# Patient Record
Sex: Female | Born: 1950 | Race: White | Hispanic: No | Marital: Married | Smoking: Former smoker
Health system: Southern US, Community
[De-identification: ages and names within clinical notes are randomized; demographics above are authoritative.]

## PROBLEM LIST (undated history)

## (undated) DIAGNOSIS — K219 Gastro-esophageal reflux disease without esophagitis: Secondary | ICD-10-CM

## (undated) DIAGNOSIS — E785 Hyperlipidemia, unspecified: Secondary | ICD-10-CM

## (undated) DIAGNOSIS — M810 Age-related osteoporosis without current pathological fracture: Secondary | ICD-10-CM

## (undated) DIAGNOSIS — E079 Disorder of thyroid, unspecified: Secondary | ICD-10-CM

## (undated) HISTORY — DX: Gastro-esophageal reflux disease without esophagitis: K21.9

## (undated) HISTORY — DX: Age-related osteoporosis without current pathological fracture: M81.0

## (undated) HISTORY — DX: Disorder of thyroid, unspecified: E07.9

## (undated) HISTORY — PX: HAND SURGERY: SHX662

## (undated) HISTORY — DX: Hyperlipidemia, unspecified: E78.5

---

## 2004-05-20 LAB — HM COLONOSCOPY

## 2007-09-09 ENCOUNTER — Ambulatory Visit: Payer: Self-pay | Admitting: Gastroenterology

## 2012-05-20 LAB — HM PAP SMEAR: HM Pap smear: NORMAL

## 2012-11-24 LAB — HM DEXA SCAN

## 2013-02-10 DIAGNOSIS — G5600 Carpal tunnel syndrome, unspecified upper limb: Secondary | ICD-10-CM | POA: Insufficient documentation

## 2013-02-10 DIAGNOSIS — R7989 Other specified abnormal findings of blood chemistry: Secondary | ICD-10-CM | POA: Insufficient documentation

## 2013-05-20 ENCOUNTER — Ambulatory Visit (INDEPENDENT_AMBULATORY_CARE_PROVIDER_SITE_OTHER): Payer: BC Managed Care – PPO | Admitting: Internal Medicine

## 2013-05-20 ENCOUNTER — Encounter: Payer: Self-pay | Admitting: Internal Medicine

## 2013-05-20 VITALS — BP 110/70 | HR 68 | Temp 98.1°F | Ht 66.0 in | Wt 143.0 lb

## 2013-05-20 DIAGNOSIS — E039 Hypothyroidism, unspecified: Secondary | ICD-10-CM

## 2013-05-20 DIAGNOSIS — E041 Nontoxic single thyroid nodule: Secondary | ICD-10-CM

## 2013-05-20 DIAGNOSIS — M810 Age-related osteoporosis without current pathological fracture: Secondary | ICD-10-CM | POA: Insufficient documentation

## 2013-05-20 DIAGNOSIS — Z23 Encounter for immunization: Secondary | ICD-10-CM

## 2013-05-20 DIAGNOSIS — E785 Hyperlipidemia, unspecified: Secondary | ICD-10-CM | POA: Insufficient documentation

## 2013-05-20 LAB — COMPREHENSIVE METABOLIC PANEL
ALT: 26 U/L (ref 0–35)
Alkaline Phosphatase: 37 U/L — ABNORMAL LOW (ref 39–117)
CO2: 26 mEq/L (ref 19–32)
Calcium: 9.8 mg/dL (ref 8.4–10.5)
Creatinine, Ser: 0.8 mg/dL (ref 0.4–1.2)
GFR: 81.89 mL/min (ref >60.00)
Glucose, Bld: 84 mg/dL (ref 70–99)
Sodium: 135 mEq/L (ref 135–145)
Total Bilirubin: 1.1 mg/dL (ref 0.3–1.2)
Total Protein: 7.4 g/dL (ref 6.0–8.3)

## 2013-05-20 LAB — T4, FREE: Free T4: 0.65 ng/dL (ref 0.60–1.60)

## 2013-05-20 LAB — LIPID PANEL
Cholesterol: 162 mg/dL (ref 0–200)
Total CHOL/HDL Ratio: 2
VLDL: 14.8 mg/dL (ref 0.0–40.0)

## 2013-05-20 LAB — TSH: TSH: 7.73 u[IU]/mL — ABNORMAL HIGH (ref 0.35–5.50)

## 2013-05-20 MED ORDER — ALENDRONATE SODIUM 70 MG PO TABS: 70.0000 mg | ORAL_TABLET | ORAL | Status: DC

## 2013-05-20 NOTE — Assessment & Plan Note (Signed)
Reviewed previous lipids from [facility] July 2014. Discussed current cholesterol guidelines including Mediterranean style diet and regular aerobic exercise 40 minutes 3 times per week. Given that she has been stable and doing well on simvastatin 40 mg daily, encouraged her to continue on this medication. Will recheck LFTs and lipids with labs today.

## 2013-05-20 NOTE — Assessment & Plan Note (Signed)
Reviewed previous TSH values from [facility] showing TSH between 6 and 10. Patient currently asymptomatic. Will recheck TSH with labs today.

## 2013-05-20 NOTE — Assessment & Plan Note (Signed)
Palpable thyroid nodule noted on exam today. Will get ultrasound for further evaluation.

## 2013-05-20 NOTE — Progress Notes (Signed)
  Subjective:    Patient ID: Shannon [name], female    DOB: October 02, 1950, 62 y.o.   MRN: [id number]  HPI 62 year old female with history of hyperlipidemia and osteoporosis presents to establish care. She reports that she is generally feeling well. She tries to follow a healthy diet and has been exercising by walking. She questions whether she needs to stay on simvastatin. Review of recent labs performed in July 2014 through [facility] showed cholesterol well controlled with total cholesterol less than 200. She denies any side effects on simvastatin.  She also notes a history of osteoporosis based on bone density testing in 2013. She was told to start on Boniva but has not yet filled this medication. She is taking a calcium and vitamin D supplement. She has never had fracture.  No outpatient prescriptions prior to visit.   No facility-administered medications prior to visit.     BP 110/70  Pulse 68  Temp(Src) 98.1 F (36.7 C) (Oral)  Ht 5\' 6"  (1.676 m)  Wt 143 lb (64.864 kg)  BMI 23.09 kg/m2  SpO2 97%  Review of Systems  Constitutional: Negative for fever, chills, appetite change, fatigue and unexpected weight change.  HENT: Negative for congestion, ear pain, sinus pressure, sore throat, trouble swallowing and voice change.   Eyes: Negative for visual disturbance.  Respiratory: Negative for cough, shortness of breath, wheezing and stridor.   Cardiovascular: Negative for chest pain, palpitations and leg swelling.  Gastrointestinal: Negative for nausea, vomiting, abdominal pain, diarrhea, constipation, blood in stool, abdominal distention and anal bleeding.  Genitourinary: Negative for dysuria and flank pain.  Musculoskeletal: Negative for arthralgias, gait problem, myalgias and neck pain.  Skin: Negative for color change and rash.  Neurological: Negative for dizziness and headaches.  Hematological: Negative for adenopathy. Does not bruise/bleed easily.  Psychiatric/Behavioral:  Negative for suicidal ideas, sleep disturbance and dysphoric mood. The patient is not nervous/anxious.        Objective:   Physical Exam  Constitutional: She is oriented to person, place, and time. She appears well-developed and well-nourished. No distress.  HENT:  Head: Normocephalic and atraumatic.  Right Ear: External ear normal.  Left Ear: External ear normal.  Nose: Nose normal.  Mouth/Throat: Oropharynx is clear and moist. No oropharyngeal exudate.  Eyes: Conjunctivae are normal. Pupils are equal, round, and reactive to light. Right eye exhibits no discharge. Left eye exhibits no discharge. No scleral icterus.  Neck: Normal range of motion. Neck supple. No tracheal deviation present. Mass present. No thyromegaly present.    Cardiovascular: Normal rate, regular rhythm, normal heart sounds and intact distal pulses.  Exam reveals no gallop and no friction rub.   No murmur heard. Pulmonary/Chest: Effort normal and breath sounds normal. No accessory muscle usage. Not tachypneic. No respiratory distress. She has no decreased breath sounds. She has no wheezes. She has no rhonchi. She has no rales. She exhibits no tenderness.  Musculoskeletal: Normal range of motion. She exhibits no edema and no tenderness.  Lymphadenopathy:    She has no cervical adenopathy.  Neurological: She is alert and oriented to person, place, and time. No cranial nerve deficit. She exhibits normal muscle tone. Coordination normal.  Skin: Skin is warm and dry. No rash noted. She is not diaphoretic. No erythema. No pallor.  Psychiatric: She has a normal mood and affect. Her behavior is normal. Judgment and thought content normal.          Assessment & Plan:

## 2013-05-20 NOTE — Assessment & Plan Note (Addendum)
Reviewed bone density testing from 2013 from [facility]. T score -2.5. Reviewed current recommendations regarding treatment of osteoporosis. Discussed getting adequate dietary calcium. Will check vitamin D level with labs today. Encouraged weight-bearing physical activity such as walking. Recommended starting alendronate 70 mg weekly. Discussed potential risk and benefits of this medication. Plan to repeat bone density testing in 2016.

## 2013-05-21 LAB — VITAMIN D 25 HYDROXY (VIT D DEFICIENCY, FRACTURES): Vit D, 25-Hydroxy: 51 ng/mL (ref 30–89)

## 2013-05-25 ENCOUNTER — Encounter: Payer: Self-pay | Admitting: Internal Medicine

## 2013-05-25 ENCOUNTER — Telehealth: Payer: Self-pay | Admitting: Internal Medicine

## 2013-05-25 DIAGNOSIS — E042 Nontoxic multinodular goiter: Secondary | ICD-10-CM

## 2013-05-25 NOTE — Telephone Encounter (Signed)
Thyroid US showed multinodular goiter with largest nodule left 2.5x1.4x2.4cm.

## 2013-05-27 ENCOUNTER — Ambulatory Visit (INDEPENDENT_AMBULATORY_CARE_PROVIDER_SITE_OTHER): Payer: BC Managed Care – PPO | Admitting: Internal Medicine

## 2013-05-27 ENCOUNTER — Encounter: Payer: Self-pay | Admitting: Internal Medicine

## 2013-05-27 VITALS — BP 108/64 | HR 69 | Temp 98.5°F | Resp 12 | Ht 66.0 in | Wt 143.0 lb

## 2013-05-27 DIAGNOSIS — E041 Nontoxic single thyroid nodule: Secondary | ICD-10-CM

## 2013-05-27 NOTE — Progress Notes (Addendum)
Patient ID: Shannon [name], female   DOB: 1950/09/21, 62 y.o.   MRN: [id number]  HPI  Shannon [name] is a 62 y.o.-year-old female, referred by her PCP, Dr.Walker, for evaluation for MNG.  Pt's PCP felt a right sided nodule at last visit >> Thyroid U/S (05/22/2013): heterogeneous gland, multinodular goiter with largest nodule in left lobe; 2.5 x 1.4 x 2.4 cm, solid, slightly echogenic, moderate internal vascularity, no calcifications. Largest nodule on the right: 1.4 x 0.9 x 1.0 cm, predominantly  isoechoic, with mild internal vascularity. No calcifications.   She also has a h/o hypothyroidism for the last at least 11 years ago >> started on levoxyl then >> but stopped subsequently as she did not feel different. Recent TSH levels from [facility] b/w 6-10 (per PCP note) and recent TSH at 7.7, with normal fT4.  I reviewed pt's most recent  thyroid tests: Lab Results  Component Value Date   TSH 7.73* 05/20/2013   FREET4 0.65 05/20/2013    Pt denies feeling nodules in neck, hoarseness, dysphagia/odynophagia, SOB with lying down.  Pt c/o: - no heat intolerance/+ cold intolerance - no tremors - no palpitations - no hyperdefecation/+ constipation - weight loss - weight gain - + very dry skin - + hair falling - no problems with concentration - no fatigue - no anxiety/no depression  No FH of thyroid ds. No FH of thyroid cancer. No h/o radiation tx to head or neck.  No seaweed or kelp, no recent contrast studies. No steroid use. No herbal supplements.   I reviewed her chart and she also has a history of osteoporosis - on Alendronate, HL - on Zocor.  ROS: Constitutional: no weight gain/loss, no fatigue, no subjective hyperthermia/hypothermia Eyes: no blurry vision, no xerophthalmia ENT: no sore throat, no nodules palpated in throat, no dysphagia/odynophagia, no hoarseness Cardiovascular: no CP/SOB/palpitations/leg swelling Respiratory: no cough/SOB Gastrointestinal: no N/V/D/C Musculoskeletal:  no muscle/joint aches Skin: no rashes Neurological: no tremors/numbness/tingling/dizziness Psychiatric: no depression/anxiety  Past Medical History  Diagnosis Date  . GERD (gastroesophageal reflux disease)   . Hyperlipidemia   . Thyroid disease    History reviewed. No pertinent past surgical history. History   Social History  . Marital Status: Married    Spouse Name: N/A    Number of Children: 1   Occupational History  . retail.   Social History Main Topics  . Smoking status: Former Smoker    Types: Cigarettes    Quit date: 01/18/1989  . Smokeless tobacco: Never Used  . Alcohol Use: 4.2 oz/week    7 Glasses of wine per week  . Drug Use: No  . Sexual Activity: Yes    Partners: Male   Social History Narrative   Lives in [place] with husband. 23YO son, lives in [place], Consulting civil engineer. Dog in home. Born in [place], raised in [place]. College [place]. Previously lived in [place].      Diet - regular diet      Exercise - walking   Caffeine: coffee in the morning; cup of hot tea      Hobbies - yardwork      Work - [employer] in [place]   Current Outpatient Prescriptions on File Prior to Visit  Medication Sig Dispense Refill  . alendronate (FOSAMAX) 70 MG tablet Take 1 tablet (70 mg total) by mouth every 7 (seven) days. Take with a full glass of water on an empty stomach.  4 tablet  11  . simvastatin (ZOCOR) 40 MG tablet Take 40 mg by  mouth every evening.       No current facility-administered medications on file prior to visit.   No Known Allergies Family History  Problem Relation Age of Onset  . Cancer Mother 1    Breast cancer  . Heart disease Father   . Cancer Father     Prostate  . Diabetes Father     diagnosed later in life  . Asthma Brother    PE: BP 108/64  Pulse 69  Temp(Src) 98.5 F (36.9 C) (Oral)  Resp 12  Ht 5\' 6"  (1.676 m)  Wt 143 lb (64.864 kg)  BMI 23.09 kg/m2  SpO2 97% Wt Readings from Last 3 Encounters:  05/27/13 143 lb (64.864 kg)  05/20/13 143  lb (64.864 kg)   Constitutional: overweight, in NAD Eyes: PERRLA, EOMI, no exophthalmos ENT: moist mucous membranes, tonsillomegaly, no thyromegaly, palpable R thyroid nodule on R no cervical lymphadenopathy Cardiovascular: RRR, No MRG Respiratory: CTA B Gastrointestinal: abdomen soft, NT, ND, BS+ Musculoskeletal: no deformities, strength intact in all 4;  Skin: moist, warm, no rashes Neurological: no tremor with outstretched hands, DTR normal in all 4  ASSESSMENT: 1. MNG - Thyroid U/S (05/22/2013): heterogeneous gland, multinodular goiter with largest nodule in left lobe; 2.5 x 1.4 x 2.4 cm, solid, slightly echogenic, moderate internal vascularity, no calcifications. Largest nodule on the right: 1.4 x 0.9 x 1.0 cm, predominantly  isoechoic, with mild internal vascularity. No calcifications.   1. MNG  - I reviewed the report of her thyroid ultrasound along with the patient. I pointed out that the dominant nodules are large, this being a risk factor for cancer. The TSh being higher than normal for years is also a risk for cancer. Otherwise, the nodules are iso- or hyperechoic, without calcifications, with mild-moderate internal blood flow, more wide than tall. Pt does not have a thyroid cancer family history or a personal history of RxTx to head/neck. All these would favor benignity. I believe her risk of cancer is less than 10-15%.  - the only way that we can tell exactly if it is cancer or not is by doing a thyroid biopsy (FNA). I explained what the test entails. - We discussed about other options, to wait for another year and see if the nodule grows, and only intervene at that time.  - I explained that this is not cancer, we can continue to follow her on a yearly basis, and check another ultrasound in another year or 2. - patient would like to think about the plan and let me know her decision after d/w husband - we discussed about the fact that a high TSH would stimulate nodule growth and we  decided to wait and recheck TSH at 6 months from the last check and start Levothyroxine if TSH higher. This can be done I would recommend a TSH in the normal range. - I'll see her back in a year, assuming her FNA is normal. If FNA abnormal, we will meet sooner.   06/12/2013 Adequacy Reason Satisfactory For Evaluation. Diagnosis THYROID, FINE NEEDLE ASPIRATION LEFT, (SPECIMEN 1 OF 2, COLLECTED ON 06/11/2013). 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 Jimmy Picket MD Pathologist, Electronic Signature (Case signed 06/12/2013) Specimen Clinical Information Nontoxic uninodular goiter, nodule, 2.5 x 1.4 x 2.4cm dominant left nodule   Adequacy Reason Satisfactory For Evaluation. Diagnosis THYROID, FINE NEEDLE ASPIRATION RIGHT, (SPECIMEN 2 OF 2, COLLECTED ON 11/20 2014). BENIGN. FINDINGS CONSISTENT WITH GOITER. FINDINGS CONSISTENT WITH THE CONTENTS OF A CYST. 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 This is not an entity described in the Bethesda criteria. I am not sure how to interpret this. The treatment plan for a follicular lesion would be to repeat FNA, while for follicular neoplasm would be surgical lobectomy.   I would discuss with the pathologist and then with the patient.

## 2013-05-27 NOTE — Patient Instructions (Signed)
Please return in 1 year. Please let me know about your decision for or against the FNA (fine needle aspiration) of your thyroid nodules.

## 2013-06-08 ENCOUNTER — Encounter: Payer: Self-pay | Admitting: Internal Medicine

## 2013-06-09 ENCOUNTER — Other Ambulatory Visit: Payer: Self-pay | Admitting: Internal Medicine

## 2013-06-09 ENCOUNTER — Encounter: Payer: Self-pay | Admitting: Internal Medicine

## 2013-06-09 DIAGNOSIS — E041 Nontoxic single thyroid nodule: Secondary | ICD-10-CM

## 2013-06-11 ENCOUNTER — Ambulatory Visit
Admission: RE | Admit: 2013-06-11 | Discharge: 2013-06-11 | Disposition: A | Discharge status: Home / Self Care | Payer: BC Managed Care – PPO | Source: Ambulatory Visit | Attending: Internal Medicine | Admitting: Internal Medicine

## 2013-06-11 ENCOUNTER — Other Ambulatory Visit (HOSPITAL_COMMUNITY)
Admission: RE | Admit: 2013-06-11 | Discharge: 2013-06-11 | Disposition: A | Discharge status: Home / Self Care | Payer: BC Managed Care – PPO | Source: Ambulatory Visit | Attending: Interventional Radiology | Admitting: Interventional Radiology

## 2013-06-11 ENCOUNTER — Encounter: Payer: Self-pay | Admitting: Internal Medicine

## 2013-06-11 DIAGNOSIS — M810 Age-related osteoporosis without current pathological fracture: Secondary | ICD-10-CM

## 2013-06-11 DIAGNOSIS — E041 Nontoxic single thyroid nodule: Secondary | ICD-10-CM | POA: Insufficient documentation

## 2013-06-11 NOTE — Progress Notes (Signed)
Pt had some discomfort/pain post thyroid FNA.  Went ahead and gave pt 500mg  tab po Tylenol at 950am.    Tammy, EMT 9:56am

## 2013-06-12 MED ORDER — SIMVASTATIN 40 MG PO TABS: 40.0000 mg | ORAL_TABLET | Freq: Every evening | ORAL | Status: DC

## 2013-06-12 MED ORDER — ALENDRONATE SODIUM 70 MG PO TABS: 70.0000 mg | ORAL_TABLET | ORAL | Status: DC

## 2013-06-15 ENCOUNTER — Other Ambulatory Visit: Payer: Self-pay | Admitting: Internal Medicine

## 2013-06-15 ENCOUNTER — Telehealth: Payer: Self-pay | Admitting: *Deleted

## 2013-06-15 ENCOUNTER — Encounter: Payer: Self-pay | Admitting: Internal Medicine

## 2013-06-15 DIAGNOSIS — E039 Hypothyroidism, unspecified: Secondary | ICD-10-CM

## 2013-06-15 NOTE — Telephone Encounter (Signed)
Yes, I called her earlier to discuss results of FNA.  I called her back now, explained that one of the thyroid nodules Bx's were indeterminate, but per pathologist (Dr Jimmy Picket) opinion, this was lower risk. I recommended that she comes for another appt in 09-05/2014 and we will need a new thyroid U/S and FNA then. She will also return in 11/2012 for TFTs and if TSH still high >> will start Levothyroxine then.

## 2013-06-15 NOTE — Telephone Encounter (Signed)
Pt called and lvm stating she was returning a call from our number. Pt asked for a return call, [phone]. Thank you.

## 2013-07-06 ENCOUNTER — Encounter: Payer: Self-pay | Admitting: Internal Medicine

## 2013-07-06 MED ORDER — POLYMYXIN B-TRIMETHOPRIM 10000-0.1 UNIT/ML-% OP SOLN: 1.0000 [drp] | Freq: Four times a day (QID) | OPHTHALMIC | Status: DC

## 2013-07-18 ENCOUNTER — Encounter: Payer: Self-pay | Admitting: Internal Medicine

## 2013-09-07 ENCOUNTER — Encounter: Payer: Self-pay | Admitting: Internal Medicine

## 2013-09-07 MED ORDER — CIPROFLOXACIN HCL 500 MG PO TABS: 500.0000 mg | ORAL_TABLET | Freq: Two times a day (BID) | ORAL | Status: DC

## 2013-09-08 NOTE — Telephone Encounter (Signed)
Per Phineas Real - sent to me by mistake.  Dr Gilford Rile has already adressed.

## 2013-11-09 ENCOUNTER — Ambulatory Visit: Payer: BC Managed Care – PPO | Admitting: Internal Medicine

## 2013-11-09 ENCOUNTER — Ambulatory Visit (INDEPENDENT_AMBULATORY_CARE_PROVIDER_SITE_OTHER): Payer: BC Managed Care – PPO | Admitting: Internal Medicine

## 2013-11-09 ENCOUNTER — Encounter: Payer: Self-pay | Admitting: Internal Medicine

## 2013-11-09 VITALS — BP 108/78 | HR 71 | Temp 98.4°F | Wt 139.0 lb

## 2013-11-09 DIAGNOSIS — R3915 Urgency of urination: Secondary | ICD-10-CM

## 2013-11-09 DIAGNOSIS — N390 Urinary tract infection, site not specified: Secondary | ICD-10-CM

## 2013-11-09 LAB — POCT URINALYSIS DIPSTICK
Bilirubin, UA: NEGATIVE
GLUCOSE UA: NEGATIVE
KETONES UA: NEGATIVE
Nitrite, UA: NEGATIVE
PH UA: 7
Spec Grav, UA: 1.02
Urobilinogen, UA: 0.2

## 2013-11-09 MED ORDER — CIPROFLOXACIN HCL 500 MG PO TABS: 500.0000 mg | ORAL_TABLET | Freq: Two times a day (BID) | ORAL | Status: DC

## 2013-11-09 NOTE — Patient Instructions (Signed)
Urinary Tract Infection  Urinary tract infections (UTIs) can develop anywhere along your urinary tract. Your urinary tract is your body's drainage system for removing wastes and extra water. Your urinary tract includes two kidneys, two ureters, a bladder, and a urethra. Your kidneys are a pair of bean-shaped organs. Each kidney is about the size of your fist. They are located below your ribs, one on each side of your spine.  CAUSES  Infections are caused by microbes, which are microscopic organisms, including fungi, viruses, and bacteria. These organisms are so small that they can only be seen through a microscope. Bacteria are the microbes that most commonly cause UTIs.  SYMPTOMS   Symptoms of UTIs may vary by age and gender of the patient and by the location of the infection. Symptoms in young women typically include a frequent and intense urge to urinate and a painful, burning feeling in the bladder or urethra during urination. Older women and men are more likely to be tired, shaky, and weak and have muscle aches and abdominal pain. A fever may mean the infection is in your kidneys. Other symptoms of a kidney infection include pain in your back or sides below the ribs, nausea, and vomiting.  DIAGNOSIS  To diagnose a UTI, your caregiver will ask you about your symptoms. Your caregiver also will ask to provide a urine sample. The urine sample will be tested for bacteria and white blood cells. White blood cells are made by your body to help fight infection.  TREATMENT   Typically, UTIs can be treated with medication. Because most UTIs are caused by a bacterial infection, they usually can be treated with the use of antibiotics. The choice of antibiotic and length of treatment depend on your symptoms and the type of bacteria causing your infection.  HOME CARE INSTRUCTIONS   If you were prescribed antibiotics, take them exactly as your caregiver instructs you. Finish the medication even if you feel better after you  have only taken some of the medication.   Drink enough water and fluids to keep your urine clear or pale yellow.   Avoid caffeine, tea, and carbonated beverages. They tend to irritate your bladder.   Empty your bladder often. Avoid holding urine for long periods of time.   Empty your bladder before and after sexual intercourse.   After a bowel movement, women should cleanse from front to back. Use each tissue only once.  SEEK MEDICAL CARE IF:    You have back pain.   You develop a fever.   Your symptoms do not begin to resolve within 3 days.  SEEK IMMEDIATE MEDICAL CARE IF:    You have severe back pain or lower abdominal pain.   You develop chills.   You have nausea or vomiting.   You have continued burning or discomfort with urination.  MAKE SURE YOU:    Understand these instructions.   Will watch your condition.   Will get help right away if you are not doing well or get worse.  Document Released: 04/18/2005 Document Revised: 01/08/2012 Document Reviewed: 08/17/2011  Exit[employer] Patient Information 2014 [employer].

## 2013-11-09 NOTE — Progress Notes (Signed)
   Subjective:    Patient ID: Shannon [name], female    DOB: March 13, 1951, 63 y.o.   MRN: [id number]  HPI 63YO female presents for acute visit. Started having urinary urgency and dysuria this morning. Had intercourse this weekend. No fever, chills, gross hematuria.  Review of Systems  Constitutional: Negative for fever, chills and fatigue.  Gastrointestinal: Negative for nausea, vomiting, abdominal pain, diarrhea, constipation and rectal pain.  Genitourinary: Positive for dysuria, urgency and frequency. Negative for hematuria, flank pain, decreased urine volume, vaginal bleeding, vaginal discharge, difficulty urinating, vaginal pain and pelvic pain.       Objective:    BP 108/78  Pulse 71  Temp(Src) 98.4 F (36.9 C) (Oral)  Wt 139 lb (63.05 kg)  SpO2 97% Physical Exam  Constitutional: She is oriented to person, place, and time. She appears well-developed and well-nourished. No distress.  HENT:  Head: Normocephalic and atraumatic.  Right Ear: External ear normal.  Left Ear: External ear normal.  Nose: Nose normal.  Mouth/Throat: Oropharynx is clear and moist. No oropharyngeal exudate.  Eyes: Conjunctivae are normal. Pupils are equal, round, and reactive to light. Right eye exhibits no discharge. Left eye exhibits no discharge. No scleral icterus.  Neck: Normal range of motion. Neck supple. No tracheal deviation present. No thyromegaly present.  Cardiovascular: Normal rate, regular rhythm, normal heart sounds and intact distal pulses.  Exam reveals no gallop and no friction rub.   No murmur heard. Pulmonary/Chest: Effort normal and breath sounds normal. No accessory muscle usage. Not tachypneic. No respiratory distress. She has no decreased breath sounds. She has no wheezes. She has no rhonchi. She has no rales. She exhibits no tenderness.  Abdominal: There is no tenderness (no CVA tenderness).  Musculoskeletal: Normal range of motion. She exhibits no edema and no tenderness.    Lymphadenopathy:    She has no cervical adenopathy.  Neurological: She is alert and oriented to person, place, and time. No cranial nerve deficit. She exhibits normal muscle tone. Coordination normal.  Skin: Skin is warm and dry. No rash noted. She is not diaphoretic. No erythema. No pallor.  Psychiatric: She has a normal mood and affect. Her behavior is normal. Judgment and thought content normal.          Assessment & Plan:   Problem List Items Addressed This Visit   UTI (urinary tract infection) - Primary     Symptoms and exam consistent with UTI. Will start Cipro. Will send urine for culture. Azo prn pain. Follow up if symptoms are not improving over next 48hr.    Relevant Medications      ciprofloxacin (CIPRO) tablet    Other Visit Diagnoses   Urinary urgency        Relevant Orders       POCT Urinalysis Dipstick (Completed)       Urine culture        Return if symptoms worsen or fail to improve.

## 2013-11-09 NOTE — Assessment & Plan Note (Signed)
Symptoms and exam consistent with UTI. Will start Cipro. Will send urine for culture. Azo prn pain. Follow up if symptoms are not improving over next 48hr.

## 2013-11-09 NOTE — Progress Notes (Signed)
Pre visit review using our clinic review tool, if applicable. No additional management support is needed unless otherwise documented below in the visit note. 

## 2013-11-10 LAB — URINE CULTURE
COLONY COUNT: NO GROWTH
ORGANISM ID, BACTERIA: NO GROWTH

## 2013-11-24 ENCOUNTER — Encounter: Payer: Self-pay | Admitting: Internal Medicine

## 2013-11-24 ENCOUNTER — Ambulatory Visit (INDEPENDENT_AMBULATORY_CARE_PROVIDER_SITE_OTHER): Payer: BC Managed Care – PPO | Admitting: Internal Medicine

## 2013-11-24 VITALS — BP 102/62 | HR 67 | Temp 97.8°F | Resp 14 | Ht 66.0 in | Wt 139.5 lb

## 2013-11-24 DIAGNOSIS — E041 Nontoxic single thyroid nodule: Secondary | ICD-10-CM

## 2013-11-24 DIAGNOSIS — Z0001 Encounter for general adult medical examination with abnormal findings: Secondary | ICD-10-CM | POA: Insufficient documentation

## 2013-11-24 DIAGNOSIS — Z1239 Encounter for other screening for malignant neoplasm of breast: Secondary | ICD-10-CM

## 2013-11-24 DIAGNOSIS — Z0000 Encounter for general adult medical examination without abnormal findings: Secondary | ICD-10-CM | POA: Insufficient documentation

## 2013-11-24 LAB — CBC WITH DIFFERENTIAL/PLATELET
BASOS ABS: 0 10*3/uL (ref 0.0–0.1)
Basophils Relative: 0.5 % (ref 0.0–3.0)
Eosinophils Absolute: 0.2 10*3/uL (ref 0.0–0.7)
Eosinophils Relative: 3.3 % (ref 0.0–5.0)
HCT: 36.6 % (ref 36.0–46.0)
Hemoglobin: 12.6 g/dL (ref 12.0–15.0)
Lymphocytes Relative: 20.3 % (ref 12.0–46.0)
Lymphs Abs: 1.1 10*3/uL (ref 0.7–4.0)
MCHC: 34.3 g/dL (ref 30.0–36.0)
MCV: 98.2 fl (ref 78.0–100.0)
MONOS PCT: 8.6 % (ref 3.0–12.0)
Monocytes Absolute: 0.5 10*3/uL (ref 0.1–1.0)
NEUTROS PCT: 67.3 % (ref 43.0–77.0)
Neutro Abs: 3.6 10*3/uL (ref 1.4–7.7)
PLATELETS: 268 10*3/uL (ref 150.0–400.0)
RBC: 3.73 Mil/uL — ABNORMAL LOW (ref 3.87–5.11)
RDW: 14 % (ref 11.5–15.5)
WBC: 5.3 10*3/uL (ref 4.0–10.5)

## 2013-11-24 LAB — COMPREHENSIVE METABOLIC PANEL
ALBUMIN: 4.3 g/dL (ref 3.5–5.2)
ALK PHOS: 33 U/L — AB (ref 39–117)
ALT: 28 U/L (ref 0–35)
AST: 37 U/L (ref 0–37)
BILIRUBIN TOTAL: 0.9 mg/dL (ref 0.2–1.2)
BUN: 20 mg/dL (ref 6–23)
CO2: 26 mEq/L (ref 19–32)
Calcium: 9.4 mg/dL (ref 8.4–10.5)
Chloride: 103 mEq/L (ref 96–112)
Creatinine, Ser: 0.8 mg/dL (ref 0.4–1.2)
GFR: 83.01 mL/min (ref >60.00)
GLUCOSE: 78 mg/dL (ref 70–99)
POTASSIUM: 4.7 meq/L (ref 3.5–5.1)
SODIUM: 137 meq/L (ref 135–145)
TOTAL PROTEIN: 6.9 g/dL (ref 6.0–8.3)

## 2013-11-24 LAB — T4, FREE: FREE T4: 0.61 ng/dL (ref 0.60–1.60)

## 2013-11-24 LAB — LIPID PANEL
CHOLESTEROL: 155 mg/dL (ref 0–200)
HDL: 68.5 mg/dL (ref >39.00)
LDL Cholesterol: 72 mg/dL (ref 0–99)
Total CHOL/HDL Ratio: 2
Triglycerides: 73 mg/dL (ref 0.0–149.0)
VLDL: 14.6 mg/dL (ref 0.0–40.0)

## 2013-11-24 LAB — T3, FREE: T3, Free: 2.9 pg/mL (ref 2.3–4.2)

## 2013-11-24 LAB — TSH: TSH: 8.35 u[IU]/mL — ABNORMAL HIGH (ref 0.35–4.50)

## 2013-11-24 LAB — MICROALBUMIN / CREATININE URINE RATIO
CREATININE, U: 96.4 mg/dL
MICROALB UR: 2.1 mg/dL — AB (ref 0.0–1.9)
MICROALB/CREAT RATIO: 2.2 mg/g (ref 0.0–30.0)

## 2013-11-24 MED ORDER — SIMVASTATIN 40 MG PO TABS: 40.0000 mg | ORAL_TABLET | Freq: Every evening | ORAL | Status: DC

## 2013-11-24 NOTE — Progress Notes (Signed)
Subjective:    Patient ID: Shannon [name], female    DOB: 1950/08/05, 63 y.o.   MRN: [id number]  HPI 63YO female presents for annual exam. Doing well. No concerns today. Trying to follow a healthy, Mediterranean style diet. Stays physically active. Due for mammogram.   Review of Systems  Constitutional: Negative for fever, chills, appetite change, fatigue and unexpected weight change.  HENT: Negative for congestion, ear pain, sinus pressure, sore throat, trouble swallowing and voice change.   Eyes: Negative for visual disturbance.  Respiratory: Negative for cough, shortness of breath, wheezing and stridor.   Cardiovascular: Negative for chest pain, palpitations and leg swelling.  Gastrointestinal: Negative for nausea, vomiting, abdominal pain, diarrhea, constipation, blood in stool, abdominal distention and anal bleeding.  Genitourinary: Negative for dysuria and flank pain.  Musculoskeletal: Negative for arthralgias, gait problem, myalgias and neck pain.  Skin: Negative for color change and rash.  Neurological: Negative for dizziness and headaches.  Hematological: Negative for adenopathy. Does not bruise/bleed easily.  Psychiatric/Behavioral: Negative for suicidal ideas, sleep disturbance and dysphoric mood. The patient is not nervous/anxious.        Objective:    BP 102/62  Pulse 67  Temp(Src) 97.8 F (36.6 C) (Oral)  Resp 14  Ht 5\' 6"  (1.676 m)  Wt 139 lb 8 oz (63.277 kg)  BMI 22.53 kg/m2  SpO2 97% Physical Exam  Constitutional: She is oriented to person, place, and time. She appears well-developed and well-nourished. No distress.  HENT:  Head: Normocephalic and atraumatic.  Right Ear: External ear normal.  Left Ear: External ear normal.  Nose: Nose normal.  Mouth/Throat: Oropharynx is clear and moist. No oropharyngeal exudate.  Eyes: Conjunctivae are normal. Pupils are equal, round, and reactive to light. Right eye exhibits no discharge. Left eye exhibits no discharge.  No scleral icterus.  Neck: Normal range of motion. Neck supple. No tracheal deviation present. No thyromegaly present.  Cardiovascular: Normal rate, regular rhythm, normal heart sounds and intact distal pulses.  Exam reveals no gallop and no friction rub.   No murmur heard. Pulmonary/Chest: Effort normal and breath sounds normal. No accessory muscle usage. Not tachypneic. No respiratory distress. She has no decreased breath sounds. She has no wheezes. She has no rales. She exhibits no tenderness. Right breast exhibits no inverted nipple, no mass, no nipple discharge, no skin change and no tenderness. Left breast exhibits no inverted nipple, no mass, no nipple discharge, no skin change and no tenderness. Breasts are symmetrical.  Abdominal: Soft. Bowel sounds are normal. She exhibits no distension and no mass. There is no tenderness. There is no rebound and no guarding.  Musculoskeletal: Normal range of motion. She exhibits no edema and no tenderness.  Lymphadenopathy:    She has no cervical adenopathy.  Neurological: She is alert and oriented to person, place, and time. No cranial nerve deficit. She exhibits normal muscle tone. Coordination normal.  Skin: Skin is warm and dry. No rash noted. She is not diaphoretic. No erythema. No pallor.  Psychiatric: She has a normal mood and affect. Her behavior is normal. Judgment and thought content normal.          Assessment & Plan:   Problem List Items Addressed This Visit   Routine general medical examination at a health care facility - Primary     General medical exam normal today including breast exam. PAP and pelvic deferred as PAP normal 2013. Will plan repeat PAP in 2016. Mammogram ordered. Colonoscopy UTD. Immunizations UTD. Labs  today including CBC, CMP, lipids, TSH.    Relevant Orders      CBC with Differential      Comprehensive metabolic panel      Lipid panel      Microalbumin / creatinine urine ratio      Vit D  25 hydroxy (rtn  osteoporosis monitoring)   Screening for breast cancer   Relevant Orders      MM Digital Screening   Thyroid nodule   Relevant Orders      TSH      T4, free      T3, free       Return in about 6 months (around 05/27/2014).

## 2013-11-24 NOTE — Progress Notes (Signed)
Pre visit review using our clinic review tool, if applicable. No additional management support is needed unless otherwise documented below in the visit note. 

## 2013-11-24 NOTE — Assessment & Plan Note (Signed)
General medical exam normal today including breast exam. PAP and pelvic deferred as PAP normal 2013. Will plan repeat PAP in 2016. Mammogram ordered. Colonoscopy UTD. Immunizations UTD. Labs today including CBC, CMP, lipids, TSH.

## 2013-11-25 ENCOUNTER — Other Ambulatory Visit: Payer: Self-pay | Admitting: Internal Medicine

## 2013-11-25 DIAGNOSIS — E039 Hypothyroidism, unspecified: Secondary | ICD-10-CM

## 2013-11-25 LAB — VITAMIN D 25 HYDROXY (VIT D DEFICIENCY, FRACTURES): Vit D, 25-Hydroxy: 39 ng/mL (ref 30–89)

## 2013-11-25 MED ORDER — LEVOTHYROXINE SODIUM 25 MCG PO TABS: 25.0000 ug | ORAL_TABLET | Freq: Every day | ORAL | Status: DC

## 2013-11-27 ENCOUNTER — Encounter: Payer: Self-pay | Admitting: *Deleted

## 2013-12-30 ENCOUNTER — Ambulatory Visit: Payer: Self-pay | Admitting: Internal Medicine

## 2013-12-30 LAB — HM MAMMOGRAPHY: HM Mammogram: NEGATIVE

## 2014-01-07 ENCOUNTER — Encounter: Payer: Self-pay | Admitting: *Deleted

## 2014-02-23 ENCOUNTER — Encounter: Payer: Self-pay | Admitting: Internal Medicine

## 2014-02-24 ENCOUNTER — Other Ambulatory Visit (INDEPENDENT_AMBULATORY_CARE_PROVIDER_SITE_OTHER): Payer: BC Managed Care – PPO

## 2014-02-24 DIAGNOSIS — E039 Hypothyroidism, unspecified: Secondary | ICD-10-CM

## 2014-02-24 LAB — T4, FREE: FREE T4: 1.18 ng/dL (ref 0.60–1.60)

## 2014-02-24 LAB — TSH: TSH: 3.3 u[IU]/mL (ref 0.35–4.50)

## 2014-02-25 ENCOUNTER — Encounter: Payer: Self-pay | Admitting: Internal Medicine

## 2014-06-02 ENCOUNTER — Ambulatory Visit (INDEPENDENT_AMBULATORY_CARE_PROVIDER_SITE_OTHER): Payer: BC Managed Care – PPO | Admitting: *Deleted

## 2014-06-02 ENCOUNTER — Encounter: Payer: Self-pay | Admitting: Internal Medicine

## 2014-06-02 ENCOUNTER — Ambulatory Visit (INDEPENDENT_AMBULATORY_CARE_PROVIDER_SITE_OTHER): Payer: BC Managed Care – PPO | Admitting: Internal Medicine

## 2014-06-02 VITALS — BP 102/62 | HR 69 | Temp 98.0°F | Ht 66.0 in | Wt 140.8 lb

## 2014-06-02 DIAGNOSIS — Z23 Encounter for immunization: Secondary | ICD-10-CM

## 2014-06-02 DIAGNOSIS — E041 Nontoxic single thyroid nodule: Secondary | ICD-10-CM

## 2014-06-02 DIAGNOSIS — E039 Hypothyroidism, unspecified: Secondary | ICD-10-CM

## 2014-06-02 DIAGNOSIS — L659 Nonscarring hair loss, unspecified: Secondary | ICD-10-CM | POA: Insufficient documentation

## 2014-06-02 DIAGNOSIS — E785 Hyperlipidemia, unspecified: Secondary | ICD-10-CM

## 2014-06-02 DIAGNOSIS — Z1211 Encounter for screening for malignant neoplasm of colon: Secondary | ICD-10-CM

## 2014-06-02 LAB — CBC WITH DIFFERENTIAL/PLATELET
Basophils Absolute: 0 10*3/uL (ref 0.0–0.1)
Basophils Relative: 0.5 % (ref 0.0–3.0)
EOS PCT: 3.2 % (ref 0.0–5.0)
Eosinophils Absolute: 0.1 10*3/uL (ref 0.0–0.7)
HCT: 38 % (ref 36.0–46.0)
Hemoglobin: 13 g/dL (ref 12.0–15.0)
Lymphocytes Relative: 26 % (ref 12.0–46.0)
Lymphs Abs: 1.2 10*3/uL (ref 0.7–4.0)
MCHC: 34.2 g/dL (ref 30.0–36.0)
MCV: 96.9 fl (ref 78.0–100.0)
MONOS PCT: 8.3 % (ref 3.0–12.0)
Monocytes Absolute: 0.4 10*3/uL (ref 0.1–1.0)
NEUTROS PCT: 62 % (ref 43.0–77.0)
Neutro Abs: 2.8 10*3/uL (ref 1.4–7.7)
Platelets: 275 10*3/uL (ref 150.0–400.0)
RBC: 3.92 Mil/uL (ref 3.87–5.11)
RDW: 13.3 % (ref 11.5–15.5)
WBC: 4.5 10*3/uL (ref 4.0–10.5)

## 2014-06-02 LAB — LIPID PANEL
CHOLESTEROL: 178 mg/dL (ref 0–200)
HDL: 66.8 mg/dL (ref >39.00)
LDL CALC: 82 mg/dL (ref 0–99)
NonHDL: 111.2
Total CHOL/HDL Ratio: 3
Triglycerides: 144 mg/dL (ref 0.0–149.0)
VLDL: 28.8 mg/dL (ref 0.0–40.0)

## 2014-06-02 LAB — COMPREHENSIVE METABOLIC PANEL
ALBUMIN: 3.9 g/dL (ref 3.5–5.2)
ALK PHOS: 38 U/L — AB (ref 39–117)
ALT: 40 U/L — ABNORMAL HIGH (ref 0–35)
AST: 38 U/L — ABNORMAL HIGH (ref 0–37)
BUN: 20 mg/dL (ref 6–23)
CALCIUM: 9.6 mg/dL (ref 8.4–10.5)
CHLORIDE: 103 meq/L (ref 96–112)
CO2: 20 meq/L (ref 19–32)
Creatinine, Ser: 0.8 mg/dL (ref 0.4–1.2)
GFR: 73.72 mL/min (ref >60.00)
Glucose, Bld: 93 mg/dL (ref 70–99)
POTASSIUM: 4.5 meq/L (ref 3.5–5.1)
Sodium: 135 mEq/L (ref 135–145)
TOTAL PROTEIN: 7.5 g/dL (ref 6.0–8.3)
Total Bilirubin: 1.2 mg/dL (ref 0.2–1.2)

## 2014-06-02 LAB — T4, FREE: Free T4: 0.78 ng/dL (ref 0.60–1.60)

## 2014-06-02 LAB — TSH: TSH: 7.34 u[IU]/mL — AB (ref 0.35–4.50)

## 2014-06-02 LAB — FERRITIN: FERRITIN: 219.4 ng/mL (ref 10.0–291.0)

## 2014-06-02 NOTE — Assessment & Plan Note (Signed)
TSH and T4 with labs today. Continue Levothyroxine.

## 2014-06-02 NOTE — Progress Notes (Signed)
Subjective:    Patient ID: Shannon [name], female    DOB: 01/07/1951, 63 y.o.   MRN: [id number]  HPI 63YO female presents for follow up.  Thyroid nodule - has not yet had follow up for left thyroid nodule which had possible finding of follicular neoplasm on FNA in 05/2013. No pain or change in site noted by pt.  Generally feeling well. No concerns today except for longstanding thinning of hair. No recent change in symptoms.   Review of Systems  Constitutional: Negative for fever, chills, appetite change, fatigue and unexpected weight change.  Eyes: Negative for visual disturbance.  Respiratory: Negative for shortness of breath.   Cardiovascular: Negative for chest pain and leg swelling.  Gastrointestinal: Negative for nausea, vomiting, abdominal pain, diarrhea and constipation.  Endocrine: Negative for heat intolerance.  Musculoskeletal: Negative for neck pain.  Skin: Negative for color change and rash.  Hematological: Negative for adenopathy. Does not bruise/bleed easily.  Psychiatric/Behavioral: Negative for dysphoric mood. The patient is not nervous/anxious.        Objective:    BP 102/62 mmHg  Pulse 69  Temp(Src) 98 F (36.7 C) (Oral)  Ht 5\' 6"  (1.676 m)  Wt 140 lb 12 oz (63.844 kg)  BMI 22.73 kg/m2  SpO2 98% Physical Exam  Constitutional: She is oriented to person, place, and time. She appears well-developed and well-nourished. No distress.  HENT:  Head: Normocephalic and atraumatic.  Right Ear: External ear normal.  Left Ear: External ear normal.  Nose: Nose normal.  Mouth/Throat: Oropharynx is clear and moist. No oropharyngeal exudate.  Eyes: Conjunctivae are normal. Pupils are equal, round, and reactive to light. Right eye exhibits no discharge. Left eye exhibits no discharge. No scleral icterus.  Neck: Normal range of motion. Neck supple. No tracheal deviation present. No thyromegaly present.  Cardiovascular: Normal rate, regular rhythm, normal heart sounds  and intact distal pulses.  Exam reveals no gallop and no friction rub.   No murmur heard. Pulmonary/Chest: Effort normal and breath sounds normal. No accessory muscle usage. No tachypnea. No respiratory distress. She has no decreased breath sounds. She has no wheezes. She has no rhonchi. She has no rales. She exhibits no tenderness.  Musculoskeletal: Normal range of motion. She exhibits no edema or tenderness.  Lymphadenopathy:    She has no cervical adenopathy.  Neurological: She is alert and oriented to person, place, and time. No cranial nerve deficit. She exhibits normal muscle tone. Coordination normal.  Skin: Skin is warm and dry. No rash noted. She is not diaphoretic. No erythema. No pallor.  Psychiatric: She has a normal mood and affect. Her behavior is normal. Judgment and thought content normal.          Assessment & Plan:   Problem List Items Addressed This Visit      Unprioritized   Hair loss    Discussed potential causes of hair loss. Will check CBC, CMP, TSH with labs today.    Relevant Orders      CBC with Differential      Ferritin   Hyperlipidemia - Primary    Will check lipids and LFTs with labs today. Continue Simvastatin.    Relevant Orders      Comprehensive metabolic panel      Lipid panel   Hypothyroidism    TSH and T4 with labs today. Continue Levothyroxine.    Relevant Orders      T4, free      TSH   Thyroid nodule  Previous FNA left thyroid nodule questioned follicular neoplasm. Will set up follow up with Dr. Cruzita Lederer for possible repeat FNA.     Other Visit Diagnoses    Special screening for malignant neoplasms, colon        Relevant Orders       Ambulatory referral to Gastroenterology        Return in about 6 months (around 12/01/2014) for Physical.

## 2014-06-02 NOTE — Progress Notes (Signed)
Pre visit review using our clinic review tool, if applicable. No additional management support is needed unless otherwise documented below in the visit note. 

## 2014-06-02 NOTE — Assessment & Plan Note (Signed)
Discussed potential causes of hair loss. Will check CBC, CMP, TSH with labs today.

## 2014-06-02 NOTE — Assessment & Plan Note (Signed)
Previous FNA left thyroid nodule questioned follicular neoplasm. Will set up follow up with Dr. Cruzita Lederer for possible repeat FNA.

## 2014-06-02 NOTE — Assessment & Plan Note (Signed)
Will check lipids and LFTs  with labs today. Continue Simvastatin. 

## 2014-06-02 NOTE — Patient Instructions (Signed)
Labs today.  Follow up in 6 months. 

## 2014-06-18 ENCOUNTER — Other Ambulatory Visit: Payer: Self-pay | Admitting: Internal Medicine

## 2014-07-23 HISTORY — PX: COLOSTOMY: SHX63

## 2014-08-27 ENCOUNTER — Other Ambulatory Visit: Payer: Self-pay | Admitting: Internal Medicine

## 2014-08-27 NOTE — Telephone Encounter (Signed)
Needs labs before further refills

## 2014-10-01 ENCOUNTER — Ambulatory Visit: Payer: Self-pay | Admitting: Unknown Physician Specialty

## 2014-10-01 LAB — HM COLONOSCOPY

## 2014-11-04 ENCOUNTER — Other Ambulatory Visit: Payer: Self-pay | Admitting: Internal Medicine

## 2014-11-04 DIAGNOSIS — E039 Hypothyroidism, unspecified: Secondary | ICD-10-CM

## 2014-11-04 MED ORDER — LEVOTHYROXINE SODIUM 25 MCG PO TABS: ORAL_TABLET | ORAL | Status: DC

## 2014-11-08 ENCOUNTER — Encounter: Payer: Self-pay | Admitting: Internal Medicine

## 2014-11-15 LAB — SURGICAL PATHOLOGY

## 2014-12-16 ENCOUNTER — Telehealth: Payer: Self-pay | Admitting: Internal Medicine

## 2014-12-16 MED ORDER — LEVOTHYROXINE SODIUM 25 MCG PO TABS: ORAL_TABLET | ORAL | Status: DC

## 2014-12-16 NOTE — Telephone Encounter (Signed)
Patient called and would like a refill on her medication   Rx: Levothyroxine  [facility]   Appointment is: 7.28.16   Thank you

## 2014-12-16 NOTE — Telephone Encounter (Signed)
Done

## 2014-12-21 ENCOUNTER — Ambulatory Visit (INDEPENDENT_AMBULATORY_CARE_PROVIDER_SITE_OTHER): Payer: BLUE CROSS/BLUE SHIELD | Admitting: Podiatry

## 2014-12-21 ENCOUNTER — Ambulatory Visit (INDEPENDENT_AMBULATORY_CARE_PROVIDER_SITE_OTHER): Payer: BLUE CROSS/BLUE SHIELD

## 2014-12-21 ENCOUNTER — Encounter: Payer: Self-pay | Admitting: Podiatry

## 2014-12-21 VITALS — Ht 66.0 in | Wt 138.0 lb

## 2014-12-21 DIAGNOSIS — M21612 Bunion of left foot: Secondary | ICD-10-CM

## 2014-12-21 DIAGNOSIS — M2012 Hallux valgus (acquired), left foot: Secondary | ICD-10-CM | POA: Diagnosis not present

## 2014-12-21 DIAGNOSIS — M205X1 Other deformities of toe(s) (acquired), right foot: Secondary | ICD-10-CM | POA: Diagnosis not present

## 2014-12-21 NOTE — Progress Notes (Signed)
   Subjective:    Patient ID: Shannon [name], female    DOB: June 06, 1951, 64 y.o.   MRN: [id number]  HPI 64 year old female presents the office today complains of bilateral bunions of the right side worse than left. She states that she has some intermittent discomfort of the right side protected with certain shoes or after she's been on her feet for a period time. She denies any history of injury or trauma. She denies any swelling or redness overlying the bunion sites. She does state that they do cause pressure in shoes causing irritation. She does that she has pain with range of motion of the first big toe joint on the right side. She doesn't have much discomfort to the left side. No other complaints at this time.   Review of Systems  All other systems reviewed and are negative.      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 There is no pain with range of motion of the left first MTPJ although there is a small amount of crepitation present. On the right side there is a decreased range of motion of the first MTPJ with crepitation. There issignificant pain with first MTPJ range of motion have there is tenderness overlying the prominent dorsal medial exostosis off the first metatarsal head. There is no edema, erythema, increase in warmth. No hypermobility bilaterally. No other areas of tenderness to bilateral lower extremities. MMT 5/5, ROM WNL.  No open lesions or pre-ulcerative lesions.  No overlying edema, erythema, increase in warmth to bilateral lower extremities.  No pain with calf compression, swelling, warmth, erythema bilaterally.      Assessment & Plan:   64 year old female with  symptomatic right hallux limitus  -X-rays were obtained and reviewed with the patient.  -Treatment options discussed including all alternatives, risks, and complications -Discussed conservative treatment the patient including orthotics with a Morton's extension however she states it is more a Band-Aid fixing does not want to proceed with custom orthotics. Also discussed injections however she wishes to hold off. -I discussed surgical intervention to include an arthroplasty of the right first MTPJ with implant. She states that she'll likely pursue the surgery in the future however she would like to talk to her husband about scheduling later in the summer.  -Follow-up as needed. In the meantime call the office with any questions, concerns, change in symptoms.

## 2015-01-28 ENCOUNTER — Other Ambulatory Visit: Payer: Self-pay | Admitting: Internal Medicine

## 2015-02-02 ENCOUNTER — Encounter: Payer: Self-pay | Admitting: Internal Medicine

## 2015-02-03 ENCOUNTER — Other Ambulatory Visit: Payer: Self-pay | Admitting: *Deleted

## 2015-02-03 DIAGNOSIS — Z0000 Encounter for general adult medical examination without abnormal findings: Secondary | ICD-10-CM

## 2015-02-08 ENCOUNTER — Ambulatory Visit: Payer: Self-pay | Admitting: Internal Medicine

## 2015-02-10 ENCOUNTER — Encounter: Payer: Self-pay | Admitting: Internal Medicine

## 2015-02-10 ENCOUNTER — Other Ambulatory Visit: Payer: Self-pay | Admitting: *Deleted

## 2015-02-10 MED ORDER — LEVOTHYROXINE SODIUM 25 MCG PO TABS: ORAL_TABLET | ORAL | Status: DC

## 2015-02-17 ENCOUNTER — Encounter: Payer: Self-pay | Admitting: Internal Medicine

## 2015-02-17 ENCOUNTER — Ambulatory Visit (INDEPENDENT_AMBULATORY_CARE_PROVIDER_SITE_OTHER): Payer: BLUE CROSS/BLUE SHIELD | Admitting: Internal Medicine

## 2015-02-17 VITALS — BP 118/78 | HR 71 | Temp 97.9°F | Ht 66.0 in | Wt 142.0 lb

## 2015-02-17 DIAGNOSIS — E041 Nontoxic single thyroid nodule: Secondary | ICD-10-CM | POA: Diagnosis not present

## 2015-02-17 NOTE — Patient Instructions (Signed)
Please schedule the new thyroid U/S with Annasha.  Please return in 1 year.

## 2015-02-17 NOTE — Progress Notes (Addendum)
Patient ID: Shannon [name], female   DOB: May 06, 1951, 64 y.o.   MRN: [id number]  HPI f/u for  Shannon [name] is a 64 y.o.-year-old female, returning for f/u for MNG and hypothyroidism.  Reviewed hx: Pt's PCP felt a right sided nodule at [facility] in 2014 >> Thyroid U/S (05/22/2013): heterogeneous gland, multinodular goiter with largest nodule in left lobe; 2.5 x 1.4 x 2.4 cm, solid, slightly echogenic, moderate internal vascularity, no calcifications. Largest nodule on the right: 1.4 x 0.9 x 1.0 cm, predominantly  isoechoic, with mild internal vascularity. No calcifications.   We biopsied both nodules after last visit (05/2013): The 2.5 cm nodule returned as FLUS, while the 1.4 cm nodule returned benign.  At that time, I discussed with the patient about what the result of FLUS means and suggested to have another biopsy in 6-12 months. Patient did not return for follow-up until now.   Pt denies feeling nodules in neck, hoarseness, dysphagia/odynophagia, SOB with lying down.  She also has a h/o mild hypothyroidism dx ~2003 >> started on levoxyl then >> but stopped subsequently as she did not feel different. She restarted LT4 since last visit, now on 25 mcg daily. This is managed by her PCP. She will have an appointment with Dr. Gilford Rile in several days.  I reviewed pt's most recent  thyroid tests: Lab Results  Component Value Date   TSH 7.34* 06/02/2014   TSH 3.30 02/24/2014   TSH 8.35* 11/24/2013   TSH 7.73* 05/20/2013   FREET4 0.78 06/02/2014   FREET4 1.18 02/24/2014   FREET4 0.61 11/24/2013   FREET4 0.65 05/20/2013    Pt c/o: - + hair loss - no heat intolerance/cold intolerance - no tremors - no palpitations - no hyperdefecation/constipation - no weight loss/gain - no problems with concentration - no fatigue - no anxiety/no depression  I reviewed her chart and she also has a history of osteoporosis - on Alendronate, HL - on Zocor.  ROS: Constitutional: no weight gain/loss, no  fatigue, no subjective hyperthermia/hypothermia Eyes: no blurry vision, no xerophthalmia ENT: no sore throat, no nodules palpated in throat, no dysphagia/odynophagia, no hoarseness Cardiovascular: no CP/SOB/palpitations/leg swelling Respiratory: no cough/SOB Gastrointestinal: no N/V/D/C Musculoskeletal: no muscle/joint aches Skin: no rashes, + hair loss Neurological: no tremors/numbness/tingling/dizziness  I reviewed pt's medications, allergies, PMH, social hx, family hx, and changes were documented in the history of present illness. Otherwise, unchanged from my initial visit note:  Past Medical History  Diagnosis Date  . GERD (gastroesophageal reflux disease)   . Hyperlipidemia   . Thyroid disease    No past surgical history on file. History   Social History  . Marital Status: Married    Spouse Name: N/A    Number of Children: 1   Occupational History  . retail.   Social History Main Topics  . Smoking status: Former Smoker    Types: Cigarettes    Quit date: 01/18/1989  . Smokeless tobacco: Never Used  . Alcohol Use: 4.2 oz/week    7 Glasses of wine per week  . Drug Use: No  . Sexual Activity: Yes    Partners: Male   Social History Narrative   Lives in [place] with husband. 102YO son, lives in [place], [place]. Dog in home. Born in [place], raised in [place]. [place]. Previously lived in [place].      Diet - regular diet      Exercise - walking   Caffeine: coffee in the morning; cup of hot tea  Hobbies - yardwork      Work - [employer] in [place]   Current Outpatient Prescriptions on File Prior to Visit  Medication Sig Dispense Refill  . alendronate (FOSAMAX) 70 MG tablet TAKE 1 TABLET BY MOUTH EVERY 7 DAYS. TAKE WITH A FULL GLASS OF WATER ON AN EMPTY STOMACH. 4 tablet 10  . b complex vitamins capsule Take by mouth.    Marland Kitchen BIOTIN PO Take by mouth.    . levothyroxine (SYNTHROID, LEVOTHROID) 25 MCG tablet TAKE 1 TABLET (25 MCG TOTAL) BY MOUTH DAILY BEFORE  BREAKFAST. 30 tablet 0  . simvastatin (ZOCOR) 40 MG tablet TAKE 1 TABLET BY MOUTH EVERY EVENING. 90 tablet 2   No current facility-administered medications on file prior to visit.   No Known Allergies Family History  Problem Relation Age of Onset  . Cancer Mother 30    Breast cancer  . Heart disease Father   . Cancer Father     Prostate  . Diabetes Father     diagnosed later in life  . Asthma Brother    PE: BP 118/78 mmHg  Pulse 71  Temp(Src) 97.9 F (36.6 C) (Oral)  Ht 5\' 6"  (1.676 m)  Wt 142 lb (64.411 kg)  BMI 22.93 kg/m2  SpO2 98% Wt Readings from Last 3 Encounters:  02/17/15 142 lb (64.411 kg)  12/21/14 138 lb (62.596 kg)  06/02/14 140 lb 12 oz (63.844 kg)   Constitutional: overweight, in NAD Eyes: PERRLA, EOMI, no exophthalmos ENT: moist mucous membranes, tonsillomegaly, no thyromegaly, palpable R thyroid nodule on R no cervical lymphadenopathy Cardiovascular: RRR, No MRG Respiratory: CTA B Gastrointestinal: abdomen soft, NT, ND, BS+ Musculoskeletal: no deformities, strength intact in all 4;  Skin: moist, warm, no rashes Neurological: no tremor with outstretched hands, DTR normal in all 4  ASSESSMENT: 1. MNG - Thyroid U/S (05/22/2013): heterogeneous gland, multinodular goiter - largest nodule in left lobe: 2.5 x 1.4 x 2.4 cm, solid, slightly echogenic, moderate internal vascularity, no calcifications.  - largest nodule on the right: 1.4 x 0.9 x 1.0 cm, predominantly  isoechoic, with mild internal vascularity, no calcifications.   - FNA both nodules (06/12/2013): Adequacy Reason Satisfactory For Evaluation. Diagnosis THYROID, FINE NEEDLE ASPIRATION LEFT, (SPECIMEN 1 OF 2, COLLECTED ON 06/11/2013). 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 Claudette Laws MD Pathologist, Electronic Signature (Case signed 06/12/2013) Specimen Clinical Information Nontoxic uninodular goiter, nodule, 2.5 x 1.4 x 2.4cm dominant left nodule   Adequacy  Reason Satisfactory For Evaluation. Diagnosis THYROID, FINE NEEDLE ASPIRATION RIGHT, (SPECIMEN 2 OF 2, COLLECTED ON 11/20 2014). BENIGN. FINDINGS CONSISTENT WITH GOITER. FINDINGS CONSISTENT WITH THE CONTENTS OF A CYST. Claudette Laws MD Pathologist, Electronic Signature (Case signed 06/12/2013) Specimen Clinical Information Nontoxic uninodular goiter, Nodule, 1.4 x 0.9 x 1.0cm domnant right lobe thyroid nodule  The first nodule was characterized as consistent with a follicular neoplasm and/or lesion. This is not an entity described in the Bethesda criteria. I was not sure how to interpret this. The treatment plan for a follicular lesion would be to repeat FNA, while for follicular neoplasm would be surgical lobectomy.  I discussed with the pathologist >> he suggested that we interpret this as FLUS.  2. Hypothyroidism - mild  - On LT4 25 g daily, taken correctly  - Managed by PCP    PLAN: 1. MNG and FLUS - I reviewed the report of her thyroid ultrasound along with the patient. IThe dominant nodules are large, this being a risk factor  for cancer.The 1.4 cm nodule is a cyst, so the risk of cancer is almost 0 considering also the results of the FNA obtained in 05/2013. The 2.5 cm nodule is solid, without calcifications, with mild-moderate internal blood flow, more wide than tall. This nodule was biopsied and this returned as intermediate probability for cancer (follicular lesion of undetermined significance, FLUS). The guidelines recommend to repeat the biopsy in 6 months to a year from previous, however, patient did not return in more than a year and a half. I discussed with her now and she agrees to have another thyroid ultrasound and then most likely another biopsy of the 2.5 cm nodule. Due to the previous indeterminate results, we may need to do either a core biopsy or an FNA with Afirma molecular testing. - if this is not cancer, we can continue to follow her on a yearly basis, and check  another ultrasound in another year or 2. - I'll see her back in a year, assuming her FNA is normal. If FNA abnormal, we will meet sooner.   Orders Placed This Encounter  Procedures  . US Soft Tissue Head/Neck   2. Hypothyroidism - per PCP  Thyroid U/S (02/21/2015): CLINICAL DATA: 64 year old female with a history of thyroid nodules.  Patient has prior right and left lower thyroid nodules biopsy 06/11/2013.  EXAM: THYROID ULTRASOUND  TECHNIQUE: Ultrasound examination of the thyroid gland and adjacent soft tissues was performed.  COMPARISON: None.  FINDINGS: Right thyroid lobe  Measurements: 5.0 cm x 1.5 cm x 1.8 cm. Heterogeneous thyroid with relatively increased flow. Superior nodule measures 7 mm x 6 mm x 8 mm.  Previously biopsied lower right thyroid nodule measures 1.2 cm x 1.0 cm x 1.3 cm. Nodule is solid with internal reflectors.  Left thyroid lobe  Measurements: 6.3 cm x 2.0 cm x 1.9 cm.   Heterogeneous left thyroid with relatively increased flow. Nodule at the inferior aspect has been previously biopsied and measures 2.9 cm x 1.8 cm x 2.8 cm.  Isthmus  Thickness: 3 mm. No nodules visualized.  Lymphadenopathy  None visualized.  IMPRESSION: Multinodular thyroid. Dominant nodules on the right and left have each been previously biopsied 06/11/2013. Recommend correlation with prior biopsy result.  Follow-up by clinical exam is recommended. If patient has known risk factors for thyroid carcinoma, consider follow-up ultrasound in 12 months. If patient is clinically hyperthyroid, consider nuclear medicine thyroid uptake and scan.Reference: Management of Thyroid Nodules Detected at [place]: Society of Radiologists in [place]. Radiology 2005; [id number].  Signed,  Dulcy Fanny. Earleen Newport, DO  [facility]  [facility]   Electronically Signed By: Corrie Mckusick D.O.  Left  thyroid nodule has increased in size. Due to previous history of follicular lesion of undetermined significance, I will suggest repeat biopsy with [facility] molecular testing.     03/03/2015: FNA: Adequacy Reason Satisfactory For Evaluation. 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 SEE COMMENT. COMMENT: THE SPECIMEN IS HYPERCELLULAR AND CONSISTS OF SMALL TO LARGE GROUPS OF FOLLICULAR EPITHELIAL CELLS WITH FOCAL HURTHLE CELL CHANGES. THERE IS MILD CYTOLOGIC ATYPIA, INCLUDING INTRANUCLEAR GROOVES. BASED ON THESE FEATURES, A FOLLICULAR LESION/NEOPLASM CAN NOT BE ENTIRELY RULED OUT. Enid Cutter MD Pathologist, Electronic Signature (Case signed 03/03/2015) Specimen Clinical Information Nodule at the inferior aspect has been previously biopsied and measures 2.9 cm x 1.8 cm x 2.8 cm Source  Thyroid, Fine Needle Aspiration, Left Lobe, (Specimen 1 of 1, collected on 03/02/15 ) Gross Specimen: Received is/are 30cc's of red cytolyt solution and 6 slides in 95% ethyl alcohol. (PH:ph) Prepared: # Smears: 6 # Concentration Technique Slides (i.e. ThinPrep): 1 # Cell Block: 1 Conventional Additional Studies: Holding for Afirma.  03/15/2015 Afirma test results are benign. In this case, I would suggest referral to surgery for hemithyroidectomy, rather than total thyroidectomy, and to completion thyroidectomy if needed.  D/w pt >> she will let me know when she is ready for referral to Sx.

## 2015-02-21 ENCOUNTER — Ambulatory Visit
Admission: RE | Admit: 2015-02-21 | Discharge: 2015-02-21 | Disposition: A | Discharge status: Home / Self Care | Payer: BLUE CROSS/BLUE SHIELD | Source: Ambulatory Visit | Attending: Internal Medicine | Admitting: Internal Medicine

## 2015-02-21 DIAGNOSIS — E041 Nontoxic single thyroid nodule: Secondary | ICD-10-CM

## 2015-02-21 NOTE — Addendum Note (Signed)
Addended by: Philemon Kingdom on: 02/21/2015 12:32 PM   Modules accepted: Orders, Level of Service

## 2015-02-22 ENCOUNTER — Ambulatory Visit (INDEPENDENT_AMBULATORY_CARE_PROVIDER_SITE_OTHER): Payer: BLUE CROSS/BLUE SHIELD | Admitting: Internal Medicine

## 2015-02-22 ENCOUNTER — Encounter: Payer: Self-pay | Admitting: Internal Medicine

## 2015-02-22 VITALS — BP 101/67 | HR 75 | Temp 98.2°F | Ht 65.5 in | Wt 141.5 lb

## 2015-02-22 DIAGNOSIS — Z0000 Encounter for general adult medical examination without abnormal findings: Secondary | ICD-10-CM | POA: Diagnosis not present

## 2015-02-22 LAB — LIPID PANEL
CHOL/HDL RATIO: 2
Cholesterol: 167 mg/dL (ref 0–200)
HDL: 71.6 mg/dL (ref >39.00)
LDL Cholesterol: 71 mg/dL (ref 0–99)
NonHDL: 95.04
Triglycerides: 118 mg/dL (ref 0.0–149.0)
VLDL: 23.6 mg/dL (ref 0.0–40.0)

## 2015-02-22 LAB — CBC WITH DIFFERENTIAL/PLATELET
BASOS ABS: 0 10*3/uL (ref 0.0–0.1)
BASOS PCT: 0.5 % (ref 0.0–3.0)
EOS ABS: 0.2 10*3/uL (ref 0.0–0.7)
Eosinophils Relative: 4.6 % (ref 0.0–5.0)
HCT: 38.8 % (ref 36.0–46.0)
Hemoglobin: 13.2 g/dL (ref 12.0–15.0)
Lymphocytes Relative: 26.9 % (ref 12.0–46.0)
Lymphs Abs: 1.2 10*3/uL (ref 0.7–4.0)
MCHC: 34 g/dL (ref 30.0–36.0)
MCV: 98.1 fl (ref 78.0–100.0)
MONO ABS: 0.4 10*3/uL (ref 0.1–1.0)
MONOS PCT: 8 % (ref 3.0–12.0)
NEUTROS PCT: 60 % (ref 43.0–77.0)
Neutro Abs: 2.7 10*3/uL (ref 1.4–7.7)
Platelets: 254 10*3/uL (ref 150.0–400.0)
RBC: 3.96 Mil/uL (ref 3.87–5.11)
RDW: 13.4 % (ref 11.5–15.5)
WBC: 4.4 10*3/uL (ref 4.0–10.5)

## 2015-02-22 LAB — TSH: TSH: 5.45 u[IU]/mL — ABNORMAL HIGH (ref 0.35–4.50)

## 2015-02-22 NOTE — Patient Instructions (Signed)

## 2015-02-22 NOTE — Assessment & Plan Note (Signed)
General medical exam including breast exam normal today. PAP and pelvic deferred per her preference, plan repeat in 2017. Mammogram ordered. Colonoscopy UTD. Immunizations UTD. Labs as ordered. Encouraged healthy diet and exercise.

## 2015-02-22 NOTE — Progress Notes (Signed)
Pre visit review using our clinic review tool, if applicable. No additional management support is needed unless otherwise documented below in the visit note. 

## 2015-02-22 NOTE — Progress Notes (Signed)
   Subjective:    Patient ID: Shannon [name], female    DOB: 1950-07-26, 64 y.o.   MRN: [id number]  HPI  64YO female presents for annual exam.  Scheduled for thyroid biopsy 8/23 at [facility]. Feeling well. No concerns today.  Past medical, surgical, family and social history per today's encounter.  Review of Systems  Constitutional: Negative for fever, chills, appetite change, fatigue and unexpected weight change.  Eyes: Negative for visual disturbance.  Respiratory: Negative for shortness of breath.   Cardiovascular: Negative for chest pain and leg swelling.  Gastrointestinal: Negative for nausea, vomiting, abdominal pain, diarrhea and constipation.  Musculoskeletal: Negative for myalgias and arthralgias.  Skin: Negative for color change and rash.  Hematological: Negative for adenopathy. Does not bruise/bleed easily.  Psychiatric/Behavioral: Negative for sleep disturbance and dysphoric mood. The patient is not nervous/anxious.        Objective:    BP 101/67 mmHg  Pulse 75  Temp(Src) 98.2 F (36.8 C) (Oral)  Ht 5' 5.5" (1.664 m)  Wt 141 lb 8 oz (64.184 kg)  BMI 23.18 kg/m2  SpO2 96% Physical Exam  Constitutional: She is oriented to person, place, and time. She appears well-developed and well-nourished. No distress.  HENT:  Head: Normocephalic and atraumatic.  Right Ear: External ear normal.  Left Ear: External ear normal.  Nose: Nose normal.  Mouth/Throat: Oropharynx is clear and moist. No oropharyngeal exudate.  Eyes: Conjunctivae are normal. Pupils are equal, round, and reactive to light. Right eye exhibits no discharge. Left eye exhibits no discharge. No scleral icterus.  Neck: Normal range of motion. Neck supple. No tracheal deviation present. No thyromegaly present.  Cardiovascular: Normal rate, regular rhythm, normal heart sounds and intact distal pulses.  Exam reveals no gallop and no friction rub.   No murmur heard. Pulmonary/Chest: Effort normal and  breath sounds normal. No accessory muscle usage. No tachypnea. No respiratory distress. She has no decreased breath sounds. She has no wheezes. She has no rales. She exhibits no tenderness. Right breast exhibits no inverted nipple, no mass, no nipple discharge, no skin change and no tenderness. Left breast exhibits no inverted nipple, no mass, no nipple discharge, no skin change and no tenderness. Breasts are symmetrical.  Abdominal: Soft. Bowel sounds are normal. She exhibits no distension and no mass. There is no tenderness. There is no rebound and no guarding.  Musculoskeletal: Normal range of motion. She exhibits no edema or tenderness.  Lymphadenopathy:    She has no cervical adenopathy.  Neurological: She is alert and oriented to person, place, and time. No cranial nerve deficit. She exhibits normal muscle tone. Coordination normal.  Skin: Skin is warm and dry. No rash noted. She is not diaphoretic. No erythema. No pallor.  Psychiatric: She has a normal mood and affect. Her behavior is normal. Judgment and thought content normal.          Assessment & Plan:   Problem List Items Addressed This Visit      Unprioritized   Routine general medical examination at a health care facility - Primary    General medical exam including breast exam normal today. PAP and pelvic deferred per her preference, plan repeat in 2017. Mammogram ordered. Colonoscopy UTD. Immunizations UTD. Labs as ordered. Encouraged healthy diet and exercise.      Relevant Orders   MM Digital Screening       Return in about 6 months (around 08/25/2015) for Recheck.

## 2015-02-23 ENCOUNTER — Encounter: Payer: Self-pay | Admitting: Internal Medicine

## 2015-02-23 LAB — COMPLETE METABOLIC PANEL WITH GFR
ALBUMIN: 4.3 g/dL (ref 3.6–5.1)
ALT: 39 U/L — ABNORMAL HIGH (ref 6–29)
AST: 47 U/L — ABNORMAL HIGH (ref 10–35)
Alkaline Phosphatase: 35 U/L (ref 33–130)
BUN: 20 mg/dL (ref 7–25)
CO2: 24 mmol/L (ref 20–31)
CREATININE: 0.73 mg/dL (ref 0.50–0.99)
Calcium: 9.3 mg/dL (ref 8.6–10.4)
Chloride: 102 mmol/L (ref 98–110)
GFR, Est Non African American: 87 mL/min (ref >=60)
Glucose, Bld: 79 mg/dL (ref 65–99)
Potassium: 4.6 mmol/L (ref 3.5–5.3)
SODIUM: 135 mmol/L (ref 135–146)
TOTAL PROTEIN: 7.2 g/dL (ref 6.1–8.1)
Total Bilirubin: 0.9 mg/dL (ref 0.2–1.2)

## 2015-03-02 ENCOUNTER — Ambulatory Visit
Admission: RE | Admit: 2015-03-02 | Discharge: 2015-03-02 | Disposition: A | Discharge status: Home / Self Care | Payer: BLUE CROSS/BLUE SHIELD | Source: Ambulatory Visit | Attending: Internal Medicine | Admitting: Internal Medicine

## 2015-03-02 ENCOUNTER — Other Ambulatory Visit (HOSPITAL_COMMUNITY)
Admission: RE | Admit: 2015-03-02 | Discharge: 2015-03-02 | Disposition: A | Discharge status: Home / Self Care | Payer: BLUE CROSS/BLUE SHIELD | Source: Ambulatory Visit | Attending: Interventional Radiology | Admitting: Interventional Radiology

## 2015-03-02 DIAGNOSIS — E041 Nontoxic single thyroid nodule: Secondary | ICD-10-CM | POA: Insufficient documentation

## 2015-03-08 ENCOUNTER — Other Ambulatory Visit: Payer: Self-pay | Admitting: Internal Medicine

## 2015-03-08 ENCOUNTER — Telehealth: Payer: Self-pay | Admitting: *Deleted

## 2015-03-08 MED ORDER — LEVOTHYROXINE SODIUM 50 MCG PO TABS: 50.0000 ug | ORAL_TABLET | Freq: Every day | ORAL | Status: DC

## 2015-03-08 NOTE — Telephone Encounter (Signed)
Called pt and advised her per Dr Gherghe's message below. Pt voiced understanding.  

## 2015-03-08 NOTE — Telephone Encounter (Signed)
TSH a little high >> increase the LT4 dose to 50 mcg daily >> let's repeat labs in 5-6 weeks: TSH only. Please let her know the Afirma test is not back yet. Please send [place] a message in a week if the results are not send to her by then.

## 2015-03-08 NOTE — Telephone Encounter (Signed)
Pt requested a refill of her levothyroxine. Pt had TSH level checked on 02/22/15. Please review and advise. Thank you.

## 2015-03-15 ENCOUNTER — Other Ambulatory Visit: Payer: BLUE CROSS/BLUE SHIELD

## 2015-03-15 ENCOUNTER — Encounter: Payer: Self-pay | Admitting: Internal Medicine

## 2015-03-17 ENCOUNTER — Encounter (HOSPITAL_COMMUNITY): Payer: Self-pay

## 2015-03-31 ENCOUNTER — Ambulatory Visit
Admission: RE | Admit: 2015-03-31 | Discharge: 2015-03-31 | Disposition: A | Discharge status: Home / Self Care | Payer: BLUE CROSS/BLUE SHIELD | Source: Ambulatory Visit | Attending: Internal Medicine | Admitting: Internal Medicine

## 2015-03-31 ENCOUNTER — Encounter: Payer: Self-pay | Admitting: Internal Medicine

## 2015-03-31 ENCOUNTER — Other Ambulatory Visit: Payer: Self-pay | Admitting: Internal Medicine

## 2015-03-31 DIAGNOSIS — Z1231 Encounter for screening mammogram for malignant neoplasm of breast: Secondary | ICD-10-CM | POA: Diagnosis not present

## 2015-03-31 DIAGNOSIS — Z0000 Encounter for general adult medical examination without abnormal findings: Secondary | ICD-10-CM

## 2015-05-02 ENCOUNTER — Encounter: Payer: Self-pay | Admitting: Internal Medicine

## 2015-05-03 NOTE — Telephone Encounter (Signed)
Rose, did you get this message?

## 2015-05-05 NOTE — Progress Notes (Unsigned)
Yes, I did get the messages [facility].  I'm sorry I did not reply, but I've been out w/family emergency.  Spoke w/patient this morning and this is billed from hospital side, which I cannot see so I referred her to [facility] at [phone].  Thank you and my apologies again.

## 2015-06-07 ENCOUNTER — Encounter: Payer: Self-pay | Admitting: Internal Medicine

## 2015-06-23 ENCOUNTER — Other Ambulatory Visit: Payer: Self-pay | Admitting: Internal Medicine

## 2015-07-25 ENCOUNTER — Encounter: Payer: Self-pay | Admitting: Internal Medicine

## 2015-07-26 ENCOUNTER — Other Ambulatory Visit: Payer: Self-pay | Admitting: *Deleted

## 2015-07-26 ENCOUNTER — Encounter: Payer: Self-pay | Admitting: Internal Medicine

## 2015-07-26 DIAGNOSIS — E038 Other specified hypothyroidism: Secondary | ICD-10-CM

## 2015-07-27 ENCOUNTER — Encounter: Payer: Self-pay | Admitting: Internal Medicine

## 2015-07-28 ENCOUNTER — Other Ambulatory Visit (INDEPENDENT_AMBULATORY_CARE_PROVIDER_SITE_OTHER): Payer: BLUE CROSS/BLUE SHIELD

## 2015-07-28 ENCOUNTER — Encounter: Payer: Self-pay | Admitting: Internal Medicine

## 2015-07-28 ENCOUNTER — Other Ambulatory Visit: Payer: Self-pay | Admitting: *Deleted

## 2015-07-28 ENCOUNTER — Other Ambulatory Visit: Payer: Self-pay | Admitting: Internal Medicine

## 2015-07-28 DIAGNOSIS — E039 Hypothyroidism, unspecified: Secondary | ICD-10-CM

## 2015-07-28 DIAGNOSIS — E038 Other specified hypothyroidism: Secondary | ICD-10-CM

## 2015-07-28 LAB — TSH: TSH: 5.97 u[IU]/mL — ABNORMAL HIGH (ref 0.35–4.50)

## 2015-07-28 LAB — T4, FREE: Free T4: 0.76 ng/dL (ref 0.60–1.60)

## 2015-07-28 MED ORDER — SIMVASTATIN 40 MG PO TABS: 40.0000 mg | ORAL_TABLET | Freq: Every evening | ORAL | Status: DC

## 2015-07-28 MED ORDER — LEVOTHYROXINE SODIUM 50 MCG PO TABS: 50.0000 ug | ORAL_TABLET | Freq: Every day | ORAL | Status: DC

## 2015-07-28 MED ORDER — LEVOTHYROXINE SODIUM 75 MCG PO TABS: 75.0000 ug | ORAL_TABLET | Freq: Every day | ORAL | Status: DC

## 2015-07-29 ENCOUNTER — Telehealth: Payer: Self-pay | Admitting: *Deleted

## 2015-07-29 NOTE — Telephone Encounter (Signed)
Lab called back there could not do a add on for cmp, passed the time frame

## 2015-07-29 NOTE — Telephone Encounter (Signed)
Called pt to see when she can come in, she says she is going out of town and is not sure when she will be back but when she does come back she will call and make a lab appointment

## 2015-07-29 NOTE — Telephone Encounter (Signed)
OK. Can you see if she would like to return to have this drawn?

## 2015-09-07 ENCOUNTER — Other Ambulatory Visit: Payer: Self-pay | Admitting: Internal Medicine

## 2015-09-07 ENCOUNTER — Encounter: Payer: Self-pay | Admitting: Internal Medicine

## 2015-09-07 MED ORDER — ALENDRONATE SODIUM 70 MG PO TABS: ORAL_TABLET | ORAL | Status: DC

## 2015-09-07 NOTE — Telephone Encounter (Signed)
Last refilled in December the prescription states end date was in 2016. Please advise?

## 2016-01-03 ENCOUNTER — Other Ambulatory Visit: Payer: Self-pay | Admitting: Internal Medicine

## 2016-01-17 ENCOUNTER — Telehealth: Payer: Self-pay | Admitting: Internal Medicine

## 2016-01-23 ENCOUNTER — Encounter: Payer: Self-pay | Admitting: Internal Medicine

## 2016-01-23 ENCOUNTER — Ambulatory Visit (INDEPENDENT_AMBULATORY_CARE_PROVIDER_SITE_OTHER): Payer: Managed Care, Other (non HMO) | Admitting: Internal Medicine

## 2016-01-23 VITALS — BP 120/80 | HR 72 | Ht 66.0 in | Wt 139.4 lb

## 2016-01-23 DIAGNOSIS — M810 Age-related osteoporosis without current pathological fracture: Secondary | ICD-10-CM

## 2016-01-23 DIAGNOSIS — E785 Hyperlipidemia, unspecified: Secondary | ICD-10-CM

## 2016-01-23 DIAGNOSIS — E041 Nontoxic single thyroid nodule: Secondary | ICD-10-CM

## 2016-01-23 DIAGNOSIS — Z1159 Encounter for screening for other viral diseases: Secondary | ICD-10-CM | POA: Diagnosis not present

## 2016-01-23 LAB — COMPREHENSIVE METABOLIC PANEL
ALK PHOS: 38 U/L — AB (ref 39–117)
ALT: 29 U/L (ref 0–35)
AST: 38 U/L — ABNORMAL HIGH (ref 0–37)
Albumin: 4.5 g/dL (ref 3.5–5.2)
BUN: 19 mg/dL (ref 6–23)
CHLORIDE: 103 meq/L (ref 96–112)
CO2: 28 mEq/L (ref 19–32)
Calcium: 9.8 mg/dL (ref 8.4–10.5)
Creatinine, Ser: 0.82 mg/dL (ref 0.40–1.20)
GFR: 74.37 mL/min (ref >60.00)
GLUCOSE: 87 mg/dL (ref 70–99)
POTASSIUM: 4.6 meq/L (ref 3.5–5.1)
SODIUM: 136 meq/L (ref 135–145)
TOTAL PROTEIN: 7.7 g/dL (ref 6.0–8.3)
Total Bilirubin: 0.7 mg/dL (ref 0.2–1.2)

## 2016-01-23 LAB — TSH: TSH: 6.15 u[IU]/mL — AB (ref 0.35–4.50)

## 2016-01-23 LAB — T4, FREE: Free T4: 0.97 ng/dL (ref 0.60–1.60)

## 2016-01-23 MED ORDER — ALENDRONATE SODIUM 70 MG PO TABS: ORAL_TABLET | ORAL | Status: DC

## 2016-01-23 MED ORDER — SIMVASTATIN 20 MG PO TABS: 20.0000 mg | ORAL_TABLET | Freq: Every evening | ORAL | Status: DC

## 2016-01-23 NOTE — Progress Notes (Signed)
Subjective:    Patient ID: Shannon [name], female    DOB: [birth year], 65 y.o.   MRN: [id number]  HPI  65YO female presents for follow up.  Osteoporosis - Complaint with fosamax. Tolerating well.  Hyperlipidemia - Concerned about risks of Simvastatin. Would like to try a lower dose. No current side effects noted.  Wt Readings from Last 3 Encounters:  01/23/16 139 lb 6.4 oz (63.231 kg)  02/22/15 141 lb 8 oz (64.184 kg)  02/17/15 142 lb (64.411 kg)   BP Readings from Last 3 Encounters:  01/23/16 120/80  02/22/15 101/67  02/17/15 118/78    Past Medical History  Diagnosis Date  . GERD (gastroesophageal reflux disease)   . Hyperlipidemia   . Thyroid disease    Family History  Problem Relation Age of Onset  . Cancer Mother 93    Breast cancer  . Breast cancer Mother 77  . Heart disease Father   . Cancer Father     Prostate  . Diabetes Father     diagnosed later in life  . Prostate cancer Father   . Asthma Brother    No past surgical history on file. Social History   Social History  . Marital Status: Married    Spouse Name: N/A  . Number of Children: N/A  . Years of Education: N/A   Social History Main Topics  . Smoking status: Former Smoker    Types: Cigarettes    Quit date: 01/18/1989  . Smokeless tobacco: Never Used  . Alcohol Use: 4.2 oz/week    7 Glasses of wine per week  . Drug Use: No  . Sexual Activity:    Partners: Male   Other Topics Concern  . None   Social History Narrative   Lives in [place] with husband. 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 Born in [place], raised in [place]. [place]. Previously lived in [place].      Diet - regular diet      Exercise - walking   Caffeine: coffee in the morning; cup of hot tea      Hobbies - yardwork      Work - [employer] in [place]    Review of Systems  Constitutional: Negative for fever, chills, appetite change, fatigue and unexpected weight change.  Eyes: Negative for visual  disturbance.  Respiratory: Negative for cough, chest tightness and shortness of breath.   Cardiovascular: Negative for chest pain and leg swelling.  Gastrointestinal: Negative for nausea, vomiting, abdominal pain, diarrhea and constipation.  Musculoskeletal: Negative for myalgias and arthralgias.  Skin: Negative for color change and rash.  Hematological: Negative for adenopathy. Does not bruise/bleed easily.  Psychiatric/Behavioral: Negative for suicidal ideas, sleep disturbance and dysphoric mood. The patient is not nervous/anxious.        Objective:    BP 120/80 mmHg  Pulse 72  Ht 5\' 6"  (1.676 m)  Wt 139 lb 6.4 oz (63.231 kg)  BMI 22.51 kg/m2  SpO2 99% Physical Exam  Constitutional: She is oriented to person, place, and time. She appears well-developed and well-nourished. No distress.  HENT:  Head: Normocephalic and atraumatic.  Right Ear: External ear normal.  Left Ear: External ear normal.  Nose: Nose normal.  Mouth/Throat: Oropharynx is clear and moist. No oropharyngeal exudate.  Eyes: Conjunctivae are normal. Pupils are equal, round, and reactive to light. Right eye exhibits no discharge. Left eye exhibits no discharge. No scleral icterus.  Neck: Normal range of motion. Neck supple.  No tracheal deviation present. No thyromegaly present.  Cardiovascular: Normal rate, regular rhythm, normal heart sounds and intact distal pulses.  Exam reveals no gallop and no friction rub.   No murmur heard. Pulmonary/Chest: Effort normal and breath sounds normal. No respiratory distress. She has no wheezes. She has no rales. She exhibits no tenderness.  Musculoskeletal: Normal range of motion. She exhibits no edema or tenderness.  Lymphadenopathy:    She has no cervical adenopathy.  Neurological: She is alert and oriented to person, place, and time. No cranial nerve deficit. She exhibits normal muscle tone. Coordination normal.  Skin: Skin is warm and dry. No rash noted. She is not  diaphoretic. No erythema. No pallor.  Psychiatric: She has a normal mood and affect. Her behavior is normal. Judgment and thought content normal.          Assessment & Plan:  Over 4min of which >50% spent in face-to-face contact with patient discussing plan of care  Problem List Items Addressed This Visit      Unprioritized   Hyperlipidemia    Tolerating Simvastatin well with excellent control of lipids. However, she is concerned about side effects. Will try decrease dose of Simvastatin to 20mg  daily and recheck lipids in 3 months.      Relevant Medications   simvastatin (ZOCOR) 20 MG tablet   Other Relevant Orders   Comprehensive metabolic panel   Hepatitis C antibody   Osteoporosis - Primary    Will recheck bone density test. Continue Fosamax. Discussed taking a break from medication in the future.      Relevant Medications   alendronate (FOSAMAX) 70 MG tablet   Other Relevant Orders   DG Bone Density   Thyroid nodule    Repeat thyroid function with labs today.      Relevant Orders   TSH   T4, free    Other Visit Diagnoses    Need for hepatitis C screening test        Relevant Orders    Hepatitis C antibody        Return in about 3 months (around 04/24/2016) for New Patient.  Ronette Deter, MD [facility]

## 2016-01-23 NOTE — Progress Notes (Signed)
Pre visit review using our clinic review tool, if applicable. No additional management support is needed unless otherwise documented below in the visit note. 

## 2016-01-23 NOTE — Assessment & Plan Note (Signed)
Will recheck bone density test. Continue Fosamax. Discussed taking a break from medication in the future.

## 2016-01-23 NOTE — Assessment & Plan Note (Signed)
Repeat thyroid function with labs today.

## 2016-01-23 NOTE — Assessment & Plan Note (Signed)
Tolerating Simvastatin well with excellent control of lipids. However, she is concerned about side effects. Will try decrease dose of Simvastatin to 20mg  daily and recheck lipids in 3 months.

## 2016-01-23 NOTE — Patient Instructions (Signed)
Decrease Simvastatin to 20mg  daily.  Repeat cholesterol in 3 months.

## 2016-01-24 LAB — HEPATITIS C ANTIBODY: HCV Ab: NEGATIVE

## 2016-01-30 ENCOUNTER — Other Ambulatory Visit: Payer: Self-pay

## 2016-01-30 ENCOUNTER — Encounter: Payer: Self-pay | Admitting: Internal Medicine

## 2016-01-30 MED ORDER — ALENDRONATE SODIUM 70 MG PO TABS: ORAL_TABLET | ORAL | Status: DC

## 2016-01-30 NOTE — Telephone Encounter (Signed)
Medication refill

## 2016-02-17 ENCOUNTER — Encounter: Payer: Self-pay | Admitting: Internal Medicine

## 2016-02-17 ENCOUNTER — Ambulatory Visit (INDEPENDENT_AMBULATORY_CARE_PROVIDER_SITE_OTHER): Payer: Managed Care, Other (non HMO) | Admitting: Internal Medicine

## 2016-02-17 VITALS — BP 120/70 | HR 71 | Temp 97.8°F | Wt 141.0 lb

## 2016-02-17 DIAGNOSIS — E039 Hypothyroidism, unspecified: Secondary | ICD-10-CM | POA: Diagnosis not present

## 2016-02-17 DIAGNOSIS — E041 Nontoxic single thyroid nodule: Secondary | ICD-10-CM | POA: Diagnosis not present

## 2016-02-17 NOTE — Progress Notes (Signed)
Pre visit review using our clinic review tool, if applicable. No additional management support is needed unless otherwise documented below in the visit note. 

## 2016-02-17 NOTE — Progress Notes (Signed)
Patient ID: JAZZMYN BYES, female   DOB: Sep 07, 1950, 65 y.o.   MRN: [id number]  HPI f/u for  TULSI GRAETER is a 65 y.o.-year-old female, returning for f/u for MNG and hypothyroidism. Last visit 1 year ago.  Reviewed hx: Pt's PCP felt a right sided nodule at [facility] in 2014 >> Thyroid U/S (05/22/2013): heterogeneous gland, multinodular goiter with  - largest nodule in left lobe, 2.5 x 1.4 x 2.4 cm, solid, slightly echogenic, moderate internal vascularity, no calcifications.  - Largest nodule on the right: 1.4 x 0.9 x 1.0 cm, predominantly  isoechoic, with mild internal vascularity. No calcifications.   We biopsied both nodules (05/2013): The 2.5 cm nodule returned as FLUS, while the 1.4 cm nodule returned benign.  A repeat biopsy of the left thyroid nodule returned again as FLUS. We perform molecular analysis with the [facility] test and this was negative for malignancy.   Pt denies feeling nodules in neck, hoarseness, dysphagia/odynophagia, SOB with lying down.  She also has a h/o mild hypothyroidism dx ~2003 >> started on levoxyl then >> but stopped subsequently as she did not feel different. She restarted LT4, on 25 mcg daily >> now on 75  Mcg since 07/2015. This is managed by her PCP.   She takes LT4: - in am - fasting - few minutes later: coffee + milk - eats >30 min later - + MVI 2h later - + Biotin - took this yesterday - skips the dose 1/week  I reviewed pt's most recent  thyroid tests: Lab Results  Component Value Date   TSH 6.15 (H) 01/23/2016   TSH 5.97 (H) 07/28/2015   TSH 5.45 (H) 02/22/2015   TSH 7.34 (H) 06/02/2014   TSH 3.30 02/24/2014   TSH 8.35 (H) 11/24/2013   TSH 7.73 (H) 05/20/2013   FREET4 0.97 01/23/2016   FREET4 0.76 07/28/2015   FREET4 0.78 06/02/2014   FREET4 1.18 02/24/2014   FREET4 0.61 11/24/2013   FREET4 0.65 05/20/2013    Pt c/o: - + hair loss - no heat intolerance/cold intolerance - no tremors - no palpitations - no  hyperdefecation/constipation - no weight loss/gain - no problems with concentration - no fatigue - no anxiety/no depression  She also has a history of osteoporosis - on Alendronate, HL - on Zocor.  ROS: Constitutional: no weight gain/loss, no fatigue, no subjective hyperthermia/hypothermia Eyes: no blurry vision, no xerophthalmia ENT: no sore throat, no nodules palpated in throat, no dysphagia/odynophagia, no hoarseness Cardiovascular: no CP/SOB/palpitations/leg swelling Respiratory: no cough/SOB Gastrointestinal: no N/V/D/C Musculoskeletal: no muscle/joint aches Skin: no rashes, + hair loss Neurological: no tremors/numbness/tingling/dizziness  I reviewed pt's medications, allergies, PMH, social hx, family hx, and changes were documented in the history of present illness. Otherwise, unchanged from my initial visit note:  Past Medical History:  Diagnosis Date  . GERD (gastroesophageal reflux disease)   . Hyperlipidemia   . Thyroid disease    No past surgical history on file. History   Social History  . Marital Status: Married    Spouse Name: N/A    Number of Children: 1   Occupational History  . retail.   Social History Main Topics  . Smoking status: Former Smoker    Types: Cigarettes    Quit date: 01/18/1989  . Smokeless tobacco: Never Used  . Alcohol Use: 4.2 oz/week    7 Glasses of wine per week  . Drug Use: No  . Sexual Activity: Yes    Partners: Male   Social History  Narrative   Lives in [place] with husband. 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 Dog in home. Born in [place], raised in [place]. [place]. Previously lived in [place].      Diet - regular diet      Exercise - walking   Caffeine: coffee in the morning; cup of hot tea      Hobbies - yardwork      Work - [employer] in [place]   Current Outpatient Prescriptions on File Prior to Visit  Medication Sig Dispense Refill  . alendronate (FOSAMAX) 70 MG tablet TAKE 1 TABLET BY MOUTH EVERY 7 DAYS. TAKE  WITH A FULL GLASS OF WATER ON AN EMPTY STOMACH. 4 tablet 1  . b complex vitamins capsule Take by mouth.    Marland Kitchen BIOTIN PO Take by mouth.    . levothyroxine (SYNTHROID, LEVOTHROID) 75 MCG tablet Take 1 tablet (75 mcg total) by mouth daily. 60 tablet 2  . simvastatin (ZOCOR) 20 MG tablet Take 1 tablet (20 mg total) by mouth every evening. 90 tablet 3   No current facility-administered medications on file prior to visit.    No Known Allergies Family History  Problem Relation Age of Onset  . Cancer Mother 57    Breast cancer  . Breast cancer Mother 58  . Heart disease Father   . Cancer Father     Prostate  . Diabetes Father     diagnosed later in life  . Prostate cancer Father   . Asthma Brother    PE: BP 120/70   Pulse 71   Temp 97.8 F (36.6 C) (Oral)   Wt 141 lb (64 kg)   SpO2 98%   BMI 22.76 kg/m  Wt Readings from Last 3 Encounters:  02/17/16 141 lb (64 kg)  01/23/16 139 lb 6.4 oz (63.2 kg)  02/22/15 141 lb 8 oz (64.2 kg)   Constitutional: overweight, in NAD Eyes: PERRLA, EOMI, no exophthalmos ENT: moist mucous membranes, tonsillomegaly, no thyromegaly, Thyroid fullness bilaterally, no cervical lymphadenopathy Cardiovascular: RRR, No MRG Respiratory: CTA B Gastrointestinal: abdomen soft, NT, ND, BS+ Musculoskeletal: no deformities, strength intact in all 4;  Skin: moist, warm, no rashes Neurological: no tremor with outstretched hands, DTR normal in all 4  ASSESSMENT: 1. MNG - Thyroid U/S (05/22/2013): heterogeneous gland, multinodular goiter - largest nodule in left lobe: 2.5 x 1.4 x 2.4 cm, solid, slightly echogenic, moderate internal vascularity, no calcifications.  - largest nodule on the right: 1.4 x 0.9 x 1.0 cm, predominantly  isoechoic, with mild internal vascularity, no calcifications.   - FNA both nodules (06/12/2013): Adequacy Reason Satisfactory For Evaluation. Diagnosis THYROID, FINE NEEDLE ASPIRATION LEFT, (SPECIMEN 1 OF 2, COLLECTED ON  06/11/2013). 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 Claudette Laws MD Pathologist, Electronic Signature (Case signed 06/12/2013) Specimen Clinical Information Nontoxic uninodular goiter, nodule, 2.5 x 1.4 x 2.4cm dominant left nodule   Adequacy Reason Satisfactory For Evaluation. Diagnosis THYROID, FINE NEEDLE ASPIRATION RIGHT, (SPECIMEN 2 OF 2, COLLECTED ON 11/20 2014). BENIGN. FINDINGS CONSISTENT WITH GOITER. FINDINGS CONSISTENT WITH THE CONTENTS OF A CYST. Claudette Laws MD Pathologist, Electronic Signature (Case signed 06/12/2013) Specimen Clinical Information Nontoxic uninodular goiter, Nodule, 1.4 x 0.9 x 1.0cm domnant right lobe thyroid nodule  The first nodule was characterized as consistent with a follicular neoplasm and/or lesion. This is not an entity described in the Bethesda criteria. I was not sure how to interpret this. The treatment plan for a follicular lesion would be to repeat  FNA, while for follicular neoplasm would be surgical lobectomy.  I discussed with the pathologist >> he suggested that we interpret this as FLUS.  Thyroid U/S (02/21/2015):  Right thyroid lobe Measurements: 5.0 cm x 1.5 cm x 1.8 cm. Heterogeneous thyroid with relatively increased flow.  - Superior nodule measures 7 mm x 6 mm x 8 mm. - Previously biopsied lower right thyroid nodule measures 1.2 cm x 1.0 cm x 1.3 cm. Nodule is solid with internal reflectors.  Left thyroid lobe Measurements: 6.3 cm x 2.0 cm x 1.9 cm.  Heterogeneous left thyroid with relatively increased flow. - Nodule at the inferior aspect has been previously biopsied and measures 2.9 cm x 1.8 cm x 2.8 cm.  Isthmus Thickness: 3 mm. No nodules visualized.  Lymphadenopathy: None visualized.  Left thyroid nodule has increased in size. Due to previous history of follicular lesion of undetermined significance, I suggested repeat biopsy with [facility] molecular testing.     03/03/2015: FNA: Adequacy  Reason Satisfactory For Evaluation. 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 COMMENT: THE SPECIMEN IS HYPERCELLULAR AND CONSISTS OF SMALL TO LARGE GROUPS OF FOLLICULAR EPITHELIAL CELLS WITH FOCAL HURTHLE CELL CHANGES. THERE IS MILD CYTOLOGIC ATYPIA, INCLUDING INTRANUCLEAR GROOVES. BASED ON THESE FEATURES, A FOLLICULAR LESION/NEOPLASM CAN NOT BE ENTIRELY RULED OUT. Specimen Clinical Information Nodule at the inferior aspect has been previously biopsied and measures 2.9 cm x 1.8 cm x 2.8 cm Source Thyroid, Fine Needle Aspiration, Left Lobe, (Specimen 1 of 1, collected on 03/02/15 )  Afirma test results are benign.  2. Hypothyroidism - Uncontrolled - On LT4 75 g daily  PLAN: 1. MNG and FLUS - I reviewed the report of her previous ultrasounds:  1. The 1.4 cm nodule is a cyst, so the risk of cancer is almost 0 considering also the results of the FNA obtained in 05/2013.  2. The 2.5 cm nodule is solid, without calcifications, with mild-moderate internal blood flow, more wide than tall. This nodule was biopsied and this returned as intermediate probability for cancer (follicular lesion of undetermined significance, FLUS). We repeated a biopsy with the same result. However, an [facility] test returned benign, which is associated with a risk of cancer less than 1-2%. The new guidelines recommend to not refer to surgery for such a result. I discussed this with the patient and she agrees. - We can continue to follow her on a yearly basis, and check another ultrasound next year - I'll see her back in a year  2. Hypothyroidism - uncontrolled - Her TSH continues to be elevated, last was 6 earlier this month - Upon questioning, she is not taking her levothyroxine correctly: She take it close to coffee with milk, takes multivitamins 2 hours  later rather than 4, and skips levothyroxine once a week when she takes alendronate. - I recommended to take the thyroid hormone every day, with water, at least 30 minutes before breakfast, separated by at least 4 hours from: - acid reflux medications - calcium - iron - multivitamins - We'll have her back in 6 weeks for labs, and I advised her to skip biotin for 5 days then so that it does not interfere with the assay  Orders Placed This Encounter  Procedures  . T4, free  . TSH   Philemon Kingdom, MD PhD [facility] Endocrinology

## 2016-02-17 NOTE — Patient Instructions (Addendum)
Please come back in 6 weeks for labs.  Move coffee + milk later (>30 min) or skip the milk/creamer.  Move the Multivitamins at night.  Do not skip Levothyroxine in the Alendronate days.  Please come back for a follow-up appointment in 6 months.

## 2016-02-22 ENCOUNTER — Other Ambulatory Visit: Payer: Self-pay

## 2016-02-22 ENCOUNTER — Telehealth: Payer: Self-pay | Admitting: Internal Medicine

## 2016-02-22 MED ORDER — LEVOTHYROXINE SODIUM 75 MCG PO TABS: 75.0000 ug | ORAL_TABLET | Freq: Every day | ORAL | 2 refills | Status: DC

## 2016-02-22 NOTE — Telephone Encounter (Signed)
Pt needs rx for 75 mg of levothyroxine called into [facility] in  she is going out of town in about an hour

## 2016-02-22 NOTE — Telephone Encounter (Signed)
Ordered rx refill.

## 2016-03-19 ENCOUNTER — Encounter: Payer: Self-pay | Admitting: Internal Medicine

## 2016-03-20 ENCOUNTER — Other Ambulatory Visit (INDEPENDENT_AMBULATORY_CARE_PROVIDER_SITE_OTHER): Payer: Managed Care, Other (non HMO)

## 2016-03-20 DIAGNOSIS — E039 Hypothyroidism, unspecified: Secondary | ICD-10-CM | POA: Diagnosis not present

## 2016-03-20 LAB — TSH: TSH: 2.98 u[IU]/mL (ref 0.35–4.50)

## 2016-03-20 LAB — T4, FREE: FREE T4: 0.89 ng/dL (ref 0.60–1.60)

## 2016-04-05 ENCOUNTER — Telehealth: Payer: Self-pay | Admitting: Family Medicine

## 2016-04-05 MED ORDER — ALENDRONATE SODIUM 70 MG PO TABS: ORAL_TABLET | ORAL | 1 refills | Status: DC

## 2016-04-05 NOTE — Telephone Encounter (Signed)
Sent, thanks

## 2016-04-05 NOTE — Telephone Encounter (Signed)
Patient called regarding a refill on medication alendronate (FOSAMAX) 70 MG tablet  [facility] [place], [place]  Call pt @ [phone]  Thank you!  Am

## 2016-04-10 ENCOUNTER — Ambulatory Visit: Payer: Managed Care, Other (non HMO)

## 2016-04-16 ENCOUNTER — Encounter: Payer: Self-pay | Admitting: Family Medicine

## 2016-04-16 DIAGNOSIS — Z1239 Encounter for other screening for malignant neoplasm of breast: Secondary | ICD-10-CM

## 2016-04-23 ENCOUNTER — Ambulatory Visit: Payer: Managed Care, Other (non HMO)

## 2016-05-21 ENCOUNTER — Other Ambulatory Visit: Payer: Managed Care, Other (non HMO)

## 2016-05-21 ENCOUNTER — Ambulatory Visit: Payer: Managed Care, Other (non HMO)

## 2016-05-22 ENCOUNTER — Encounter: Payer: Self-pay | Admitting: Family Medicine

## 2016-05-27 ENCOUNTER — Other Ambulatory Visit: Payer: Self-pay | Admitting: Family Medicine

## 2016-05-28 NOTE — Telephone Encounter (Signed)
Please advise on refill.

## 2016-05-28 NOTE — Telephone Encounter (Signed)
Sent to pharmacy 

## 2016-05-29 ENCOUNTER — Ambulatory Visit
Admission: RE | Admit: 2016-05-29 | Discharge: 2016-05-29 | Disposition: A | Discharge status: Home / Self Care | Payer: Managed Care, Other (non HMO) | Source: Ambulatory Visit | Attending: Internal Medicine | Admitting: Internal Medicine

## 2016-05-29 ENCOUNTER — Ambulatory Visit
Admission: RE | Admit: 2016-05-29 | Discharge: 2016-05-29 | Disposition: A | Discharge status: Home / Self Care | Payer: Managed Care, Other (non HMO) | Source: Ambulatory Visit | Attending: Family Medicine | Admitting: Family Medicine

## 2016-05-29 DIAGNOSIS — M85852 Other specified disorders of bone density and structure, left thigh: Secondary | ICD-10-CM | POA: Insufficient documentation

## 2016-05-29 DIAGNOSIS — M8588 Other specified disorders of bone density and structure, other site: Secondary | ICD-10-CM | POA: Insufficient documentation

## 2016-05-29 DIAGNOSIS — Z1231 Encounter for screening mammogram for malignant neoplasm of breast: Secondary | ICD-10-CM | POA: Insufficient documentation

## 2016-05-29 DIAGNOSIS — M810 Age-related osteoporosis without current pathological fracture: Secondary | ICD-10-CM

## 2016-05-29 DIAGNOSIS — Z1239 Encounter for other screening for malignant neoplasm of breast: Secondary | ICD-10-CM

## 2016-06-13 ENCOUNTER — Encounter: Payer: Self-pay | Admitting: Family Medicine

## 2016-06-20 ENCOUNTER — Encounter: Payer: Self-pay | Admitting: Family Medicine

## 2016-06-20 ENCOUNTER — Ambulatory Visit (INDEPENDENT_AMBULATORY_CARE_PROVIDER_SITE_OTHER): Payer: Managed Care, Other (non HMO) | Admitting: Family Medicine

## 2016-06-20 VITALS — BP 114/72 | HR 69 | Temp 98.2°F | Wt 141.0 lb

## 2016-06-20 DIAGNOSIS — E041 Nontoxic single thyroid nodule: Secondary | ICD-10-CM | POA: Diagnosis not present

## 2016-06-20 DIAGNOSIS — M810 Age-related osteoporosis without current pathological fracture: Secondary | ICD-10-CM | POA: Diagnosis not present

## 2016-06-20 DIAGNOSIS — E785 Hyperlipidemia, unspecified: Secondary | ICD-10-CM

## 2016-06-20 DIAGNOSIS — E039 Hypothyroidism, unspecified: Secondary | ICD-10-CM | POA: Diagnosis not present

## 2016-06-20 LAB — COMPREHENSIVE METABOLIC PANEL
ALBUMIN: 4.8 g/dL (ref 3.5–5.2)
ALK PHOS: 39 U/L (ref 39–117)
ALT: 27 U/L (ref 0–35)
AST: 36 U/L (ref 0–37)
BUN: 18 mg/dL (ref 6–23)
CHLORIDE: 99 meq/L (ref 96–112)
CO2: 28 mEq/L (ref 19–32)
Calcium: 10.2 mg/dL (ref 8.4–10.5)
Creatinine, Ser: 0.78 mg/dL (ref 0.40–1.20)
GFR: 78.69 mL/min (ref >60.00)
GLUCOSE: 91 mg/dL (ref 70–99)
POTASSIUM: 4.9 meq/L (ref 3.5–5.1)
SODIUM: 136 meq/L (ref 135–145)
TOTAL PROTEIN: 7.7 g/dL (ref 6.0–8.3)
Total Bilirubin: 1 mg/dL (ref 0.2–1.2)

## 2016-06-20 LAB — LIPID PANEL
CHOLESTEROL: 186 mg/dL (ref 0–200)
HDL: 80.4 mg/dL (ref >39.00)
LDL CALC: 73 mg/dL (ref 0–99)
NonHDL: 106.04
Total CHOL/HDL Ratio: 2
Triglycerides: 163 mg/dL — ABNORMAL HIGH (ref 0.0–149.0)
VLDL: 32.6 mg/dL (ref 0.0–40.0)

## 2016-06-20 LAB — VITAMIN D 25 HYDROXY (VIT D DEFICIENCY, FRACTURES): VITD: 39.36 ng/mL (ref 30.00–100.00)

## 2016-06-20 NOTE — Assessment & Plan Note (Signed)
Has had extensive workup of this. Benign results thus far. She will continue to monitor and follow with endocrinology.

## 2016-06-20 NOTE — Assessment & Plan Note (Signed)
Stable on Fosamax. Discussed taking this for 5 years and then taking a holiday from it. Discussed adequate calcium intake. Check vitamin D today.

## 2016-06-20 NOTE — Progress Notes (Signed)
Pre visit review using our clinic review tool, if applicable. No additional management support is needed unless otherwise documented below in the visit note. 

## 2016-06-20 NOTE — Patient Instructions (Signed)
Nice to see you. We will continue Synthroid and Fosamax at this time. We will check lab work today and call you with the results.

## 2016-06-20 NOTE — Progress Notes (Signed)
  Tommi Rumps, MD Phone: [phone]  Shannon [name] is a 65 y.o. female who presents today for follow-up.  HYPOTHYROIDISM Disease Monitoring Weight changes: No  Skin Changes: No Heat/Cold intolerance: No  Medication Monitoring Compliance:  Taking Synthroid   Last TSH:   Lab Results  Component Value Date   TSH 2.98 03/20/2016  Does have multinodular goiter. Followed by endocrinology. Has had biopsies and nuclear medicine scan. Benign results thus far.  Osteoporosis: Taking Fosamax once weekly. Just had a DEXA scan that revealed osteopenia. No recent fractures. Tolerating medication.  HYPERLIPIDEMIA Symptoms Chest pain on exertion:  No   Leg claudication:   No Medications: Compliance- taking simvastatin 20 mg daily Right upper quadrant pain- no  Muscle aches- no   PMH: former smoker  ROS see history of present illness  Objective  Physical Exam Vitals:   06/20/16 0916  BP: 114/72  Pulse: 69  Temp: 98.2 F (36.8 C)    BP Readings from Last 3 Encounters:  06/20/16 114/72  02/17/16 120/70  01/23/16 120/80   Wt Readings from Last 3 Encounters:  06/20/16 141 lb (64 kg)  02/17/16 141 lb (64 kg)  01/23/16 139 lb 6.4 oz (63.2 kg)    Physical Exam  Constitutional: She is well-developed, well-nourished, and in no distress.  Neck: No thyromegaly present.  Thyroid fullness noted possible prominence on the left  Cardiovascular: Normal rate, regular rhythm and normal heart sounds.   Pulmonary/Chest: Effort normal and breath sounds normal.  Musculoskeletal: She exhibits no edema.  Neurological: She is alert. Gait normal.     Assessment/Plan: Please see individual problem list.  Hypothyroidism Asymptomatic. Continue Synthroid. Continue to follow with endocrinology for thyroid nodule.  Thyroid nodule Has had extensive workup of this. Benign results thus far. She will continue to monitor and follow with endocrinology.  Osteoporosis Stable on Fosamax.  Discussed taking this for 5 years and then taking a holiday from it. Discussed adequate calcium intake. Check vitamin D today.  Hyperlipidemia Tolerating simvastatin. Check lipid panel today.   Orders Placed This Encounter  Procedures  . Comp Met (CMET)  . Lipid Profile  . Vitamin D (25 hydroxy)    Tommi Rumps, MD [place]

## 2016-06-20 NOTE — Assessment & Plan Note (Signed)
Asymptomatic. Continue Synthroid. Continue to follow with endocrinology for thyroid nodule.

## 2016-06-20 NOTE — Assessment & Plan Note (Signed)
Tolerating simvastatin. Check lipid panel today.  

## 2016-06-23 ENCOUNTER — Encounter: Payer: Self-pay | Admitting: Family Medicine

## 2016-06-29 ENCOUNTER — Encounter (INDEPENDENT_AMBULATORY_CARE_PROVIDER_SITE_OTHER): Payer: Managed Care, Other (non HMO) | Admitting: Ophthalmology

## 2016-06-29 DIAGNOSIS — H43813 Vitreous degeneration, bilateral: Secondary | ICD-10-CM

## 2016-08-13 ENCOUNTER — Encounter (INDEPENDENT_AMBULATORY_CARE_PROVIDER_SITE_OTHER): Payer: Managed Care, Other (non HMO) | Admitting: Ophthalmology

## 2016-08-13 DIAGNOSIS — H2513 Age-related nuclear cataract, bilateral: Secondary | ICD-10-CM | POA: Diagnosis not present

## 2016-08-13 DIAGNOSIS — H3562 Retinal hemorrhage, left eye: Secondary | ICD-10-CM

## 2016-08-13 DIAGNOSIS — H43813 Vitreous degeneration, bilateral: Secondary | ICD-10-CM | POA: Diagnosis not present

## 2016-08-16 ENCOUNTER — Other Ambulatory Visit: Payer: Self-pay | Admitting: Internal Medicine

## 2016-08-21 ENCOUNTER — Ambulatory Visit: Payer: Managed Care, Other (non HMO) | Admitting: Internal Medicine

## 2016-10-03 ENCOUNTER — Encounter: Payer: Self-pay | Admitting: Internal Medicine

## 2016-10-03 ENCOUNTER — Ambulatory Visit (INDEPENDENT_AMBULATORY_CARE_PROVIDER_SITE_OTHER): Payer: 59 | Admitting: Internal Medicine

## 2016-10-03 VITALS — BP 122/74 | HR 68 | Wt 140.0 lb

## 2016-10-03 DIAGNOSIS — E039 Hypothyroidism, unspecified: Secondary | ICD-10-CM

## 2016-10-03 NOTE — Patient Instructions (Signed)
Please stop Biotin and B complex and come back for thyroid labs in 5 days.  .Take the thyroid hormone every day, with water, at least 30 minutes before breakfast, separated by at least 4 hours from: - acid reflux medications - calcium - iron - multivitamins  Please return in 1 year.

## 2016-10-03 NOTE — Progress Notes (Signed)
Patient ID: Shannon [name], female   DOB: 1951/02/25, 66 y.o.   MRN: [id number]  HPI f/u for  Shannon [name] is a 66 y.o.-year-old female, returning for f/u for MNG and hypothyroidism. Last visit 8 mo ago.  Reviewed hx: Pt's PCP felt a right sided nodule at [place] in 2014 >> Thyroid U/S (05/22/2013): heterogeneous gland, multinodular goiter with  - largest nodule in left lobe, 2.5 x 1.4 x 2.4 cm, solid, slightly echogenic, moderate internal vascularity, no calcifications.  - Largest nodule on the right: 1.4 x 0.9 x 1.0 cm, predominantly  isoechoic, with mild internal vascularity. No calcifications.   We biopsied both nodules (05/2013): The 2.5 cm nodule returned as FLUS, while the 1.4 cm nodule returned benign.  A repeat biopsy of the left thyroid nodule returned again as FLUS. We perform molecular analysis with the [facility] test and this was negative for malignancy.   Pt denies feeling nodules in neck, hoarseness, dysphagia/odynophagia, SOB with lying down.  She also has a h/o mild hypothyroidism dx ~2003 >> started on levoxyl then >> but stopped subsequently as she did not feel different. She restarted LT4, on 25 mcg daily >> now on 75  Mcg since 07/2015. This is managed by her PCP.   She takes LT4 75 mcg: - in am - fasting - now black coffee - eats >30 min later - + MVI 4h later now - + Biotin - took this yesterday - taking Alendronate with it once a week - + calcium at lunchtime  I reviewed pt's most recent  thyroid tests: Lab Results  Component Value Date   TSH 2.98 03/20/2016   TSH 6.15 (H) 01/23/2016   TSH 5.97 (H) 07/28/2015   TSH 5.45 (H) 02/22/2015   TSH 7.34 (H) 06/02/2014   TSH 3.30 02/24/2014   TSH 8.35 (H) 11/24/2013   TSH 7.73 (H) 05/20/2013   FREET4 0.89 03/20/2016   FREET4 0.97 01/23/2016   FREET4 0.76 07/28/2015   FREET4 0.78 06/02/2014   FREET4 1.18 02/24/2014   FREET4 0.61 11/24/2013   FREET4 0.65 05/20/2013    Pt c/o: - + hair loss - no heat  intolerance/cold intolerance - no tremors - no palpitations - no hyperdefecation/constipation - no weight loss/gain - no problems with concentration - no fatigue - no anxiety/no depression  She also has a history of osteoporosis - on Alendronate, HL - on Zocor.  ROS: Constitutional: no weight gain/loss, no fatigue, no subjective hyperthermia/hypothermia Eyes: no blurry vision, no xerophthalmia ENT: no sore throat, no nodules palpated in throat, no dysphagia/odynophagia, no hoarseness Cardiovascular: no CP/SOB/palpitations/leg swelling Respiratory: no cough/SOB Gastrointestinal: no N/V/D/C Musculoskeletal: no muscle/joint aches Skin: no rashes, + hair loss Neurological: no tremors/numbness/tingling/dizziness  I reviewed pt's medications, allergies, PMH, social hx, family hx, and changes were documented in the history of present illness. Otherwise, unchanged from my initial visit note:  Past Medical History:  Diagnosis Date  . GERD (gastroesophageal reflux disease)   . Hyperlipidemia   . Thyroid disease    No past surgical history on file. History   Social History  . Marital Status: Married    Spouse Name: N/A    Number of Children: 1   Occupational History  . retail.   Social History Main Topics  . Smoking status: Former Smoker    Types: Cigarettes    Quit date: 01/18/1989  . Smokeless tobacco: Never Used  . Alcohol Use: 4.2 oz/week    7 Glasses of wine per week  .  Drug Use: No  . Sexual Activity: Yes    Partners: Male   Social History Narrative   Lives in [place] with husband. 48YO son, lives in [place], Ship broker. Dog in home. Born in [place], raised in [place]. [place]. Previously lived in [place].      Diet - regular diet      Exercise - walking   Caffeine: coffee in the morning; cup of hot tea      Hobbies - yardwork      Work - [employer] in [place]   Current Outpatient Prescriptions on File Prior to Visit  Medication Sig Dispense Refill  .  alendronate (FOSAMAX) 70 MG tablet TAKE 1 TABLET BY MOUTH EVERY 7 DAYS. TAKE WITH A FULL GLASS OF WATER ON AN EMPTY STOMACH. 12 tablet 3  . b complex vitamins capsule Take by mouth.    Marland Kitchen BIOTIN PO Take by mouth.    . levothyroxine (SYNTHROID, LEVOTHROID) 75 MCG tablet TAKE ONE TABLET BY MOUTH DAILY 30 tablet 1  . simvastatin (ZOCOR) 20 MG tablet Take 1 tablet (20 mg total) by mouth every evening. 90 tablet 3   No current facility-administered medications on file prior to visit.    No Known Allergies Family History  Problem Relation Age of Onset  . Breast cancer Mother 43  . Heart disease Father   . Cancer Father     Prostate  . Diabetes Father     diagnosed later in life  . Prostate cancer Father   . Asthma Brother    PE: There were no vitals taken for this visit. Wt Readings from Last 3 Encounters:  06/20/16 141 lb (64 kg)  02/17/16 141 lb (64 kg)  01/23/16 139 lb 6.4 oz (63.2 kg)   Constitutional: overweight, in NAD Eyes: PERRLA, EOMI, no exophthalmos ENT: moist mucous membranes, tonsillomegaly, no thyromegaly, Thyroid fullness bilaterally, no cervical lymphadenopathy Cardiovascular: RRR, No MRG Respiratory: CTA B Gastrointestinal: abdomen soft, NT, ND, BS+ Musculoskeletal: no deformities, strength intact in all 4;  Skin: moist, warm, no rashes Neurological: no tremor with outstretched hands, DTR normal in all 4  ASSESSMENT: 1. MNG - Thyroid U/S (05/22/2013): heterogeneous gland, multinodular goiter - largest nodule in left lobe: 2.5 x 1.4 x 2.4 cm, solid, slightly echogenic, moderate internal vascularity, no calcifications.  - largest nodule on the right: 1.4 x 0.9 x 1.0 cm, predominantly  isoechoic, with mild internal vascularity, no calcifications.   - FNA both nodules (06/12/2013): Adequacy Reason Satisfactory For Evaluation. Diagnosis THYROID, FINE NEEDLE ASPIRATION LEFT, (SPECIMEN 1 OF 2, COLLECTED ON 06/11/2013). 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 Claudette Laws MD Pathologist, Electronic Signature (Case signed 06/12/2013) Specimen Clinical Information Nontoxic uninodular goiter, nodule, 2.5 x 1.4 x 2.4cm dominant left nodule   Adequacy Reason Satisfactory For Evaluation. Diagnosis THYROID, FINE NEEDLE ASPIRATION RIGHT, (SPECIMEN 2 OF 2, COLLECTED ON 11/20 2014). BENIGN. FINDINGS CONSISTENT WITH GOITER. FINDINGS CONSISTENT WITH THE CONTENTS OF A CYST. Claudette Laws MD Pathologist, Electronic Signature (Case signed 06/12/2013) Specimen Clinical Information Nontoxic uninodular goiter, Nodule, 1.4 x 0.9 x 1.0cm domnant right lobe thyroid nodule  The first nodule was characterized as consistent with a follicular neoplasm and/or lesion. This is not an entity described in the Bethesda criteria. I was not sure how to interpret this. The treatment plan for a follicular lesion would be to repeat FNA, while for follicular neoplasm would be surgical lobectomy.  I discussed with the pathologist >> he suggested that we interpret  this as FLUS.  Thyroid U/S (02/21/2015):  Right thyroid lobe Measurements: 5.0 cm x 1.5 cm x 1.8 cm. Heterogeneous thyroid with relatively increased flow.  - Superior nodule measures 7 mm x 6 mm x 8 mm. - Previously biopsied lower right thyroid nodule measures 1.2 cm x 1.0 cm x 1.3 cm. Nodule is solid with internal reflectors.  Left thyroid lobe Measurements: 6.3 cm x 2.0 cm x 1.9 cm.  Heterogeneous left thyroid with relatively increased flow. - Nodule at the inferior aspect has been previously biopsied and measures 2.9 cm x 1.8 cm x 2.8 cm.  Isthmus Thickness: 3 mm. No nodules visualized.  Lymphadenopathy: None visualized.  Left thyroid nodule has increased in size. Due to previous history of follicular lesion of undetermined significance, I suggested repeat biopsy with [facility] molecular testing.     03/03/2015: FNA: Adequacy Reason Satisfactory For Evaluation. Diagnosis THYROID, FINE  NEEDLE ASPIRATION LEFT LOBE (SPECIMEN 1 OF 1, COLLECTED ON 03/02/2015): ATYPIA OF UNDETERMINED SIGNIFICANCE OR FOLLICULAR LESION OF UNDETERMINED SIGNIFICANCE (BETHESDA CATEGORY III). COMMENT: THE SPECIMEN IS HYPERCELLULAR AND CONSISTS OF SMALL TO LARGE GROUPS OF FOLLICULAR EPITHELIAL CELLS WITH FOCAL HURTHLE CELL CHANGES. THERE IS MILD CYTOLOGIC ATYPIA, INCLUDING INTRANUCLEAR GROOVES. BASED ON THESE FEATURES, A FOLLICULAR LESION/NEOPLASM CAN NOT BE ENTIRELY RULED OUT. Specimen Clinical Information Nodule at the inferior aspect has been previously biopsied and measures 2.9 cm x 1.8 cm x 2.8 cm Source Thyroid, Fine Needle Aspiration, Left Lobe, (Specimen 1 of 1, collected on 03/02/15 )  Afirma test results are benign.  2. Hypothyroidism - Uncontrolled - On LT4 75 g daily  PLAN: 1. MNG and FLUS - I reviewed the report of her previous ultrasounds along with the pt:  1. The 1.4 cm nodule is a cyst, so the risk of cancer is almost 0 considering also the results of the FNA obtained in 05/2013.  2. The 2.5 cm nodule is solid, without calcifications, with mild-moderate internal blood flow, more wide than tall. This nodule was biopsied and this returned as intermediate probability for cancer (follicular lesion of undetermined significance, FLUS). We repeated a biopsy with the same result. However, an [facility] test returned benign, which is associated with a risk of cancer less than 1-2%.  - We can continue to follow her on a yearly basis, and check another ultrasound in 5 years from previous (2021) - I'll see her back in a year  2. Hypothyroidism - uncontrolled - Her TSH was normal at last check 02/2016 - Upon questioning, at last visit, she was not taking her levothyroxine correctly: was taking it close to coffee with milk, taking multivitamins 2 hours later rather than 4, and skipping levothyroxine once a week when she takes alendronate. We moved these at optimum intervals from her LT4 >> subsequent  TSH was normal - we again discussed about taking the thyroid hormone every day, with water, at least 30 minutes before breakfast, separated by at least 4 hours from: - acid reflux medications - calcium - iron - multivitamins She is now taking it correctly. - We'll have her back in 5 days for labs >> I advised her to skip biotin + B cplx for 5 days before labs  Needs refills x 1 year.  Orders Placed This Encounter  Procedures  . T4, free  . TSH   Philemon Kingdom, MD PhD [facility] Endocrinology

## 2016-10-05 ENCOUNTER — Encounter: Payer: Self-pay | Admitting: Family Medicine

## 2016-10-08 ENCOUNTER — Other Ambulatory Visit (INDEPENDENT_AMBULATORY_CARE_PROVIDER_SITE_OTHER): Payer: 59

## 2016-10-08 DIAGNOSIS — E039 Hypothyroidism, unspecified: Secondary | ICD-10-CM | POA: Diagnosis not present

## 2016-10-09 LAB — TSH: TSH: 2.71 u[IU]/mL (ref 0.35–4.50)

## 2016-10-09 LAB — T4, FREE: Free T4: 0.9 ng/dL (ref 0.60–1.60)

## 2016-10-13 ENCOUNTER — Other Ambulatory Visit: Payer: Self-pay | Admitting: Internal Medicine

## 2016-11-10 ENCOUNTER — Other Ambulatory Visit: Payer: Self-pay | Admitting: Internal Medicine

## 2016-12-11 ENCOUNTER — Other Ambulatory Visit: Payer: Self-pay | Admitting: Internal Medicine

## 2017-01-06 ENCOUNTER — Other Ambulatory Visit: Payer: Self-pay | Admitting: Internal Medicine

## 2017-02-02 ENCOUNTER — Other Ambulatory Visit: Payer: Self-pay | Admitting: Internal Medicine

## 2017-02-19 ENCOUNTER — Other Ambulatory Visit: Payer: Self-pay

## 2017-02-19 MED ORDER — SIMVASTATIN 20 MG PO TABS: 20.0000 mg | ORAL_TABLET | Freq: Every evening | ORAL | 3 refills | Status: DC

## 2017-02-19 NOTE — Telephone Encounter (Signed)
Last OV 06/20/16 last filled by Dr.Walker 01/23/16 90 [id number]

## 2017-02-27 IMAGING — US US THYROID BIOPSY
1 series · 14 of 16 positions shown · non-contrast
Comparison: None.

CLINICAL DATA: Left thyroid nodule

EXAM:
ULTRASOUND GUIDED NEEDLE ASPIRATE BIOPSY OF THE THYROID GLAND

[Series 1: us thyroid biopsy · 0.07mm/px · 16 acquisitions, 14 frames shown]
[im 1/16]
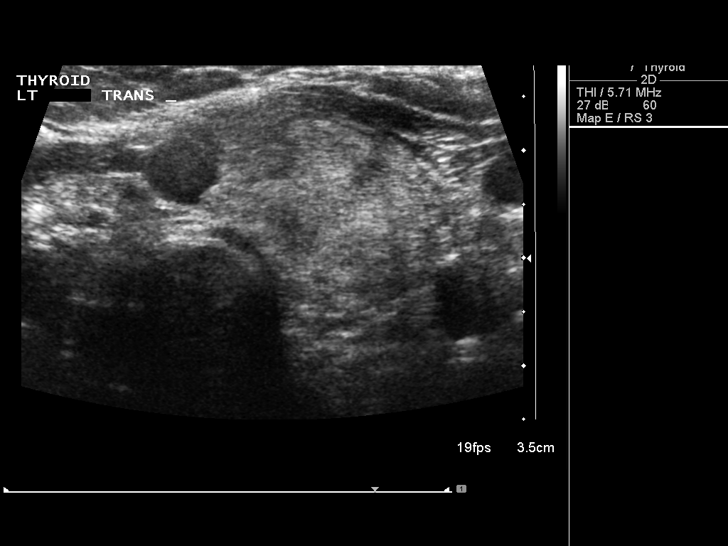
[im 2/16]
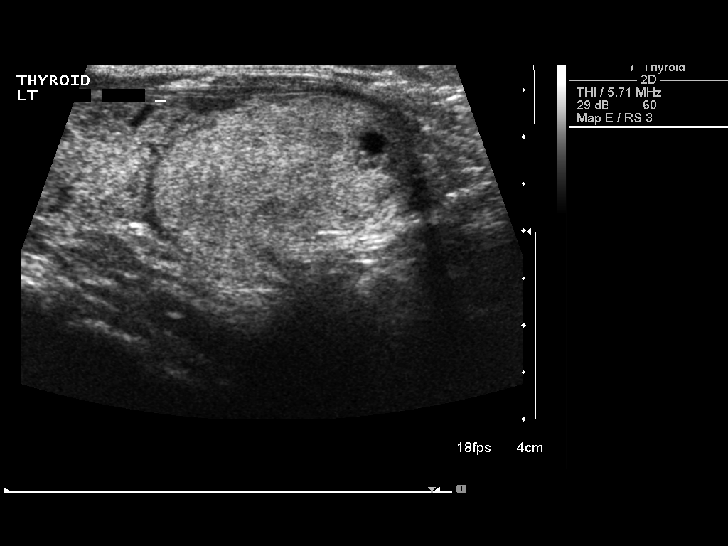
[im 3/16]
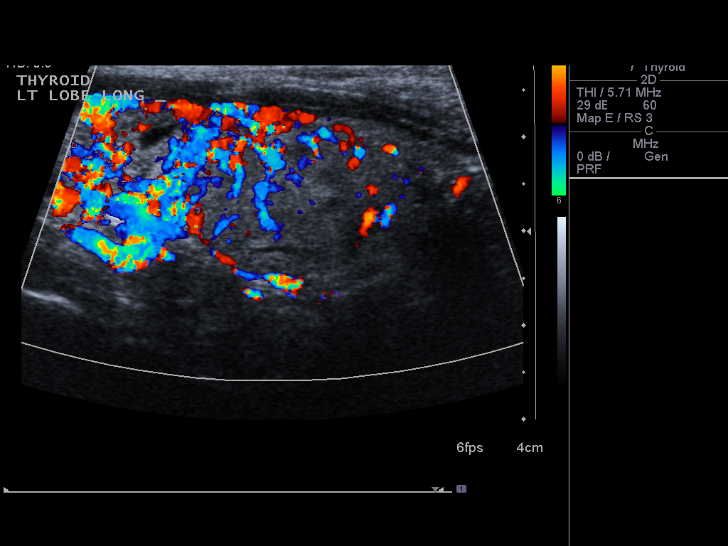
[im 5/16]
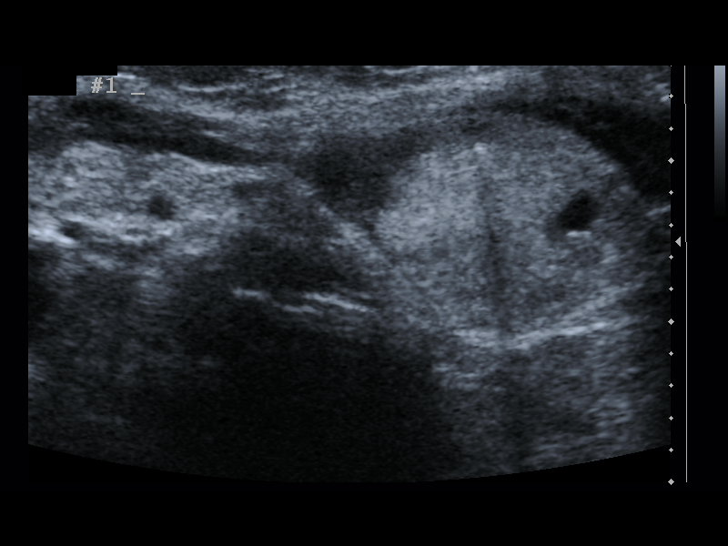
[im 6/16]
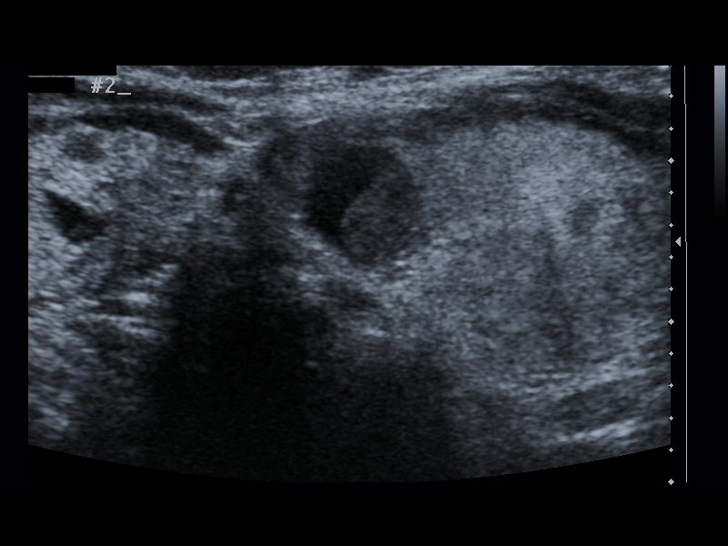
[im 7/16]
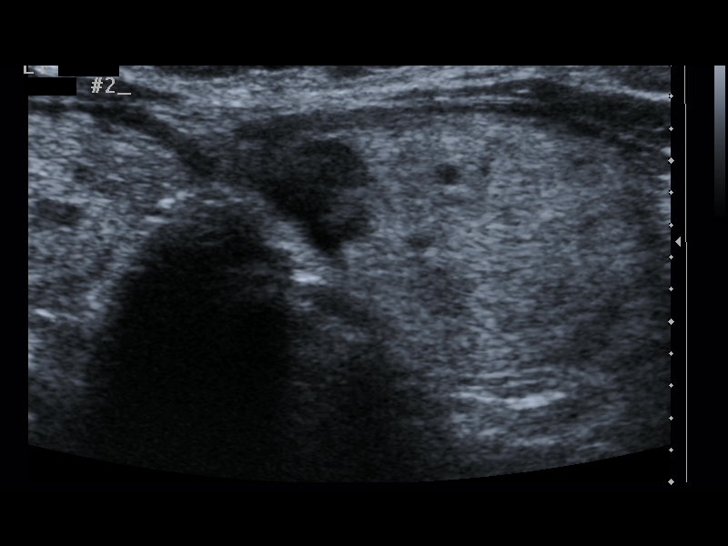
[im 8/16]
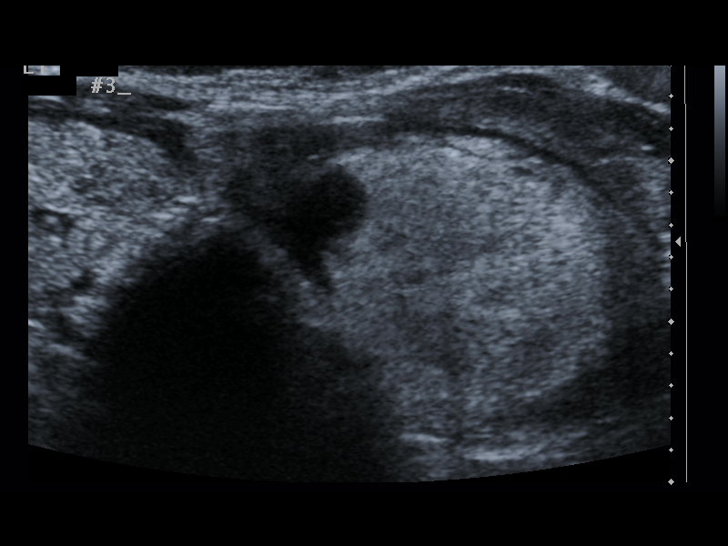
[im 9/16]
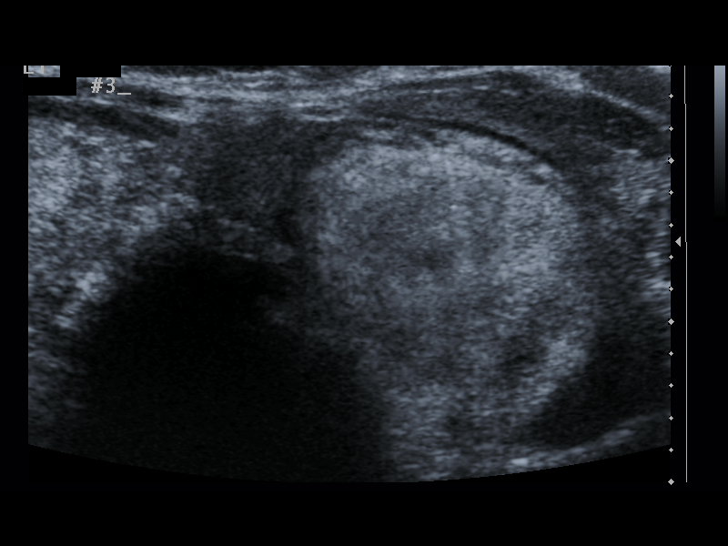
[im 10/16]
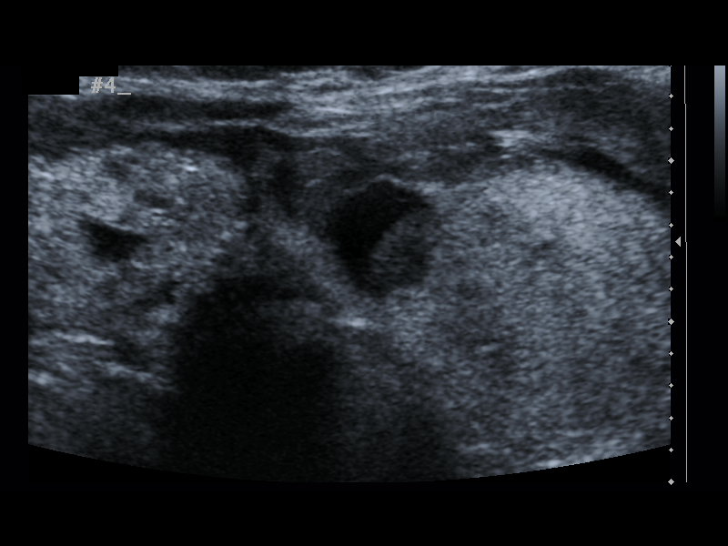
[im 11/16]
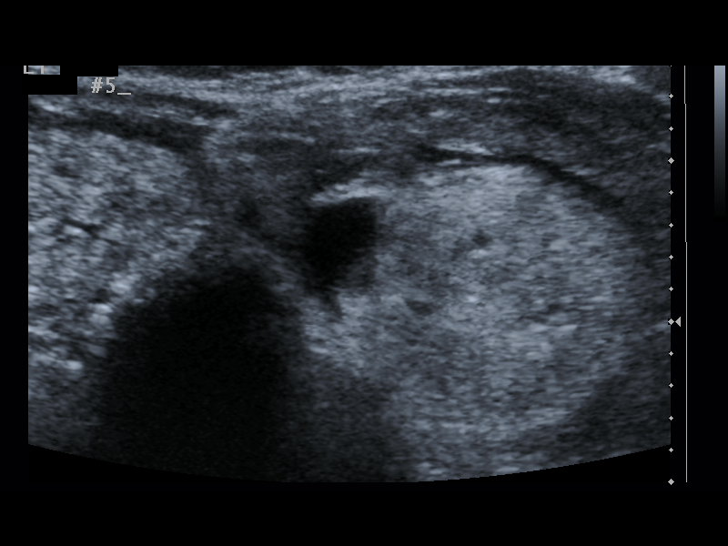
[im 13/16]
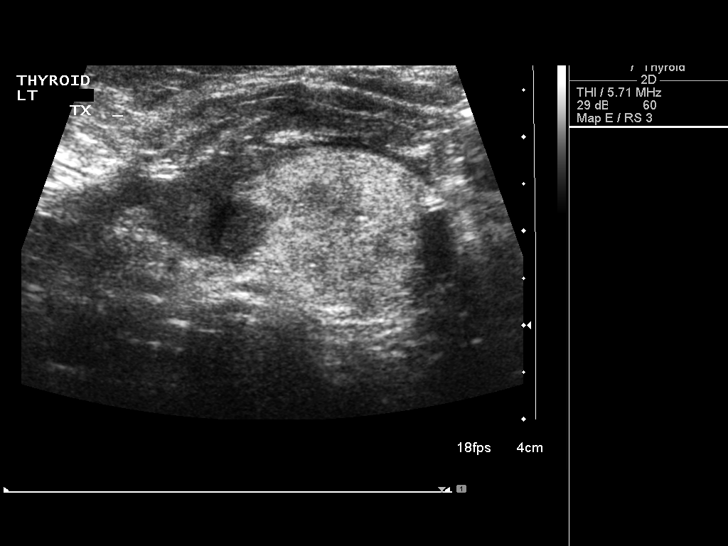
[im 14/16]
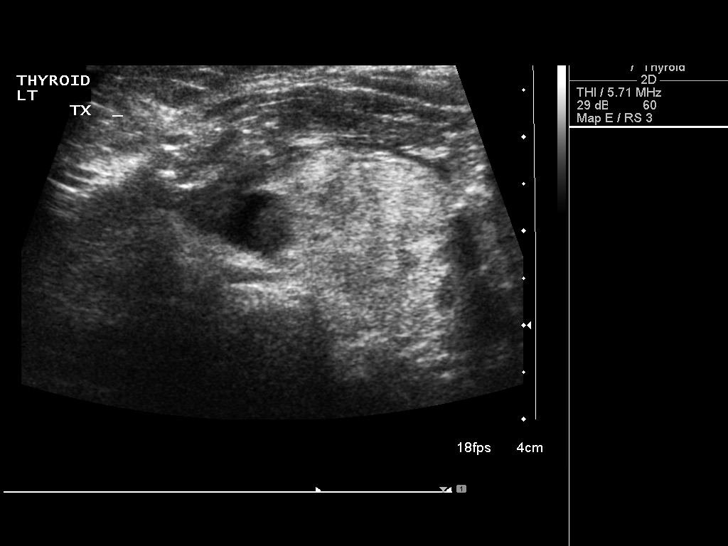
[im 15/16]
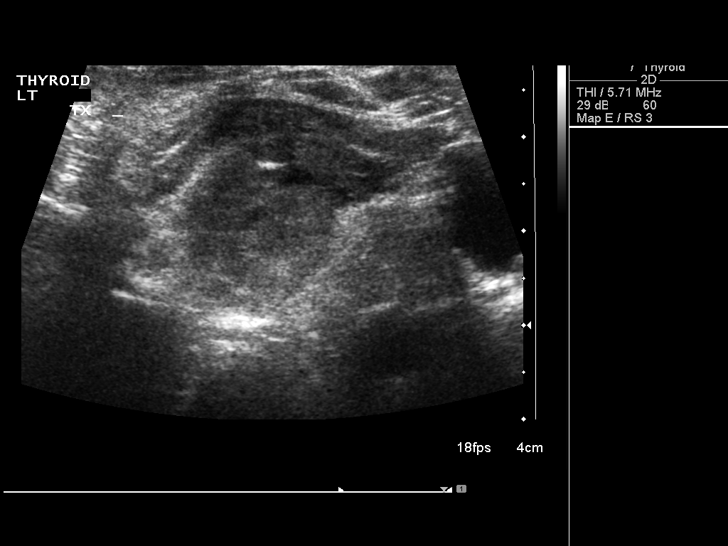
[im 16/16]
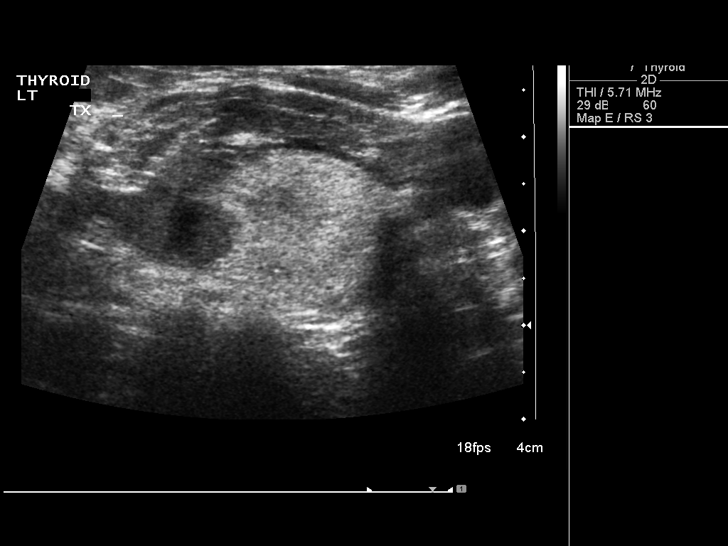

[14 of 16 positions shown; findings below may reference images not displayed]

PROCEDURE:
Thyroid biopsy was thoroughly discussed with the patient and
questions were answered. The benefits, risks, alternatives, and
complications were also discussed. The patient understands and
wishes to proceed with the procedure. Written consent was obtained.

Ultrasound was performed to localize and mark an adequate site for
the biopsy. The patient was then prepped and draped in a normal
sterile fashion. Local anesthesia was provided with 1% lidocaine.
Using direct ultrasound guidance, 5 passes were made using needles
into the nodule within the left lobe of the thyroid. Ultrasound was
used to confirm needle placements on all occasions. Specimens were
sent to Pathology for analysis.

COMPLICATIONS:
None.
FINDINGS: Images document needle placement in the dominant left thyroid
nodule.
IMPRESSION: Ultrasound guided needle aspirate biopsy performed of the left
thyroid nodule.

## 2017-03-03 ENCOUNTER — Other Ambulatory Visit: Payer: Self-pay | Admitting: Internal Medicine

## 2017-03-30 ENCOUNTER — Other Ambulatory Visit: Payer: Self-pay | Admitting: Internal Medicine

## 2017-04-03 DIAGNOSIS — L309 Dermatitis, unspecified: Secondary | ICD-10-CM | POA: Insufficient documentation

## 2017-04-28 ENCOUNTER — Other Ambulatory Visit: Payer: Self-pay | Admitting: Internal Medicine

## 2017-05-16 ENCOUNTER — Other Ambulatory Visit: Payer: Self-pay | Admitting: Family Medicine

## 2017-06-08 ENCOUNTER — Other Ambulatory Visit: Payer: Self-pay | Admitting: Internal Medicine

## 2017-09-04 ENCOUNTER — Other Ambulatory Visit: Payer: Self-pay | Admitting: Internal Medicine

## 2017-09-05 ENCOUNTER — Telehealth: Payer: Self-pay | Admitting: *Deleted

## 2017-09-05 ENCOUNTER — Telehealth: Payer: Self-pay | Admitting: Radiology

## 2017-09-05 DIAGNOSIS — M810 Age-related osteoporosis without current pathological fracture: Secondary | ICD-10-CM

## 2017-09-05 DIAGNOSIS — E785 Hyperlipidemia, unspecified: Secondary | ICD-10-CM

## 2017-09-05 DIAGNOSIS — E039 Hypothyroidism, unspecified: Secondary | ICD-10-CM

## 2017-09-05 NOTE — Telephone Encounter (Signed)
Copied from [place] [id number]. Topic: Appointment Scheduling - Scheduling Inquiry for Clinic >> Sep 05, 2017 12:16 PM Burnis Medin, NT wrote: Reason for CRM: Patient called and wanted to get scheduled for labs before her CPE on 2/19. Pls call patient back for scheduling.

## 2017-09-05 NOTE — Telephone Encounter (Signed)
Pt coming in for labs tomorrow, please place future orders. Thank you.  

## 2017-09-05 NOTE — Telephone Encounter (Signed)
Patient is scheduled for fasting labs, please place orders

## 2017-09-06 ENCOUNTER — Other Ambulatory Visit (INDEPENDENT_AMBULATORY_CARE_PROVIDER_SITE_OTHER): Payer: 59

## 2017-09-06 DIAGNOSIS — E785 Hyperlipidemia, unspecified: Secondary | ICD-10-CM | POA: Diagnosis not present

## 2017-09-06 DIAGNOSIS — M810 Age-related osteoporosis without current pathological fracture: Secondary | ICD-10-CM | POA: Diagnosis not present

## 2017-09-06 DIAGNOSIS — E039 Hypothyroidism, unspecified: Secondary | ICD-10-CM

## 2017-09-06 LAB — COMPREHENSIVE METABOLIC PANEL
ALT: 22 U/L (ref 0–35)
AST: 32 U/L (ref 0–37)
Albumin: 4.4 g/dL (ref 3.5–5.2)
Alkaline Phosphatase: 36 U/L — ABNORMAL LOW (ref 39–117)
BILIRUBIN TOTAL: 1.2 mg/dL (ref 0.2–1.2)
BUN: 17 mg/dL (ref 6–23)
CALCIUM: 9.1 mg/dL (ref 8.4–10.5)
CO2: 27 meq/L (ref 19–32)
CREATININE: 0.76 mg/dL (ref 0.40–1.20)
Chloride: 99 mEq/L (ref 96–112)
GFR: 80.79 mL/min (ref >60.00)
GLUCOSE: 93 mg/dL (ref 70–99)
Potassium: 4.2 mEq/L (ref 3.5–5.1)
Sodium: 135 mEq/L (ref 135–145)
Total Protein: 7.3 g/dL (ref 6.0–8.3)

## 2017-09-06 LAB — LIPID PANEL
CHOL/HDL RATIO: 2
Cholesterol: 162 mg/dL (ref 0–200)
HDL: 73.1 mg/dL (ref >39.00)
LDL Cholesterol: 69 mg/dL (ref 0–99)
NONHDL: 89.02
Triglycerides: 102 mg/dL (ref 0.0–149.0)
VLDL: 20.4 mg/dL (ref 0.0–40.0)

## 2017-09-06 LAB — VITAMIN D 25 HYDROXY (VIT D DEFICIENCY, FRACTURES): VITD: 35.11 ng/mL (ref 30.00–100.00)

## 2017-09-06 LAB — TSH: TSH: 3.22 u[IU]/mL (ref 0.35–4.50)

## 2017-09-06 LAB — T4, FREE: Free T4: 1.33 ng/dL (ref 0.60–1.60)

## 2017-09-06 NOTE — Telephone Encounter (Signed)
Ordered

## 2017-09-06 NOTE — Addendum Note (Signed)
Addended by: Leone Haven on: 09/06/2017 08:26 AM   Modules accepted: Orders

## 2017-09-06 NOTE — Telephone Encounter (Signed)
2nd message. Pt coming in today for labs please place future orders.

## 2017-09-10 ENCOUNTER — Encounter: Payer: 59 | Admitting: Family Medicine

## 2017-09-10 ENCOUNTER — Ambulatory Visit (INDEPENDENT_AMBULATORY_CARE_PROVIDER_SITE_OTHER): Payer: 59 | Admitting: Family Medicine

## 2017-09-10 ENCOUNTER — Other Ambulatory Visit: Payer: Self-pay

## 2017-09-10 ENCOUNTER — Encounter: Payer: Self-pay | Admitting: Family Medicine

## 2017-09-10 VITALS — BP 110/70 | HR 82 | Temp 97.9°F | Ht 65.0 in | Wt 140.8 lb

## 2017-09-10 DIAGNOSIS — Z1239 Encounter for other screening for malignant neoplasm of breast: Secondary | ICD-10-CM

## 2017-09-10 DIAGNOSIS — Z1231 Encounter for screening mammogram for malignant neoplasm of breast: Secondary | ICD-10-CM | POA: Diagnosis not present

## 2017-09-10 DIAGNOSIS — Z23 Encounter for immunization: Secondary | ICD-10-CM | POA: Diagnosis not present

## 2017-09-10 DIAGNOSIS — E785 Hyperlipidemia, unspecified: Secondary | ICD-10-CM

## 2017-09-10 DIAGNOSIS — L659 Nonscarring hair loss, unspecified: Secondary | ICD-10-CM

## 2017-09-10 DIAGNOSIS — Z0001 Encounter for general adult medical examination with abnormal findings: Secondary | ICD-10-CM

## 2017-09-10 NOTE — Assessment & Plan Note (Addendum)
Physical exam completed.  Encourage diet and exercise.  Encouraged cutting back to 1 alcoholic beverage nightly.  Mammogram ordered.  Patient declined breast exam today.  No need for pelvic exam given that she is postmenopausal with no prior history of abnormal Pap smears.  Prevnar given.  Shingrix given.  Discussed these vaccinations with the patient.  Lab work from recently reviewed.

## 2017-09-10 NOTE — Patient Instructions (Signed)
Nice to see you. We will get you set up for mammogram. You are not due for bone density scan until November of this year.

## 2017-09-10 NOTE — Progress Notes (Signed)
Tommi Rumps, MD Phone: [phone]  Shannon [name] is a 67 y.o. female who presents today for physical exam.  Exercises a couple days a week by walking. Diet is fairly healthy. No prior history of abnormal Pap smears.  She is postmenopausal. Due for mammogram. DEXA scan up-to-date. Colonoscopy 2016 and she reports she was advised 5-year recall. Tetanus vaccination up-to-date.  Flu vaccination up-to-date.  Due for Prevnar and Shingrix. 2 alcoholic beverages a day.  No illicit drug use or tobacco use.  She reports issues with dry skin and hair loss for some time now.  She has undergone multiple treatments with dermatology and notes the skin has improved though the hair loss remains.  Notes itchy scalp.  No dandruff.  Active Ambulatory Problems    Diagnosis Date Noted  . Hyperlipidemia 05/20/2013  . Hypothyroidism 05/20/2013  . Osteoporosis 05/20/2013  . Thyroid nodule 05/20/2013  . Hair loss 06/02/2014  . Encounter for general adult medical examination with abnormal findings 09/10/2017   Resolved Ambulatory Problems    Diagnosis Date Noted  . Need for prophylactic vaccination and inoculation against influenza 05/20/2013  . UTI (urinary tract infection) 11/09/2013  . Routine general medical examination at a health care facility 11/24/2013  . Screening for breast cancer 11/24/2013   Past Medical History:  Diagnosis Date  . GERD (gastroesophageal reflux disease)   . Hyperlipidemia   . Thyroid disease     Family History  Problem Relation Age of Onset  . Breast cancer Mother 89  . Heart disease Father   . Cancer Father        Prostate  . Diabetes Father        diagnosed later in life  . Prostate cancer Father   . Asthma Brother     Social History   Socioeconomic History  . Marital status: Married    Spouse name: Not on file  . Number of children: Not on file  . Years of education: Not on file  . Highest education level: Not on file  Social Needs  .  Financial resource strain: Not on file  . Food insecurity - worry: Not on file  . Food insecurity - inability: Not on file  . Transportation needs - medical: Not on file  . Transportation needs - non-medical: Not on file  Occupational History  . Not on file  Tobacco Use  . Smoking status: Former Smoker    Types: Cigarettes    Last attempt to quit: 01/18/1989    Years since quitting: 28.6  . Smokeless tobacco: Never Used  Substance and Sexual Activity  . Alcohol use: Yes    Alcohol/week: 4.2 oz    Types: 7 Glasses of wine per week  . Drug use: No  . Sexual activity: Yes    Partners: Male  Other Topics Concern  . Not on file  Social History Narrative   Lives in [place] with husband. 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 Dog in home. Born in [place], raised in [place]. [place]. Previously lived in [place].      Diet - regular diet      Exercise - walking   Caffeine: coffee in the morning; cup of hot tea      Hobbies - yardwork      Work - [employer] in [place]:  Negative for nexplained weight loss, fever Skin: Negative for new or changing mole, sore that won't heal HEENT: Negative for trouble hearing, trouble  seeing, ringing in ears, mouth sores, hoarseness, change in voice, dysphagia. CV:  Negative for chest pain, dyspnea, edema, palpitations Resp: Negative for cough, dyspnea, hemoptysis GI: Negative for nausea, vomiting, diarrhea, constipation, abdominal pain, melena, hematochezia. GU: Negative for dysuria, incontinence, urinary hesitance, hematuria, vaginal or penile discharge, polyuria, sexual difficulty, lumps in testicle or breasts MSK: Negative for muscle cramps or aches, joint pain or swelling Neuro: Negative for headaches, weakness, numbness, dizziness, passing out/fainting Psych: Negative for depression, anxiety, memory problems  Objective  Physical Exam Vitals:   09/10/17 1551  BP: 110/70  Pulse: 82  Temp: 97.9 F (36.6 C)  SpO2: 95%     BP Readings from Last 3 Encounters:  09/10/17 110/70  10/03/16 122/74  06/20/16 114/72   Wt Readings from Last 3 Encounters:  09/10/17 140 lb 12.8 oz (63.9 kg)  10/03/16 140 lb (63.5 kg)  06/20/16 141 lb (64 kg)    Physical Exam  Constitutional: No distress.  HENT:  Head: Normocephalic and atraumatic.  Mouth/Throat: Oropharynx is clear and moist.  Eyes: Conjunctivae are normal. Pupils are equal, round, and reactive to light.  Neck: Neck supple.  Cardiovascular: Normal rate, regular rhythm and normal heart sounds.  Pulmonary/Chest: Effort normal and breath sounds normal.  Abdominal: Soft. Bowel sounds are normal. She exhibits no distension. There is no tenderness. There is no rebound and no guarding.  Musculoskeletal: She exhibits no edema.  Lymphadenopathy:    She has no cervical adenopathy.  Neurological: She is alert. Gait normal.  Skin: Skin is warm and dry. She is not diaphoretic.  Psychiatric: Mood and affect normal.     Assessment/Plan:   Encounter for general adult medical examination with abnormal findings Physical exam completed.  Encourage diet and exercise.  Encouraged cutting back to 1 alcoholic beverage nightly.  Mammogram ordered.  Patient declined breast exam today.  No need for pelvic exam given that she is postmenopausal with no prior history of abnormal Pap smears.  Prevnar given.  Shingrix given.  Discussed these vaccinations with the patient.  Lab work from recently reviewed.  Hair loss Chronic issue.  Somewhat worse now.  She will continue to follow with dermatology.  Her thyroid hormones are normal.  Hyperlipidemia Patient is interested in cutting down on simvastatin to 10 mg daily.  I discussed this would be reasonable though she would need to return in 1-2 months to have her LDL rechecked.   Orders Placed This Encounter  Procedures  . MM SCREENING BREAST TOMO BILATERAL    Standing Status:   Future    Standing Expiration Date:   11/09/2018     Order Specific Question:   Reason for Exam (SYMPTOM  OR DIAGNOSIS REQUIRED)    Answer:   breast cancer screening    Order Specific Question:   Preferred imaging location?    Answer:   [facility]  . Varicella-zoster vaccine IM (Shingrix)  . Pneumococcal conjugate vaccine 13-valent  . LDL cholesterol, direct    Standing Status:   Future    Standing Expiration Date:   09/10/2018    No orders of the defined types were placed in this encounter.    Tommi Rumps, MD [place]

## 2017-09-10 NOTE — Assessment & Plan Note (Signed)
Chronic issue.  Somewhat worse now.  She will continue to follow with dermatology.  Her thyroid hormones are normal.

## 2017-09-10 NOTE — Assessment & Plan Note (Addendum)
Patient is interested in cutting down on simvastatin to 10 mg daily.  I discussed this would be reasonable though she would need to return in 1-2 months to have her LDL rechecked.

## 2017-09-12 ENCOUNTER — Encounter: Payer: Self-pay | Admitting: Family Medicine

## 2017-09-15 NOTE — Telephone Encounter (Signed)
Shannon [name], please see the patients message regarding her rash. Would this preclude her from getting the second part of the shingrix vaccine?

## 2017-09-17 ENCOUNTER — Telehealth: Payer: Self-pay | Admitting: Family Medicine

## 2017-09-17 NOTE — Telephone Encounter (Signed)
Please let the patient know that I heard back from [facility] regarding the rash and the shingrix vaccine. She should see the dermatologist as planned and see what they think of the rash. At this point unless they state the rash was not a drug related rash I would not have her complete the second part of the shingrix vaccine. Thanks.

## 2017-09-18 NOTE — Telephone Encounter (Signed)
Patient states she saw Dermatology and they did not seem very concerned about her having had the shingrix vaccine, she states they did not state what the rash was or where it could have came from but she does have an appointment with dermatology again in 2 weeks and will follow up with them at that time about it. Patient states she believes it was related to the shingrix and is ok with holding off on the second dose

## 2017-09-19 NOTE — Telephone Encounter (Signed)
Patient states she will have them fax over notes

## 2017-09-19 NOTE — Telephone Encounter (Signed)
Noted and agree with holding off on second shingrix vaccine. Please see if she can have dermatology send their notes to [place]. Thanks.

## 2017-09-30 ENCOUNTER — Ambulatory Visit
Admission: RE | Admit: 2017-09-30 | Discharge: 2017-09-30 | Disposition: A | Discharge status: Home / Self Care | Payer: 59 | Source: Ambulatory Visit | Attending: Family Medicine | Admitting: Family Medicine

## 2017-09-30 DIAGNOSIS — Z1231 Encounter for screening mammogram for malignant neoplasm of breast: Secondary | ICD-10-CM | POA: Insufficient documentation

## 2017-09-30 DIAGNOSIS — Z1239 Encounter for other screening for malignant neoplasm of breast: Secondary | ICD-10-CM

## 2017-10-03 ENCOUNTER — Ambulatory Visit: Payer: 59 | Admitting: Internal Medicine

## 2017-10-03 ENCOUNTER — Encounter: Payer: Self-pay | Admitting: Internal Medicine

## 2017-10-03 VITALS — BP 122/74 | HR 96 | Ht 65.0 in | Wt 143.6 lb

## 2017-10-03 DIAGNOSIS — E039 Hypothyroidism, unspecified: Secondary | ICD-10-CM | POA: Diagnosis not present

## 2017-10-03 DIAGNOSIS — E041 Nontoxic single thyroid nodule: Secondary | ICD-10-CM | POA: Diagnosis not present

## 2017-10-03 NOTE — Patient Instructions (Addendum)
Please stop Biotin x 1 week and go to the lab to recheck the thyroid tests.  Please continue Levothyroxine 75 mcg daily.  Take the thyroid hormone every day, with water, at least 30 minutes before breakfast, separated by at least 4 hours from: - acid reflux medications - calcium - iron - multivitamins  Please come back for a follow-up appointment in 1 year.

## 2017-10-03 NOTE — Progress Notes (Addendum)
Patient ID: Shannon [name], female   DOB: 1951/01/28, 67 y.o.   MRN: [id number]  HPI f/u for  Shannon [name] is a 67 y.o.-year-old female, returning for f/u for MNG and hypothyroidism. Last visit 1 year ago.  Reviewed history: Pt's PCP felt a right sided nodule at [place] in 2014 >> Thyroid U/S (05/22/2013): heterogeneous gland, multinodular goiter with  - largest nodule in left lobe, 2.5 x 1.4 x 2.4 cm, solid, slightly echogenic, moderate internal vascularity, no calcifications.  - Largest nodule on the right: 1.4 x 0.9 x 1.0 cm, predominantly  isoechoic, with mild internal vascularity. No calcifications.   We biopsied both nodules (05/2013): The 2.5 cm nodule returned as FLUS, while the 1.4 cm nodule returned benign.  2016: A repeat biopsy of the left thyroid nodule returned again as FLUS. We perform molecular analysis with the [facility] test and this was negative for malignancy.  Hypothyroidism  - dx ~2003 >> started on levoxyl then >> but stopped subsequently as she did not feel different. She restarted LT4, on 25 mcg daily >>  now on 75 mcg since 07/2015.  She was initially not taking this correctly,  but now she takes it as advised.  Pt is on levothyroxine 75 mcg daily, taken: - in am - fasting - at least 30 min from b'fast - + Ca, MVI at the end of the day - No Fe - no PPIs - + on Biotin, B complex  I reviewed patient's TFTs: Normalized after she started to take the levothyroxine correctly. Lab Results  Component Value Date   TSH 3.22 09/06/2017   TSH 2.71 10/08/2016   TSH 2.98 03/20/2016   TSH 6.15 (H) 01/23/2016   TSH 5.97 (H) 07/28/2015   TSH 5.45 (H) 02/22/2015   TSH 7.34 (H) 06/02/2014   TSH 3.30 02/24/2014   TSH 8.35 (H) 11/24/2013   TSH 7.73 (H) 05/20/2013   FREET4 1.33 09/06/2017   FREET4 0.90 10/08/2016   FREET4 0.89 03/20/2016   FREET4 0.97 01/23/2016   FREET4 0.76 07/28/2015   FREET4 0.78 06/02/2014   FREET4 1.18 02/24/2014   FREET4 0.61 11/24/2013   FREET4  0.65 05/20/2013    Patient does not have any complaints other than hair loss.  She also has a history of osteoporosis -on alendronate when she remembers, HL - on Zocor.  ROS: Constitutional: no weight gain/no weight loss, no fatigue, no subjective hyperthermia, no subjective hypothermia Eyes: no blurry vision, no xerophthalmia ENT: no sore throat, + see HPI Cardiovascular: no CP/no SOB/no palpitations/no leg swelling Respiratory: no cough/no SOB/no wheezing Gastrointestinal: no N/no V/no D/no C/no acid reflux Musculoskeletal: no muscle aches/no joint aches Skin: + rash - fingertips - dorsal aspect, + hair loss Neurological: no tremors/no numbness/no tingling/no dizziness  I reviewed pt's medications, allergies, PMH, social hx, family hx, and changes were documented in the history of present illness. Otherwise, unchanged from my initial visit note.  Past Medical History:  Diagnosis Date  . GERD (gastroesophageal reflux disease)   . Hyperlipidemia   . Thyroid disease    No past surgical history on file. History   Social History  . Marital Status: Married    Spouse Name: N/A    Number of Children: 1   Occupational History  . retail.   Social History Main Topics  . Smoking status: Former Smoker    Types: Cigarettes    Quit date: 01/18/1989  . Smokeless tobacco: Never Used  . Alcohol Use: 4.2 oz/week  7 Glasses of wine per week  . Drug Use: No  . Sexual Activity: Yes    Partners: Male   Social History Narrative   Lives in [place] with husband. 40YO son, lives in [place], Ship broker. Dog in home. Born in [place], raised in [place]. [place]. Previously lived in [place].      Diet - regular diet      Exercise - walking   Caffeine: coffee in the morning; cup of hot tea      Hobbies - yardwork      Work - [employer] in [place]   Current Outpatient Medications on File Prior to Visit  Medication Sig Dispense Refill  . alendronate (FOSAMAX) 70 MG tablet TAKE 1 TABLET BY  MOUTH EVERY SEVEN DAYS WITH A FULL GLASS OF WATER ON AN EMPTY STOMACH 4 tablet 2  . b complex vitamins capsule Take by mouth.    Marland Kitchen BIOTIN PO Take by mouth.    . Calcium Carb-Cholecalciferol (CALCIUM-VITAMIN D) 500-200 MG-UNIT tablet Take 1 tablet by mouth daily.    Marland Kitchen levothyroxine (SYNTHROID, LEVOTHROID) 75 MCG tablet TAKE ONE TABLET BY MOUTH DAILY 30 tablet 1  . simvastatin (ZOCOR) 20 MG tablet Take 1 tablet (20 mg total) by mouth every evening. 90 tablet 3   No current facility-administered medications on file prior to visit.    No Known Allergies Family History  Problem Relation Age of Onset  . Breast cancer Mother 27  . Heart disease Father   . Cancer Father        Prostate  . Diabetes Father        diagnosed later in life  . Prostate cancer Father   . Asthma Brother    PE: BP 122/74 (BP Location: Right Arm, Patient Position: Sitting, Cuff Size: Normal)   Pulse 96   Ht 5\' 5"  (1.651 m)   Wt 143 lb 9.6 oz (65.1 kg)   SpO2 98%   BMI 23.90 kg/m  Wt Readings from Last 3 Encounters:  10/03/17 143 lb 9.6 oz (65.1 kg)  09/10/17 140 lb 12.8 oz (63.9 kg)  10/03/16 140 lb (63.5 kg)   Constitutional: Normal weight, in NAD Eyes: PERRLA, EOMI, no exophthalmos ENT: moist mucous membranes, thyroid fullness bilaterally, no cervical lymphadenopathy Cardiovascular: tachycardia, RR, No MRG Respiratory: CTA B Gastrointestinal: abdomen soft, NT, ND, BS+ Musculoskeletal: no deformities, strength intact in all 4 Skin: moist, warm, + periungual rash Neurological: no tremor with outstretched hands, DTR normal in all 4  ASSESSMENT: 1. MNG - Thyroid U/S (05/22/2013): heterogeneous gland, multinodular goiter - largest nodule in left lobe: 2.5 x 1.4 x 2.4 cm, solid, slightly echogenic, moderate internal vascularity, no calcifications.  - largest nodule on the right: 1.4 x 0.9 x 1.0 cm, predominantly  isoechoic, with mild internal vascularity, no calcifications.   - FNA both nodules  (06/12/2013): Adequacy Reason Satisfactory For Evaluation. Diagnosis THYROID, FINE NEEDLE ASPIRATION LEFT, (SPECIMEN 1 OF 2, COLLECTED ON 06/11/2013). 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 Claudette Laws MD Pathologist, Electronic Signature (Case signed 06/12/2013) Specimen Clinical Information Nontoxic uninodular goiter, nodule, 2.5 x 1.4 x 2.4cm dominant left nodule   Adequacy Reason Satisfactory For Evaluation. Diagnosis THYROID, FINE NEEDLE ASPIRATION RIGHT, (SPECIMEN 2 OF 2, COLLECTED ON 11/20 2014). BENIGN. FINDINGS CONSISTENT WITH GOITER. FINDINGS CONSISTENT WITH THE CONTENTS OF A CYST. Claudette Laws MD Pathologist, Electronic Signature (Case signed 06/12/2013) Specimen Clinical Information Nontoxic uninodular goiter, Nodule, 1.4 x 0.9 x 1.0cm domnant right lobe thyroid  nodule  The first nodule was characterized as consistent with a follicular neoplasm and/or lesion. This is not an entity described in the Bethesda criteria. I was not sure how to interpret this. The treatment plan for a follicular lesion would be to repeat FNA, while for follicular neoplasm would be surgical lobectomy.  I discussed with the pathologist >> he suggested that we interpret this as FLUS.  Thyroid U/S (02/21/2015):  Right thyroid lobe Measurements: 5.0 cm x 1.5 cm x 1.8 cm. Heterogeneous thyroid with relatively increased flow.  - Superior nodule measures 7 mm x 6 mm x 8 mm. - Previously biopsied lower right thyroid nodule measures 1.2 cm x 1.0 cm x 1.3 cm. Nodule is solid with internal reflectors.  Left thyroid lobe Measurements: 6.3 cm x 2.0 cm x 1.9 cm.  Heterogeneous left thyroid with relatively increased flow. - Nodule at the inferior aspect has been previously biopsied and measures 2.9 cm x 1.8 cm x 2.8 cm.  Isthmus Thickness: 3 mm. No nodules visualized.  Lymphadenopathy: None visualized.  Left thyroid nodule has increased in size. Due to previous history of  follicular lesion of undetermined significance, I suggested repeat biopsy with [facility] molecular testing.     03/03/2015: FNA: Adequacy Reason Satisfactory For Evaluation. 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 COMMENT: THE SPECIMEN IS HYPERCELLULAR AND CONSISTS OF SMALL TO LARGE GROUPS OF FOLLICULAR EPITHELIAL CELLS WITH FOCAL HURTHLE CELL CHANGES. THERE IS MILD CYTOLOGIC ATYPIA, INCLUDING INTRANUCLEAR GROOVES. BASED ON THESE FEATURES, A FOLLICULAR LESION/NEOPLASM CAN NOT BE ENTIRELY RULED OUT. Specimen Clinical Information Nodule at the inferior aspect has been previously biopsied and measures 2.9 cm x 1.8 cm x 2.8 cm Source Thyroid, Fine Needle Aspiration, Left Lobe, (Specimen 1 of 1, collected on 03/02/15 )  Afirma test results are benign.  2. Hypothyroidism  PLAN: 1. MNG and FLUS - No neck compression symptoms - I reviewed the report of her previous ultrasounds along with the patient: 1. The 1.4 cm nodule is a cyst, so the risk of cancer is almost 0, also considering the results of the FNA obtained in 05/2013  2. The 2.5 cm nodule is solid, without calcifications, with mild to moderate internal blood flow, more wide than tall.  This nodule was biopsied and this returned inconclusive (FLUS) x2, however, and AFIRMA test returned benign, which is associated with a risk of cancer of less than 1-2%.  - Therefore, we can continue to follow her on a yearly basis, clinically, and plan to check another ultrasound in 5 years from the previous (2021) - I will see her back in a year but I did advise her to let me know if she starts feeling any problems swallowing, any choking, coughing, or if she feels any nodules in the lower neck  2. Hypothyroidism - latest thyroid labs reviewed with pt >> normal, after we discussed about how  to take the levothyroxine correctly.  Last set of TFTs normal last month, however, she was on Biotin then (she believes she takes 5000 mcg daily   -will check at home). I advised her to stop this for 1 week, then come back for labs. - she continues on LT4 75 mcg daily - pt feels good on this dose, but still has hair loss >> if the TSH is closer to the ULN at next check >> will increase the dose to 88  mcg daily. - we discussed about taking the thyroid hormone every day, with water, >30 minutes before breakfast, separated by >4 hours from acid reflux medications, calcium, iron, multivitamins. Pt. is taking it correctly.  Component     Latest Ref Rng & Units 10/09/2017  TSH     0.35 - 4.50 uIU/mL 6.51 (H)  T4,Free(Direct)     0.60 - 1.60 ng/dL 0.84  TSH high >> will increase her levothyroxine dose to 88 mcg daily.  We will need to repeat her TFTs in 1.5 months, off biotin.  Philemon Kingdom, MD PhD [facility]

## 2017-10-09 ENCOUNTER — Other Ambulatory Visit (INDEPENDENT_AMBULATORY_CARE_PROVIDER_SITE_OTHER): Payer: 59

## 2017-10-09 ENCOUNTER — Ambulatory Visit: Payer: 59

## 2017-10-09 ENCOUNTER — Encounter: Payer: Self-pay | Admitting: Family Medicine

## 2017-10-09 DIAGNOSIS — E039 Hypothyroidism, unspecified: Secondary | ICD-10-CM

## 2017-10-09 DIAGNOSIS — E785 Hyperlipidemia, unspecified: Secondary | ICD-10-CM

## 2017-10-09 LAB — TSH: TSH: 6.51 u[IU]/mL — ABNORMAL HIGH (ref 0.35–4.50)

## 2017-10-09 LAB — T4, FREE: Free T4: 0.84 ng/dL (ref 0.60–1.60)

## 2017-10-09 LAB — LDL CHOLESTEROL, DIRECT: Direct LDL: 97 mg/dL

## 2017-10-10 MED ORDER — LEVOTHYROXINE SODIUM 88 MCG PO TABS: 88.0000 ug | ORAL_TABLET | Freq: Every day | ORAL | 5 refills | Status: DC

## 2017-10-10 NOTE — Addendum Note (Signed)
Addended by: Philemon Kingdom on: 10/10/2017 11:45 AM   Modules accepted: Orders

## 2017-10-14 ENCOUNTER — Telehealth: Payer: Self-pay | Admitting: Radiology

## 2017-10-14 NOTE — Telephone Encounter (Signed)
It does not appear that she needs any labs at this point.  She just had lab work last week through our office.  Does not appear that she needs the endocrinologist labs yet either.

## 2017-10-14 NOTE — Telephone Encounter (Signed)
Pt coming in for labs tomorrow, only labs are for another provider. Would you like any labs drawn on pt? If, so please place future orders. Thank you

## 2017-10-15 ENCOUNTER — Other Ambulatory Visit: Payer: 59

## 2017-10-15 NOTE — Telephone Encounter (Signed)
Called pt & rescheduled lab appt. Pt stated that she is supposed to have thyroid labs rechecked in 6 weeks for Dr. Cruzita Lederer.

## 2017-11-03 ENCOUNTER — Other Ambulatory Visit: Payer: Self-pay | Admitting: Internal Medicine

## 2017-11-06 ENCOUNTER — Encounter: Payer: Self-pay | Admitting: Internal Medicine

## 2017-11-07 ENCOUNTER — Ambulatory Visit: Payer: 59

## 2017-11-07 NOTE — Telephone Encounter (Signed)
C, look at the last thyroid lab note:  Viewed by Ancil Linsey on 10/11/2017 7:42 AM  Written by Philemon Kingdom, MD on 10/10/2017 11:46 AM  Dear Ms Shannon [name],  Your TSH is too high >> let's increase the levothyroxine dose to 88 mcg daily. We will need to repeat your thyroid tests in 1.5 months, off biotin for a week, as discussed.  Please call our main office number [phone]) to schedule a lab appointment.  Sincerely,  Philemon Kingdom MD   Let's please call the pharmacy and clarify. Ty! C

## 2017-11-11 ENCOUNTER — Telehealth: Payer: Self-pay | Admitting: Internal Medicine

## 2017-11-11 NOTE — Telephone Encounter (Signed)
Caller states she was asked to take more levothyroxine Rx after her RX was filled, so has, she has run into a refill to soon issue. She will be out Monday morning.  She is expecting 88 mcg per day the one she has on file is for 75 mcg which is the old rx . [phone]

## 2017-11-11 NOTE — Telephone Encounter (Signed)
Spoke to pharmacy and clarified which dosage pt should be on and they agreed to fill the 88 mcg dosage, also called and left vm informing pt

## 2017-11-26 ENCOUNTER — Other Ambulatory Visit: Payer: 59

## 2018-01-03 ENCOUNTER — Other Ambulatory Visit (INDEPENDENT_AMBULATORY_CARE_PROVIDER_SITE_OTHER): Payer: Managed Care, Other (non HMO)

## 2018-01-03 DIAGNOSIS — E039 Hypothyroidism, unspecified: Secondary | ICD-10-CM | POA: Diagnosis not present

## 2018-01-03 LAB — TSH: TSH: 3.89 u[IU]/mL (ref 0.35–4.50)

## 2018-01-03 LAB — T4, FREE: Free T4: 1.01 ng/dL (ref 0.60–1.60)

## 2018-02-13 ENCOUNTER — Other Ambulatory Visit: Payer: Self-pay | Admitting: Family Medicine

## 2018-04-12 ENCOUNTER — Encounter: Payer: Self-pay | Admitting: Family Medicine

## 2018-05-12 ENCOUNTER — Other Ambulatory Visit: Payer: Self-pay | Admitting: Family Medicine

## 2018-05-16 ENCOUNTER — Other Ambulatory Visit: Payer: Self-pay | Admitting: Internal Medicine

## 2018-06-04 ENCOUNTER — Encounter: Payer: Self-pay | Admitting: Family Medicine

## 2018-06-06 ENCOUNTER — Encounter: Payer: Self-pay | Admitting: Family Medicine

## 2018-06-06 ENCOUNTER — Other Ambulatory Visit: Payer: Self-pay | Admitting: Family Medicine

## 2018-06-11 ENCOUNTER — Other Ambulatory Visit: Payer: Self-pay | Admitting: Internal Medicine

## 2018-07-09 ENCOUNTER — Other Ambulatory Visit: Payer: Self-pay | Admitting: Internal Medicine

## 2018-07-15 DIAGNOSIS — J019 Acute sinusitis, unspecified: Secondary | ICD-10-CM | POA: Diagnosis not present

## 2018-08-03 ENCOUNTER — Other Ambulatory Visit: Payer: Self-pay | Admitting: Family Medicine

## 2018-08-05 ENCOUNTER — Other Ambulatory Visit: Payer: Self-pay | Admitting: Internal Medicine

## 2018-09-01 ENCOUNTER — Other Ambulatory Visit: Payer: Self-pay | Admitting: Family Medicine

## 2018-09-01 NOTE — Telephone Encounter (Signed)
Last OV 09/10/2017  Last refilled 08/04/2018 disp 4 with no refills   Sent to PCP for approval

## 2018-09-03 ENCOUNTER — Other Ambulatory Visit: Payer: Self-pay | Admitting: Internal Medicine

## 2018-09-11 ENCOUNTER — Encounter: Payer: 59 | Admitting: Family Medicine

## 2018-09-12 ENCOUNTER — Encounter: Payer: Self-pay | Admitting: Family Medicine

## 2018-09-15 ENCOUNTER — Encounter: Payer: Self-pay | Admitting: Family Medicine

## 2018-09-15 DIAGNOSIS — E039 Hypothyroidism, unspecified: Secondary | ICD-10-CM

## 2018-09-15 DIAGNOSIS — M810 Age-related osteoporosis without current pathological fracture: Secondary | ICD-10-CM

## 2018-09-15 DIAGNOSIS — E785 Hyperlipidemia, unspecified: Secondary | ICD-10-CM

## 2018-09-16 NOTE — Telephone Encounter (Signed)
Please see if there is a 4:30 time slot on a Monday or Wednesday that she can in for an appointment. Thanks.

## 2018-09-18 NOTE — Telephone Encounter (Signed)
Pt is requesting to have lab work prior to appt so she is able to review results at the appt 10/10/2018. Please call to schedule if approved/orders placed. [phone].

## 2018-09-18 NOTE — Telephone Encounter (Signed)
Pt called to check on appt, found open slot 10/10/2018 10:30am that pt accepted for cpe with Dr. Caryl Bis

## 2018-09-18 NOTE — Telephone Encounter (Signed)
appt scheduled for labs with the patient.  Gae Bon, CMA

## 2018-09-19 NOTE — Telephone Encounter (Signed)
Patient  Has an appointment 10/10/18?

## 2018-09-21 NOTE — Telephone Encounter (Signed)
Orders placed for lab work. Please get her scheduled for labs. 

## 2018-09-22 ENCOUNTER — Other Ambulatory Visit (INDEPENDENT_AMBULATORY_CARE_PROVIDER_SITE_OTHER): Payer: 59

## 2018-09-22 DIAGNOSIS — E785 Hyperlipidemia, unspecified: Secondary | ICD-10-CM | POA: Diagnosis not present

## 2018-09-22 DIAGNOSIS — E039 Hypothyroidism, unspecified: Secondary | ICD-10-CM

## 2018-09-22 DIAGNOSIS — M810 Age-related osteoporosis without current pathological fracture: Secondary | ICD-10-CM | POA: Diagnosis not present

## 2018-09-22 LAB — LIPID PANEL
CHOL/HDL RATIO: 2
Cholesterol: 156 mg/dL (ref 0–200)
HDL: 67.3 mg/dL (ref >39.00)
LDL CALC: 58 mg/dL (ref 0–99)
NonHDL: 89.11
Triglycerides: 158 mg/dL — ABNORMAL HIGH (ref 0.0–149.0)
VLDL: 31.6 mg/dL (ref 0.0–40.0)

## 2018-09-22 LAB — COMPREHENSIVE METABOLIC PANEL
ALT: 41 U/L — ABNORMAL HIGH (ref 0–35)
AST: 41 U/L — AB (ref 0–37)
Albumin: 4.5 g/dL (ref 3.5–5.2)
Alkaline Phosphatase: 37 U/L — ABNORMAL LOW (ref 39–117)
BUN: 19 mg/dL (ref 6–23)
CALCIUM: 9.4 mg/dL (ref 8.4–10.5)
CO2: 26 mEq/L (ref 19–32)
CREATININE: 0.79 mg/dL (ref 0.40–1.20)
Chloride: 102 mEq/L (ref 96–112)
GFR: 72.46 mL/min (ref >60.00)
Glucose, Bld: 77 mg/dL (ref 70–99)
Potassium: 4.2 mEq/L (ref 3.5–5.1)
Sodium: 137 mEq/L (ref 135–145)
Total Bilirubin: 1 mg/dL (ref 0.2–1.2)
Total Protein: 7.5 g/dL (ref 6.0–8.3)

## 2018-09-22 LAB — T4, FREE: Free T4: 0.94 ng/dL (ref 0.60–1.60)

## 2018-09-22 LAB — TSH: TSH: 2.48 u[IU]/mL (ref 0.35–4.50)

## 2018-09-22 LAB — VITAMIN D 25 HYDROXY (VIT D DEFICIENCY, FRACTURES): VITD: 69.78 ng/mL (ref 30.00–100.00)

## 2018-09-24 ENCOUNTER — Other Ambulatory Visit: Payer: Self-pay | Admitting: Family

## 2018-09-24 ENCOUNTER — Other Ambulatory Visit: Payer: Self-pay

## 2018-09-24 ENCOUNTER — Encounter: Payer: Self-pay | Admitting: Family Medicine

## 2018-09-24 DIAGNOSIS — M67442 Ganglion, left hand: Secondary | ICD-10-CM | POA: Diagnosis not present

## 2018-09-24 DIAGNOSIS — M67449 Ganglion, unspecified hand: Secondary | ICD-10-CM | POA: Insufficient documentation

## 2018-09-24 DIAGNOSIS — R748 Abnormal levels of other serum enzymes: Secondary | ICD-10-CM

## 2018-09-30 ENCOUNTER — Other Ambulatory Visit: Payer: 59

## 2018-10-02 ENCOUNTER — Other Ambulatory Visit (INDEPENDENT_AMBULATORY_CARE_PROVIDER_SITE_OTHER): Payer: 59

## 2018-10-02 ENCOUNTER — Other Ambulatory Visit: Payer: Self-pay

## 2018-10-02 DIAGNOSIS — R748 Abnormal levels of other serum enzymes: Secondary | ICD-10-CM | POA: Diagnosis not present

## 2018-10-02 LAB — HEPATIC FUNCTION PANEL
ALT: 29 U/L (ref 0–35)
AST: 36 U/L (ref 0–37)
Albumin: 4.8 g/dL (ref 3.5–5.2)
Alkaline Phosphatase: 41 U/L (ref 39–117)
Bilirubin, Direct: 0.1 mg/dL (ref 0.0–0.3)
TOTAL PROTEIN: 7.4 g/dL (ref 6.0–8.3)
Total Bilirubin: 0.7 mg/dL (ref 0.2–1.2)

## 2018-10-03 ENCOUNTER — Other Ambulatory Visit: Payer: Self-pay

## 2018-10-03 ENCOUNTER — Ambulatory Visit: Payer: 59 | Admitting: Internal Medicine

## 2018-10-03 ENCOUNTER — Encounter: Payer: Self-pay | Admitting: Internal Medicine

## 2018-10-03 VITALS — BP 110/70 | HR 88 | Ht 66.0 in | Wt 137.0 lb

## 2018-10-03 DIAGNOSIS — E041 Nontoxic single thyroid nodule: Secondary | ICD-10-CM

## 2018-10-03 DIAGNOSIS — E039 Hypothyroidism, unspecified: Secondary | ICD-10-CM | POA: Diagnosis not present

## 2018-10-03 DIAGNOSIS — L659 Nonscarring hair loss, unspecified: Secondary | ICD-10-CM

## 2018-10-03 MED ORDER — LEVOTHYROXINE SODIUM 88 MCG PO TABS: 88.0000 ug | ORAL_TABLET | Freq: Every day | ORAL | 3 refills | Status: DC

## 2018-10-03 NOTE — Progress Notes (Signed)
Patient ID: Shannon [name], female   DOB: 1951/05/31, 68 y.o.   MRN: [id number]  HPI f/u for  Shannon [name] is a 68 y.o.-year-old female, returning for f/u for MNG and hypothyroidism. Last visit 1 year ago.  Reviewed history: Pt's PCP felt a right sided nodule at [facility] in 2014 >> Thyroid U/S (05/22/2013): heterogeneous gland, multinodular goiter with  - largest nodule in left lobe, 2.5 x 1.4 x 2.4 cm, solid, slightly echogenic, moderate internal vascularity, no calcifications.  - Largest nodule on the right: 1.4 x 0.9 x 1.0 cm, predominantly  isoechoic, with mild internal vascularity. No calcifications.   We biopsied both nodules (05/2013): The 2.5 cm nodule returned as FLUS, while the 1.4 cm nodule returned benign.  2016: A repeat biopsy of the left thyroid nodule returned again as FLUS. We perform molecular analysis with the [facility] test and this was negative for malignancy.  Hypothyroidism  - dx ~2003 >> started on levoxyl then >> but stopped subsequently as she did not feel different. She restarted LT4, on 25 mcg daily >>  now on 75 mcg since 07/2015.  She was initially not taking this correctly, but now takes it as advised.  Pt is on levothyroxine 88 Mcg daily (dose increased 09/2017), taken: - in am - fasting - at least 30 min from b'fast - no Fe, PPIs - + Ca 600, MVI after lunch - + Biotin (120 mcg), Super B complex (? Biotin)  Reviewed patient's TFTs: Lab Results  Component Value Date   TSH 2.48 09/22/2018   TSH 3.89 01/03/2018   TSH 6.51 (H) 10/09/2017   TSH 3.22 09/06/2017   TSH 2.71 10/08/2016   TSH 2.98 03/20/2016   TSH 6.15 (H) 01/23/2016   TSH 5.97 (H) 07/28/2015   TSH 5.45 (H) 02/22/2015   TSH 7.34 (H) 06/02/2014   FREET4 0.94 09/22/2018   FREET4 1.01 01/03/2018   FREET4 0.84 10/09/2017   FREET4 1.33 09/06/2017   FREET4 0.90 10/08/2016   FREET4 0.89 03/20/2016   FREET4 0.97 01/23/2016   FREET4 0.76 07/28/2015   FREET4 0.78 06/02/2014   FREET4 1.18 02/24/2014     She also has a history of osteoporosis -on alendronate when she remembers, HL - on Zocor.  + Viviscal (Biotin, calcium- low doses) and B complex for Hair loss. On Nioxin shampoo.  Lab Results  Component Value Date   VD25OH 69.78 09/22/2018   VD25OH 35.11 09/06/2017   VD25OH 39.36 06/20/2016   VD25OH 39 11/24/2013   VD25OH 51 05/20/2013   On vit D (400 IU) OTC.  ROS: Constitutional: no weight gain/no weight loss, no fatigue, no subjective hyperthermia, no subjective hypothermia Eyes: no blurry vision, no xerophthalmia ENT: no sore throat, no nodules palpated in neck, no dysphagia, no odynophagia, no hoarseness Cardiovascular: no CP/no SOB/no palpitations/no leg swelling Respiratory: no cough/no SOB/no wheezing Gastrointestinal: no N/no V/no D/no C/no acid reflux Musculoskeletal: no muscle aches/no joint aches Skin: no rashes, + hair loss Neurological: no tremors/no numbness/no tingling/no dizziness  I reviewed pt's medications, allergies, PMH, social hx, family hx, and changes were documented in the history of present illness. Otherwise, unchanged from my initial visit note.  Past Medical History:  Diagnosis Date  . GERD (gastroesophageal reflux disease)   . Hyperlipidemia   . Thyroid disease    No past surgical history on file. History   Social History  . Marital Status: Married    Spouse Name: N/A    Number of Children: 1  Occupational History  . retail.   Social History Main Topics  . Smoking status: Former Smoker    Types: Cigarettes    Quit date: 01/18/1989  . Smokeless tobacco: Never Used  . Alcohol Use: 4.2 oz/week    7 Glasses of wine per week  . Drug Use: No  . Sexual Activity: Yes    Partners: Male   Social History Narrative   Lives in [place] with husband. 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 Dog in home. Born in [place], raised in [place]. [place]. Previously lived in [place].      Diet - regular diet      Exercise - walking   Caffeine:  coffee in the morning; cup of hot tea      Hobbies - yardwork      Work - [employer] in [place]   Current Outpatient Medications on File Prior to Visit  Medication Sig Dispense Refill  . alendronate (FOSAMAX) 70 MG tablet TAKE ONE TABLET BY MOUTH EVERY 7 DAYS WITH A FULL GLASS OF WATER ON AN EMPTY STOMACH 4 tablet 0  . b complex vitamins capsule Take by mouth.    Marland Kitchen BIOTIN PO Take by mouth.    . Calcium Carb-Cholecalciferol (CALCIUM-VITAMIN D) 500-200 MG-UNIT tablet Take 1 tablet by mouth daily.    Marland Kitchen levothyroxine (SYNTHROID, LEVOTHROID) 88 MCG tablet TAKE ONE TABLET BY MOUTH DAILY 30 tablet 0  . Multiple Vitamins-Minerals (CENTRUM SILVER PO) Take 1 tablet by mouth daily.    . simvastatin (ZOCOR) 20 MG tablet TAKE 1 TABLET (20 MG TOTAL) BY MOUTH EVERY EVENING. 90 tablet 1  . triamcinolone (KENALOG) 0.025 % ointment      No current facility-administered medications on file prior to visit.    No Known Allergies Family History  Problem Relation Age of Onset  . Breast cancer Mother 3  . Heart disease Father   . Cancer Father        Prostate  . Diabetes Father        diagnosed later in life  . Prostate cancer Father   . Asthma Brother    PE: BP 110/70   Pulse 88   Ht 5\' 6"  (1.676 m)   Wt 137 lb (62.1 kg)   SpO2 97%   BMI 22.11 kg/m  Wt Readings from Last 3 Encounters:  10/03/18 137 lb (62.1 kg)  10/03/17 143 lb 9.6 oz (65.1 kg)  09/10/17 140 lb 12.8 oz (63.9 kg)   Constitutional: Normal weight, in NAD Eyes: PERRLA, EOMI, no exophthalmos ENT: moist mucous membranes, no thyromegaly, no cervical lymphadenopathy Cardiovascular: RRR, No MRG Respiratory: CTA B Gastrointestinal: abdomen soft, NT, ND, BS+ Musculoskeletal: no deformities, strength intact in all 4 Skin: moist, warm, no rashes Neurological: no tremor with outstretched hands, DTR normal in all 4  ASSESSMENT: 1. MNG - Thyroid U/S (05/22/2013): heterogeneous gland, multinodular goiter - largest nodule in left  lobe: 2.5 x 1.4 x 2.4 cm, solid, slightly echogenic, moderate internal vascularity, no calcifications.  - largest nodule on the right: 1.4 x 0.9 x 1.0 cm, predominantly  isoechoic, with mild internal vascularity, no calcifications.   - FNA both nodules (06/12/2013): Adequacy Reason Satisfactory For Evaluation. Diagnosis THYROID, FINE NEEDLE ASPIRATION LEFT, (SPECIMEN 1 OF 2, COLLECTED ON 06/11/2013). 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 Claudette Laws MD Pathologist, Electronic Signature (Case signed 06/12/2013) Specimen Clinical Information Nontoxic uninodular goiter, nodule, 2.5 x 1.4 x 2.4cm dominant left nodule   Adequacy Reason Satisfactory For  Evaluation. Diagnosis THYROID, FINE NEEDLE ASPIRATION RIGHT, (SPECIMEN 2 OF 2, COLLECTED ON 11/20 2014). BENIGN. FINDINGS CONSISTENT WITH GOITER. FINDINGS CONSISTENT WITH THE CONTENTS OF A CYST. Claudette Laws MD Pathologist, Electronic Signature (Case signed 06/12/2013) Specimen Clinical Information Nontoxic uninodular goiter, Nodule, 1.4 x 0.9 x 1.0cm domnant right lobe thyroid nodule  The first nodule was characterized as consistent with a follicular neoplasm and/or lesion. This was not an entity described in the Bethesda criteria. I was not sure how to interpret this. The treatment plan for a follicular lesion would be to repeat FNA, while for follicular neoplasm would be surgical lobectomy.  I discussed with the pathologist >> he suggested that we interpret this as FLUS.  Thyroid U/S (02/21/2015):  Right thyroid lobe Measurements: 5.0 cm x 1.5 cm x 1.8 cm. Heterogeneous thyroid with relatively increased flow.  - Superior nodule measures 7 mm x 6 mm x 8 mm. - Previously biopsied lower right thyroid nodule measures 1.2 cm x 1.0 cm x 1.3 cm. Nodule is solid with internal reflectors.  Left thyroid lobe Measurements: 6.3 cm x 2.0 cm x 1.9 cm.  Heterogeneous left thyroid with relatively increased flow. - Nodule  at the inferior aspect has been previously biopsied and measures 2.9 cm x 1.8 cm x 2.8 cm.  Isthmus Thickness: 3 mm. No nodules visualized.  Lymphadenopathy: None visualized.  Left thyroid nodule has increased in size. Due to previous history of follicular lesion of undetermined significance, I suggested repeat biopsy with [facility] molecular testing.     03/03/2015: FNA: Adequacy Reason Satisfactory For Evaluation. 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 COMMENT: THE SPECIMEN IS HYPERCELLULAR AND CONSISTS OF SMALL TO LARGE GROUPS OF FOLLICULAR EPITHELIAL CELLS WITH FOCAL HURTHLE CELL CHANGES. THERE IS MILD CYTOLOGIC ATYPIA, INCLUDING INTRANUCLEAR GROOVES. BASED ON THESE FEATURES, A FOLLICULAR LESION/NEOPLASM CAN NOT BE ENTIRELY RULED OUT. Specimen Clinical Information Nodule at the inferior aspect has been previously biopsied and measures 2.9 cm x 1.8 cm x 2.8 cm Source Thyroid, Fine Needle Aspiration, Left Lobe, (Specimen 1 of 1, collected on 03/02/15 )  AFIRMA test results were benign.  2. Hypothyroidism  3. Hair loss  PLAN: 1.  Multinodular goiter -She denies neck compression symptoms -Reviewed the report of the previous ultrasound along with the patient  The 1.4 cm nodule is a cyst, so the risk of cancer is extremely low, also considering the results of her FNA obtained in 05/2013  The 2.5 cm nodule is solid, without calcifications, with mild to moderate internal blood flow, more wide than tall.  This nodule was biopsied and this returned inconclusive ([place]) x2, however, the [facility] molecular marker returned benign, which is associated with a risk of cancer less than 1 to 2%. -Therefore, we can continue to follow her on a yearly basis, clinically, and plan to check another ultrasound in 5 years from the  previous-next year -I will see her back in a year, but she knows that she needs to let me know if she develops dysphagia, choking, persistent cough, persistent hoarseness  2. Hypothyroidism - latest thyroid labs reviewed with pt >> normal earlier this month - she continues on LT4 88 Mcg daily, decreased after last visit. - pt feels good on this dose. - we discussed about taking the thyroid hormone every day, with water, >30 minutes before breakfast, separated by >4 hours from acid reflux medications,  calcium, iron, multivitamins. Pt. is taking it correctly.  3. Hair loss -Persistent despite normal TFTs.  She is on biotin low-dose + super B complex.  It is unclear how much biotin is in the B complex. -Hair loss, however, is not visible, but she does notice that is more prominent on the forehead. -She uses different shampoos but not helping much -We discussed about possibly seeing the hair loss clinic at [facility] or a dermatologist that specializes in hair loss at [facility].  She did see dermatology in the past.  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  She will be taken off Fosamax soon, now that she completed 5 years on the medication.  She will pay attention to see if her hair loss improves afterwards.  Philemon Kingdom, MD PhD [facility] End[facility]

## 2018-10-03 NOTE — Patient Instructions (Signed)
Please continue Levothyroxine 88 mcg daily.  Take the thyroid hormone every day, with water, at least 30 minutes before breakfast, separated by at least 4 hours from: - acid reflux medications - calcium - iron - multivitamins  Please come back for a follow-up appointment in 1 year.

## 2018-10-04 ENCOUNTER — Encounter: Payer: Self-pay | Admitting: Internal Medicine

## 2018-10-05 ENCOUNTER — Other Ambulatory Visit: Payer: Self-pay | Admitting: Family Medicine

## 2018-10-08 DIAGNOSIS — M67442 Ganglion, left hand: Secondary | ICD-10-CM | POA: Diagnosis not present

## 2018-10-10 ENCOUNTER — Encounter: Payer: Self-pay | Admitting: Family Medicine

## 2018-10-13 ENCOUNTER — Other Ambulatory Visit: Payer: Self-pay

## 2018-10-13 ENCOUNTER — Ambulatory Visit (INDEPENDENT_AMBULATORY_CARE_PROVIDER_SITE_OTHER): Payer: 59 | Admitting: Family Medicine

## 2018-10-13 ENCOUNTER — Encounter: Payer: Self-pay | Admitting: Family Medicine

## 2018-10-13 VITALS — BP 124/73 | HR 66 | Temp 97.9°F | Ht 66.0 in | Wt 138.8 lb

## 2018-10-13 DIAGNOSIS — Z0000 Encounter for general adult medical examination without abnormal findings: Secondary | ICD-10-CM

## 2018-10-13 DIAGNOSIS — Z1239 Encounter for other screening for malignant neoplasm of breast: Secondary | ICD-10-CM | POA: Diagnosis not present

## 2018-10-13 DIAGNOSIS — Z23 Encounter for immunization: Secondary | ICD-10-CM | POA: Diagnosis not present

## 2018-10-13 DIAGNOSIS — M810 Age-related osteoporosis without current pathological fracture: Secondary | ICD-10-CM | POA: Diagnosis not present

## 2018-10-13 NOTE — Assessment & Plan Note (Addendum)
Physical exam completed.  Encouraged continued activity and monitoring her diet.  Mammogram and DEXA scan ordered.  Patient to call and schedule these.  Pneumovax given today.  Discussed decreasing her alcohol use and discussed that this could be contributing to her intermittently elevated liver enzymes.  Discussed other potential causes of her prior elevation of liver enzymes.  Repeat was back to normal.

## 2018-10-13 NOTE — Addendum Note (Signed)
Addended by: Myriam Forehand on: 10/13/2018 10:46 AM   Modules accepted: Orders

## 2018-10-13 NOTE — Progress Notes (Signed)
Shannon Rumps, MD Phone: [phone]  Shannon [name] is a 68 y.o. female who presents today for CPE.  Exercises by walking on the treadmill daily. Eating more produce and tracking her calories. Mammogram is due.  Last one was 09/30/2017 and was negative. Colonoscopy 11/08/2014 with 5-year recall. Family history of breast cancer in her mother at age 17.  No family history of ovarian or colon cancer. Tdap up-to-date.  Flu vaccine up-to-date.  She is due for Pneumovax.  Shingrix up-to-date. DEXA scan is due.  She is taking calcium and vitamin D. Hepatitis C screening completed. Tobacco use and illicit drug use is denied.  She notes 2 glasses of wine nightly.  No other alcohol intake. Sees a dentist 3 times yearly and ophthalmologist once yearly. She does note some stress related to her parents being in their mid 74s and still living alone.  Her son was in a tornado in [place] and is also in [employer] business and is not able to work at this time.  Active Ambulatory Problems    Diagnosis Date Noted  . Hyperlipidemia 05/20/2013  . Hypothyroidism 05/20/2013  . Osteoporosis 05/20/2013  . Thyroid nodule 05/20/2013  . Routine general medical examination at a health care facility 11/24/2013  . Hair loss 06/02/2014   Resolved Ambulatory Problems    Diagnosis Date Noted  . Need for prophylactic vaccination and inoculation against influenza 05/20/2013  . UTI (urinary tract infection) 11/09/2013  . Screening for breast cancer 11/24/2013  . Encounter for general adult medical examination with abnormal findings 09/10/2017   Past Medical History:  Diagnosis Date  . GERD (gastroesophageal reflux disease)   . Thyroid disease     Family History  Problem Relation Age of Onset  . Breast cancer Mother 64  . Heart disease Father   . Cancer Father        Prostate  . Diabetes Father        diagnosed later in life  . Prostate cancer Father   . Asthma Brother     Social History    Socioeconomic History  . Marital status: Married    Spouse name: Not on file  . Number of children: Not on file  . Years of education: Not on file  . Highest education level: Not on file  Occupational History  . Not on file  Social Needs  . Financial resource strain: Not on file  . Food insecurity:    Worry: Not on file    Inability: Not on file  . Transportation needs:    Medical: Not on file    Non-medical: Not on file  Tobacco Use  . Smoking status: Former Smoker    Types: Cigarettes    Last attempt to quit: 01/18/1989    Years since quitting: 29.7  . Smokeless tobacco: Never Used  Substance and Sexual Activity  . Alcohol use: Yes    Alcohol/week: 7.0 standard drinks    Types: 7 Glasses of wine per week  . Drug use: No  . Sexual activity: Yes    Partners: Male  Lifestyle  . Physical activity:    Days per week: Not on file    Minutes per session: Not on file  . Stress: Not on file  Relationships  . Social connections:    Talks on phone: Not on file    Gets together: Not on file    Attends religious service: Not on file    Active member of club or organization: Not on  file    Attends meetings of clubs or organizations: Not on file    Relationship status: Not on file  . Intimate partner violence:    Fear of current or ex partner: Not on file    Emotionally abused: Not on file    Physically abused: Not on file    Forced sexual activity: Not on file  Other Topics Concern  . Not on file  Social History Narrative   Lives in [place] with husband. 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 Dog in home. Born in [place], raised in [place]. [place]. Previously lived in [place].      Diet - regular diet      Exercise - walking   Caffeine: coffee in the morning; cup of hot tea      Hobbies - yardwork      Work - [employer] in [place]:  Negative for nexplained weight loss, fever Skin: Negative for new or changing mole, sore that won't heal HEENT:  Negative for trouble hearing, trouble seeing, ringing in ears, mouth sores, hoarseness, change in voice, dysphagia. CV:  Negative for chest pain, dyspnea, edema, palpitations Resp: Negative for cough, dyspnea, hemoptysis GI: Negative for nausea, vomiting, diarrhea, constipation, abdominal pain, melena, hematochezia. GU: Negative for dysuria, incontinence, urinary hesitance, hematuria, vaginal or penile discharge, polyuria, sexual difficulty, lumps in testicle or breasts MSK: Negative for muscle cramps or aches, joint pain or swelling Neuro: Negative for headaches, weakness, numbness, dizziness, passing out/fainting Psych: Negative for depression, anxiety, memory problems  Objective  Physical Exam Vitals:   10/13/18 0941  BP: 124/73  Pulse: 66  Temp: 97.9 F (36.6 C)  SpO2: 95%    BP Readings from Last 3 Encounters:  10/13/18 124/73  10/03/18 110/70  10/03/17 122/74   Wt Readings from Last 3 Encounters:  10/13/18 138 lb 12.8 oz (63 kg)  10/03/18 137 lb (62.1 kg)  10/03/17 143 lb 9.6 oz (65.1 kg)    Physical Exam Constitutional:      General: She is not in acute distress.    Appearance: She is not diaphoretic.  HENT:     Head: Normocephalic and atraumatic.     Mouth/Throat:     Mouth: Mucous membranes are moist.     Pharynx: Oropharynx is clear.  Eyes:     Conjunctiva/sclera: Conjunctivae normal.     Pupils: Pupils are equal, round, and reactive to light.  Cardiovascular:     Rate and Rhythm: Normal rate and regular rhythm.     Heart sounds: Normal heart sounds.  Pulmonary:     Effort: Pulmonary effort is normal.     Breath sounds: Normal breath sounds.  Abdominal:     General: Bowel sounds are normal. There is no distension.     Palpations: Abdomen is soft.     Tenderness: There is no abdominal tenderness. There is no guarding or rebound.  Genitourinary:    Comments: Chaperone used, bilateral breasts with no masses, tenderness, nipple inversion, or skin changes,  no axillary masses bilaterally Musculoskeletal:     Right lower leg: No edema.     Left lower leg: No edema.  Lymphadenopathy:     Cervical: No cervical adenopathy.  Skin:    General: Skin is warm and dry.  Neurological:     Mental Status: She is alert.  Psychiatric:        Mood and Affect: Mood normal.      Assessment/Plan:   Routine  general medical examination at a health care facility Physical exam completed.  Encouraged continued activity and monitoring her diet.  Mammogram and DEXA scan ordered.  Patient to call and schedule these.  Pneumovax given today.  Discussed decreasing her alcohol use and discussed that this could be contributing to her intermittently elevated liver enzymes.  Discussed other potential causes of her prior elevation of liver enzymes.  Repeat was back to normal.   Orders Placed This Encounter  Procedures  . MM 3D SCREEN BREAST BILATERAL    Standing Status:   Future    Standing Expiration Date:   12/13/2019    Order Specific Question:   Reason for Exam (SYMPTOM  OR DIAGNOSIS REQUIRED)    Answer:   breast cancer screening    Order Specific Question:   Preferred imaging location?    Answer:   [facility]  . DG Bone Density    Standing Status:   Future    Standing Expiration Date:   12/13/2019    Order Specific Question:   Reason for Exam (SYMPTOM  OR DIAGNOSIS REQUIRED)    Answer:   osteoporosis    Order Specific Question:   Preferred imaging location?    Answer:   [facility]    No orders of the defined types were placed in this encounter.    Shannon Rumps, MD [place]

## 2018-10-13 NOTE — Patient Instructions (Signed)
Nice to see you. We will get a bone density scan and mammogram.  Please contact the breast center to get these scheduled.  Please continue to stay active and monitor your diet.

## 2018-10-14 ENCOUNTER — Encounter: Payer: Self-pay | Admitting: Family Medicine

## 2018-10-15 ENCOUNTER — Telehealth: Payer: Self-pay

## 2018-10-15 ENCOUNTER — Ambulatory Visit: Payer: 59 | Admitting: Family Medicine

## 2018-10-15 ENCOUNTER — Encounter: Payer: Self-pay | Admitting: Family Medicine

## 2018-10-15 ENCOUNTER — Other Ambulatory Visit: Payer: Self-pay

## 2018-10-15 DIAGNOSIS — R21 Rash and other nonspecific skin eruption: Secondary | ICD-10-CM | POA: Insufficient documentation

## 2018-10-15 NOTE — Progress Notes (Signed)
  Tommi Rumps, MD Phone: [phone]  Shannon [name] is a 68 y.o. female who presents today for same-day visit.  Rash: Patient notes onset of left upper inner arm and anterior upper arm redness starting yesterday.  She sent a picture that is in the MyChart message from yesterday with fairly significant redness.  She notes it has been warm and tender to touch.  It does not extend to the area where she had her pneumonia vaccine.  She notes her dentist placed her on amoxicillin for a gingival issue.  She notes the rash has improved since she sent me the picture yesterday.  She notes no fevers.  Social History   Tobacco Use  Smoking Status Former Smoker  . Types: Cigarettes  . Last attempt to quit: 01/18/1989  . Years since quitting: 29.7  Smokeless Tobacco Never Used     ROS see history of present illness  Objective  Physical Exam Vitals:   10/15/18 1430  BP: 124/82  Pulse: 78  Temp: 98.5 F (36.9 C)  SpO2: 98%    BP Readings from Last 3 Encounters:  10/15/18 124/82  10/13/18 124/73  10/03/18 110/70   Wt Readings from Last 3 Encounters:  10/15/18 138 lb 3.2 oz (62.7 kg)  10/13/18 138 lb 12.8 oz (63 kg)  10/03/18 137 lb (62.1 kg)    Physical Exam Constitutional:      Appearance: Normal appearance.  Pulmonary:     Effort: Pulmonary effort is normal.  Musculoskeletal:       Arms:     Comments: Mild erythema in the area of the lines above, there is mild warmth, mild tenderness, no induration, this does not extend to the antecubital fossa, this does not extend up to her deltoid, there is no erythema or apparent tenderness in the area of her deltoid  Neurological:     Mental Status: She is alert.      Assessment/Plan: Please see individual problem list.  Erythematous rash This has been improving since last night.  There is the possibility that this could represent a cellulitis that is being treated by the amoxicillin she is currently on.  It does not appear  consistent with a drug rash.  Does not appear to be associated with the area that she got her pneumonia vaccine.  She will continue on amoxicillin at this time given that this has been improving.  She will send a picture with a MyChart message tomorrow and send an additional picture and my chart message on Friday.  We will then do a phone follow-up.  She is given return precautions.    No orders of the defined types were placed in this encounter.   No orders of the defined types were placed in this encounter.    Tommi Rumps, MD [place]

## 2018-10-15 NOTE — Telephone Encounter (Signed)
The patient has been scheduled

## 2018-10-15 NOTE — Assessment & Plan Note (Signed)
This has been improving since last night.  There is the possibility that this could represent a cellulitis that is being treated by the amoxicillin she is currently on.  It does not appear consistent with a drug rash.  Does not appear to be associated with the area that she got her pneumonia vaccine.  She will continue on amoxicillin at this time given that this has been improving.  She will send a picture with a MyChart message tomorrow and send an additional picture and my chart message on Friday.  We will then do a phone follow-up.  She is given return precautions.

## 2018-10-15 NOTE — Telephone Encounter (Signed)
Please call the patient.  It is not normal to have that kind of redness following a pneumonia vaccine.  Typically redness occurs around the injection site.  I would suggest that we see her in the office to check this area for possible skin infection.  You can offer her an appointment this afternoon.

## 2018-10-15 NOTE — Telephone Encounter (Signed)
Sent to PCP to advise 

## 2018-10-15 NOTE — Telephone Encounter (Signed)
Please schedule pt for an appt today at 4:30 PM pt is aware they have an appt.   Pt is being seen for a rash under arm.   Send back to me once done.

## 2018-10-15 NOTE — Patient Instructions (Signed)
Nice to see you. Please continue on the amoxicillin. This will provide coverage for possible cellulitis.  We will have a check in visit in 2 days. If this worsens please be evaluated. If you develop fever please be evaluated.

## 2018-10-16 ENCOUNTER — Encounter: Payer: Self-pay | Admitting: Family Medicine

## 2018-10-16 NOTE — Telephone Encounter (Signed)
Sent to PCP as an FYI  

## 2018-10-17 ENCOUNTER — Encounter: Payer: Self-pay | Admitting: Family Medicine

## 2018-11-03 ENCOUNTER — Other Ambulatory Visit: Payer: Self-pay

## 2018-11-03 MED ORDER — ALENDRONATE SODIUM 70 MG PO TABS: ORAL_TABLET | ORAL | 0 refills | Status: DC

## 2018-11-09 ENCOUNTER — Other Ambulatory Visit: Payer: Self-pay | Admitting: Family Medicine

## 2018-11-28 ENCOUNTER — Other Ambulatory Visit: Payer: Self-pay | Admitting: Family Medicine

## 2018-12-06 ENCOUNTER — Other Ambulatory Visit: Payer: Self-pay | Admitting: Family Medicine

## 2018-12-29 ENCOUNTER — Ambulatory Visit
Admission: RE | Admit: 2018-12-29 | Discharge: 2018-12-29 | Disposition: A | Discharge status: Home / Self Care | Payer: Medicare Other | Source: Ambulatory Visit | Attending: Family Medicine | Admitting: Family Medicine

## 2018-12-29 ENCOUNTER — Other Ambulatory Visit: Payer: Self-pay

## 2018-12-29 ENCOUNTER — Encounter: Payer: Self-pay | Admitting: Family Medicine

## 2018-12-29 DIAGNOSIS — M810 Age-related osteoporosis without current pathological fracture: Secondary | ICD-10-CM

## 2018-12-29 DIAGNOSIS — M8589 Other specified disorders of bone density and structure, multiple sites: Secondary | ICD-10-CM | POA: Diagnosis not present

## 2018-12-29 DIAGNOSIS — Z1239 Encounter for other screening for malignant neoplasm of breast: Secondary | ICD-10-CM

## 2018-12-29 DIAGNOSIS — Z1231 Encounter for screening mammogram for malignant neoplasm of breast: Secondary | ICD-10-CM | POA: Diagnosis present

## 2018-12-30 ENCOUNTER — Encounter: Payer: Self-pay | Admitting: *Deleted

## 2019-01-06 ENCOUNTER — Other Ambulatory Visit: Payer: Self-pay | Admitting: Family Medicine

## 2019-01-07 ENCOUNTER — Encounter: Payer: Self-pay | Admitting: Family Medicine

## 2019-02-03 ENCOUNTER — Other Ambulatory Visit: Payer: Self-pay

## 2019-02-03 ENCOUNTER — Encounter: Payer: Self-pay | Admitting: Podiatry

## 2019-02-03 ENCOUNTER — Ambulatory Visit: Payer: 59 | Admitting: Podiatry

## 2019-02-03 ENCOUNTER — Other Ambulatory Visit: Payer: Self-pay | Admitting: Podiatry

## 2019-02-03 ENCOUNTER — Ambulatory Visit (INDEPENDENT_AMBULATORY_CARE_PROVIDER_SITE_OTHER): Payer: Medicare Other

## 2019-02-03 VITALS — Temp 97.5°F

## 2019-02-03 DIAGNOSIS — M205X1 Other deformities of toe(s) (acquired), right foot: Secondary | ICD-10-CM

## 2019-02-03 DIAGNOSIS — M779 Enthesopathy, unspecified: Secondary | ICD-10-CM

## 2019-02-05 NOTE — Progress Notes (Signed)
   HPI: 68 y.o. female presenting today with a chief complaint of her right great toe moving towards her 2nd toe that began a few years ago. She states the symptoms are worsening but are not painful. She has not done anything for treatment and denies modifying factors. Patient is here for further evaluation and treatment.   Past Medical History:  Diagnosis Date  . GERD (gastroesophageal reflux disease)   . Hyperlipidemia   . Thyroid disease      Physical Exam: General: The patient is alert and oriented x3 in no acute distress.  Dermatology: Skin is warm, dry and supple bilateral lower extremities. Negative for open lesions or macerations.  Vascular: Palpable pedal pulses bilaterally. No edema or erythema noted. Capillary refill within normal limits.  Neurological: Epicritic and protective threshold grossly intact bilaterally.   Musculoskeletal Exam: Limited range of motion noted to the 1st MPJ of the right foot. All other pedal and ankle joints within normal limits bilateral. Muscle strength 5/5 in all groups bilateral.   Radiographic Exam:  Normal osseous mineralization. Joint spaces preserved. No fracture/dislocation/boney destruction.    Assessment: 1. Hallux limitus / DJD right - asymptomatic    Plan of Care:  1. Patient evaluated. X-Rays reviewed.  2. Silicone toe spacers provided.  3. Recommended good shoe gear.  4. Recommended surgery if it becomes symptomatic.  5. Return to clinic as needed.   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       Edrick Kins, DPM [facility]  Dr. Edrick Kins, DPM    [address], [place] [address]                Office [phone]  Fax [phone]

## 2019-02-08 ENCOUNTER — Other Ambulatory Visit: Payer: Self-pay | Admitting: Family Medicine

## 2019-02-10 MED ORDER — SIMVASTATIN 20 MG PO TABS: 20.0000 mg | ORAL_TABLET | Freq: Every evening | ORAL | 1 refills | Status: DC

## 2019-02-10 NOTE — Telephone Encounter (Signed)
Patient is requesting simvastatin 20 mg  Last fill date 01/08/2019 Last OV: 10/11/2018 Future OV: 10/14/2019

## 2019-03-10 ENCOUNTER — Encounter: Payer: Self-pay | Admitting: Family Medicine

## 2019-03-24 ENCOUNTER — Encounter: Payer: Self-pay | Admitting: Family Medicine

## 2019-03-26 ENCOUNTER — Other Ambulatory Visit: Payer: Self-pay

## 2019-03-26 DIAGNOSIS — Z20822 Contact with and (suspected) exposure to covid-19: Secondary | ICD-10-CM

## 2019-03-27 LAB — NOVEL CORONAVIRUS, NAA: SARS-CoV-2, NAA: NOT DETECTED

## 2019-08-15 ENCOUNTER — Other Ambulatory Visit: Payer: Self-pay | Admitting: Family Medicine

## 2019-08-15 ENCOUNTER — Encounter: Payer: Self-pay | Admitting: Family Medicine

## 2019-08-17 NOTE — Telephone Encounter (Signed)
Refilled til appointment 10/14/19

## 2019-08-26 DIAGNOSIS — L638 Other alopecia areata: Secondary | ICD-10-CM | POA: Diagnosis not present

## 2019-08-26 DIAGNOSIS — L218 Other seborrheic dermatitis: Secondary | ICD-10-CM | POA: Diagnosis not present

## 2019-08-26 DIAGNOSIS — Z7689 Persons encountering health services in other specified circumstances: Secondary | ICD-10-CM | POA: Diagnosis not present

## 2019-09-04 ENCOUNTER — Encounter: Payer: Self-pay | Admitting: Family Medicine

## 2019-09-14 ENCOUNTER — Other Ambulatory Visit: Payer: Self-pay | Admitting: Family Medicine

## 2019-09-20 ENCOUNTER — Encounter: Payer: Self-pay | Admitting: Family Medicine

## 2019-10-02 ENCOUNTER — Encounter: Payer: Self-pay | Admitting: Internal Medicine

## 2019-10-02 ENCOUNTER — Ambulatory Visit: Payer: PPO | Admitting: Internal Medicine

## 2019-10-02 ENCOUNTER — Other Ambulatory Visit: Payer: Self-pay

## 2019-10-02 VITALS — BP 118/60 | HR 74 | Ht 66.0 in | Wt 145.0 lb

## 2019-10-02 DIAGNOSIS — E041 Nontoxic single thyroid nodule: Secondary | ICD-10-CM | POA: Diagnosis not present

## 2019-10-02 DIAGNOSIS — E039 Hypothyroidism, unspecified: Secondary | ICD-10-CM | POA: Diagnosis not present

## 2019-10-02 LAB — TSH: TSH: 3.51 u[IU]/mL (ref 0.35–4.50)

## 2019-10-02 LAB — T4, FREE: Free T4: 1.15 ng/dL (ref 0.60–1.60)

## 2019-10-02 MED ORDER — LEVOTHYROXINE SODIUM 88 MCG PO TABS: 88.0000 ug | ORAL_TABLET | Freq: Every day | ORAL | 3 refills | Status: DC

## 2019-10-02 NOTE — Progress Notes (Addendum)
Patient ID: Shannon [name], female   DOB: 1951/01/25, 69 y.o.   MRN: [id number]  This visit occurred during the SARS-CoV-2 public health emergency.  Safety protocols were in place, including screening questions prior to the visit, additional usage of staff PPE, and extensive cleaning of exam room while observing appropriate contact time as indicated for disinfecting solutions.   HPI f/u for  Shannon [name] is a 69 y.o.-year-old female, returning for f/u for MNG and hypothyroidism. Last visit 1 year ago.  Reviewed history: Pt's PCP felt a right sided nodule at [facility] in 2014 >> Thyroid U/S (05/22/2013): heterogeneous gland, multinodular goiter with  - largest nodule in left lobe, 2.5 x 1.4 x 2.4 cm, solid, slightly echogenic, moderate internal vascularity, no calcifications.  - Largest nodule on the right: 1.4 x 0.9 x 1.0 cm, predominantly  isoechoic, with mild internal vascularity. No calcifications.   We biopsied both nodules (05/2013): The 2.5 cm nodule returned as FLUS, while the 1.4 cm nodule returned benign.  2016: A repeat biopsy of the left thyroid nodule returned again as FLUS. We perform molecular analysis with the [facility] test and this was negative for malignancy.  Hypothyroidism  - dx ~2003 >> started on levoxyl then >> but stopped subsequently as she did not feel different. She restarted LT4, on 25 mcg daily >>  now on 75 mcg since 07/2015.  She was initially not taking this correctly, but she now takes it as advised.  Pt is on levothyroxine 88 mcg daily, taken: - in am - fasting - at least 30-40 min from b'fast - no Fe, MVI, PPIs - + Calcium 600 mg daily, multivitamins - stopped Biotin (120 MCG), super B complex (?  Biotin)  Reviewed her TFTs: Lab Results  Component Value Date   TSH 2.48 09/22/2018   TSH 3.89 01/03/2018   TSH 6.51 (H) 10/09/2017   TSH 3.22 09/06/2017   TSH 2.71 10/08/2016   TSH 2.98 03/20/2016   TSH 6.15 (H) 01/23/2016   TSH 5.97 (H) 07/28/2015   TSH  5.45 (H) 02/22/2015   TSH 7.34 (H) 06/02/2014   FREET4 0.94 09/22/2018   FREET4 1.01 01/03/2018   FREET4 0.84 10/09/2017   FREET4 1.33 09/06/2017   FREET4 0.90 10/08/2016   FREET4 0.89 03/20/2016   FREET4 0.97 01/23/2016   FREET4 0.76 07/28/2015   FREET4 0.78 06/02/2014   FREET4 1.18 02/24/2014    She has a history of osteoporosis-on alendronate-when she remembers, HL - on Zocor.  She is on Viviscal (biotin, low-dose calcium) -for hair loss. On Nioxin shampoo.  Lab Results  Component Value Date   VD25OH 69.78 09/22/2018   VD25OH 35.11 09/06/2017   VD25OH 39.36 06/20/2016   VD25OH 39 11/24/2013   VD25OH 51 05/20/2013   She takes vitamin D 400 units daily.  ROS: Constitutional: + weight gain/no weight loss, no fatigue, no subjective hyperthermia, no subjective hypothermia Eyes: no blurry vision, no xerophthalmia ENT: no sore throat, no nodules palpated in neck, no dysphagia, no odynophagia, no hoarseness Cardiovascular: no CP/no SOB/no palpitations/no leg swelling Respiratory: no cough/no SOB/no wheezing Gastrointestinal: no N/no V/no D/no C/no acid reflux Musculoskeletal: no muscle aches/no joint aches Skin: no rashes, + hair loss Neurological: no tremors/no numbness/no tingling/no dizziness  I reviewed pt's medications, allergies, PMH, social hx, family hx, and changes were documented in the history of present illness. Otherwise, unchanged from my initial visit note.  Past Medical History:  Diagnosis Date  . GERD (gastroesophageal reflux disease)   .  Hyperlipidemia   . Thyroid disease    No past surgical history on file. History   Social History  . Marital Status: Married    Spouse Name: N/A    Number of Children: 1   Occupational History  . retail.   Social History Main Topics  . Smoking status: Former Smoker    Types: Cigarettes    Quit date: 01/18/1989  . Smokeless tobacco: Never Used  . Alcohol Use: 4.2 oz/week    7 Glasses of wine per week  . Drug  Use: No  . Sexual Activity: Yes    Partners: Male   Social History Narrative   Lives in [place] with husband. 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, Ship broker. Dog in home. Born in [place], raised in [place]. [place]. Previously lived in [place].      Diet - regular diet      Exercise - walking   Caffeine: coffee in the morning; cup of hot tea      Hobbies - yardwork      Work - [employer] in [place]   Current Outpatient Medications on File Prior to Visit  Medication Sig Dispense Refill  . alendronate (FOSAMAX) 70 MG tablet TAKE ONE TABLET BY MOUTH EVERY 7 DAYS WITH A FULL GLASS OF WATER ON AN EMPTY STOMACH    . amoxicillin (AMOXIL) 500 MG tablet Take 500 mg by mouth 2 (two) times daily. Take 4 times daily for as needed after dental work    . augmented betamethasone dipropionate (DIPROLENE-AF) 0.05 % cream     . b complex vitamins capsule Take by mouth.    . Cholecalciferol (D3 HIGH POTENCY) 125 MCG (5000 UT) capsule     . Cholecalciferol (VITAMIN D3) 50 MCG (2000 UT) capsule     . levothyroxine (SYNTHROID, LEVOTHROID) 88 MCG tablet Take 1 tablet (88 mcg total) by mouth daily. 90 tablet 3  . Multiple Vitamins-Minerals (CENTRUM SILVER PO) Take 1 tablet by mouth daily.    . simvastatin (ZOCOR) 20 MG tablet TAKE ONE TABLET BY MOUTH EVERY EVENING 30 tablet 0   No current facility-administered medications on file prior to visit.   No Known Allergies Family History  Problem Relation Age of Onset  . Breast cancer Mother 12  . Heart disease Father   . Cancer Father        Prostate  . Diabetes Father        diagnosed later in life  . Prostate cancer Father   . Asthma Brother    PE: BP 118/60   Pulse 74   Ht 5\' 6"  (1.676 m)   Wt 145 lb (65.8 kg)   SpO2 95%   BMI 23.40 kg/m  Wt Readings from Last 3 Encounters:  10/02/19 145 lb (65.8 kg)  10/15/18 138 lb 3.2 oz (62.7 kg)  10/13/18 138 lb 12.8 oz (63 kg)   Constitutional: normal weight, in NAD Eyes: PERRLA, EOMI, no exophthalmos ENT:  moist mucous membranes, no thyromegaly, no cervical lymphadenopathy Cardiovascular: RRR, No MRG Respiratory: CTA B Gastrointestinal: abdomen soft, NT, ND, BS+ Musculoskeletal: no deformities, strength intact in all 4 Skin: moist, warm, no rashes Neurological: no tremor with outstretched hands, DTR normal in all 4  ASSESSMENT: 1. MNG - Thyroid U/S (05/22/2013): heterogeneous gland, multinodular goiter - largest nodule in left lobe: 2.5 x 1.4 x 2.4 cm, solid, slightly echogenic, moderate internal vascularity, no calcifications.  - largest nodule on the right: 1.4 x 0.9 x 1.0 cm, predominantly  isoechoic,  with mild internal vascularity, no calcifications.   - FNA both nodules (06/12/2013): Adequacy Reason Satisfactory For Evaluation. Diagnosis THYROID, FINE NEEDLE ASPIRATION LEFT, (SPECIMEN 1 OF 2, COLLECTED ON 06/11/2013). 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 Claudette Laws MD Pathologist, Electronic Signature (Case signed 06/12/2013) Specimen Clinical Information Nontoxic uninodular goiter, nodule, 2.5 x 1.4 x 2.4cm dominant left nodule   Adequacy Reason Satisfactory For Evaluation. Diagnosis THYROID, FINE NEEDLE ASPIRATION RIGHT, (SPECIMEN 2 OF 2, COLLECTED ON 11/20 2014). BENIGN. FINDINGS CONSISTENT WITH GOITER. FINDINGS CONSISTENT WITH THE CONTENTS OF A CYST. Claudette Laws MD Pathologist, Electronic Signature (Case signed 06/12/2013) Specimen Clinical Information Nontoxic uninodular goiter, Nodule, 1.4 x 0.9 x 1.0cm domnant right lobe thyroid nodule  The first nodule was characterized as consistent with a follicular neoplasm and/or lesion. This was not an entity described in the Bethesda criteria. I was not sure how to interpret this. The treatment plan for a follicular lesion would be to repeat FNA, while for follicular neoplasm would be surgical lobectomy.  I discussed with the pathologist >> he suggested that we interpret this as FLUS.  Thyroid  U/S (02/21/2015):  Right thyroid lobe Measurements: 5.0 cm x 1.5 cm x 1.8 cm. Heterogeneous thyroid with relatively increased flow.  - Superior nodule measures 7 mm x 6 mm x 8 mm. - Previously biopsied lower right thyroid nodule measures 1.2 cm x 1.0 cm x 1.3 cm. Nodule is solid with internal reflectors.  Left thyroid lobe Measurements: 6.3 cm x 2.0 cm x 1.9 cm.  Heterogeneous left thyroid with relatively increased flow. - Nodule at the inferior aspect has been previously biopsied and measures 2.9 cm x 1.8 cm x 2.8 cm.  Isthmus Thickness: 3 mm. No nodules visualized.  Lymphadenopathy: None visualized.  Left thyroid nodule has increased in size. Due to previous history of follicular lesion of undetermined significance, I suggested repeat biopsy with [facility] molecular testing.     03/03/2015: FNA: Adequacy Reason Satisfactory For Evaluation. 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 COMMENT: THE SPECIMEN IS HYPERCELLULAR AND CONSISTS OF SMALL TO LARGE GROUPS OF FOLLICULAR EPITHELIAL CELLS WITH FOCAL HURTHLE CELL CHANGES. THERE IS MILD CYTOLOGIC ATYPIA, INCLUDING INTRANUCLEAR GROOVES. BASED ON THESE FEATURES, A FOLLICULAR LESION/NEOPLASM CAN NOT BE ENTIRELY RULED OUT. Specimen Clinical Information Nodule at the inferior aspect has been previously biopsied and measures 2.9 cm x 1.8 cm x 2.8 cm Source Thyroid, Fine Needle Aspiration, Left Lobe, (Specimen 1 of 1, collected on 03/02/15 )  AFIRMA test results were benign.  2. Hypothyroidism  3. Hair loss  PLAN: 1.  Multinodular goiter -She denies neck compression symptoms -Reviewed the report of the previous ultrasound along with the patient:  The 1.4 cm nodule is a cyst, so the risk of cancer is extremely low, also considering the results of her FNA obtained in  05/2013  The 2.5 cm nodule is solid, without calcifications, with mild to moderate internal blood flow, more wide than tall.  This nodule was biopsied and the biopsy returned inconclusive (FLUS x2), however, the Afirma molecular marker returned benign, which is associated with high risk of cancer less than 1 to 2%. -We are following her on a yearly basis, clinically, but at this visit, we discussed about obtaining another ultrasound, now 5 years from the previous - I will see her back in a year  2. Hypothyroidism - latest thyroid labs  reviewed with pt >> normal at last visit: Lab Results  Component Value Date   TSH 2.48 09/22/2018   - she continues on LT4 88 mcg daily - pt feels good on this dose. - we discussed about taking the thyroid hormone every day, with water, >30 minutes before breakfast, separated by >4 hours from acid reflux medications, calcium, iron, multivitamins. Pt. is taking it correctly. - will check thyroid tests today: TSH and fT4 - If labs are abnormal, she will need to return for repeat TFTs in 1.5 months  Needs refills.  Component     Latest Ref Rng & Units 10/02/2019  TSH     0.35 - 4.50 uIU/mL 3.51  T4,Free(Direct)     0.60 - 1.60 ng/dL 1.15  Normal labs.  Thyroid U/S (10/15/2019): Narrative & Impression    CLINICAL DATA:  Prior ultrasound follow-up. History of multinodular thyroid goiter and status post prior fine-needle aspiration of both right and left inferior nodules. The left inferior nodule has been sample twice including most recently on 03/02/2015. Cytology has demonstrated atypia undetermined significance (Bethesda 3).  THYROID ULTRASOUND Parenchymal Echotexture: Moderately heterogenous Isthmus: 0.4 cm Right lobe: 4.7 x 1.4 x 1.4 cm Left lobe: 5.8 x 1.4 x 1.7 cm _________________________________________________________  Nodule # 1: Prior biopsy: No Location: Right; Superior Maximum size: 1.0 cm; Other 2 dimensions: 0.9 x 0.6 cm,  previously, 0.8 cm Composition: solid/almost completely solid (2) Echogenicity: isoechoic (1) Change in features: Yes Given size (<1.4 cm) and appearance, this nodule does NOT meet TI-RADS criteria for biopsy or dedicated follow-up. This nodule is barely visualized currently and may or may not still be present. On the prior study, a clearly appeared spongiform and benign in appearance. ________________________________________________________  Nodule # 2: Prior biopsy: Yes Location: Right; Inferior Maximum size: 1.3 cm; Other 2 dimensions: 1.1 x 0.8 cm, previously, 1.3 cm Composition: solid/almost completely solid (2) Echogenicity: isoechoic (1) Given size (<1.4 cm) and appearance, this nodule does NOT meet TI-RADS criteria for biopsy or dedicated follow-up. This nodule appears stable. _________________________________________________________  Nodule # 3: Prior biopsy: Yes Location: Left; Inferior Maximum size: 2.4 cm; Other 2 dimensions: 2.2 x 1.5 cm, previously, 2.9 cm Composition: solid/almost completely solid (2) Echogenicity: isoechoic (1) This nodule shows similar morphology and does appear to be smaller in overall volume compared to the 2016 study. It clearly has not grown. ________________________________________________________  No abnormal lymph nodes identified.  IMPRESSION: 1. Residual nodule versus pseudo nodule in the superior right lobe measures 1 cm and does not meet criteria for biopsy or dedicated follow-up. 2. Stable 1.3 cm right inferior solid nodule which has been previously sampled. 3. The left inferior thyroid nodule which has been previously sampled does appear to be smaller in volume and with similar morphology compared to the prior study in 2016. This nodule currently measures 2.4 x 1.5 x 2.2 cm compared to 2.9 x 1.8 x 2.8 cm previously.  The above is in keeping with the ACR TI-RADS recommendations - J Am Coll Radiol  2017;14:587-595.   Electronically Signed   By: Aletta Edouard M.D.   On: 10/16/2019 11:41     Philemon Kingdom, MD PhD [facility] Endocrin[facility]

## 2019-10-02 NOTE — Patient Instructions (Addendum)
Please continue Levothyroxine 88 mcg daily.  Take the thyroid hormone every day, with water, at least 30 minutes before breakfast, separated by at least 4 hours from: - acid reflux medications - calcium - iron - multivitamins  Please stop at the lab.  Please come back for a follow-up appointment in 1 year. 

## 2019-10-13 ENCOUNTER — Other Ambulatory Visit: Payer: Self-pay | Admitting: Family Medicine

## 2019-10-14 ENCOUNTER — Telehealth: Payer: Self-pay | Admitting: Internal Medicine

## 2019-10-14 ENCOUNTER — Other Ambulatory Visit: Payer: Self-pay

## 2019-10-14 ENCOUNTER — Encounter: Payer: Self-pay | Admitting: Family Medicine

## 2019-10-14 ENCOUNTER — Encounter: Payer: Self-pay | Admitting: Internal Medicine

## 2019-10-14 ENCOUNTER — Ambulatory Visit (INDEPENDENT_AMBULATORY_CARE_PROVIDER_SITE_OTHER): Payer: PPO | Admitting: Family Medicine

## 2019-10-14 VITALS — BP 120/70 | HR 67 | Temp 96.4°F | Ht 66.0 in | Wt 142.0 lb

## 2019-10-14 DIAGNOSIS — M810 Age-related osteoporosis without current pathological fracture: Secondary | ICD-10-CM | POA: Diagnosis not present

## 2019-10-14 DIAGNOSIS — Z1211 Encounter for screening for malignant neoplasm of colon: Secondary | ICD-10-CM | POA: Diagnosis not present

## 2019-10-14 DIAGNOSIS — E785 Hyperlipidemia, unspecified: Secondary | ICD-10-CM

## 2019-10-14 DIAGNOSIS — Z1231 Encounter for screening mammogram for malignant neoplasm of breast: Secondary | ICD-10-CM | POA: Diagnosis not present

## 2019-10-14 DIAGNOSIS — Z0000 Encounter for general adult medical examination without abnormal findings: Secondary | ICD-10-CM

## 2019-10-14 LAB — LIPID PANEL
Cholesterol: 175 mg/dL (ref 0–200)
HDL: 71.9 mg/dL (ref >39.00)
LDL Cholesterol: 81 mg/dL (ref 0–99)
NonHDL: 102.87
Total CHOL/HDL Ratio: 2
Triglycerides: 110 mg/dL (ref 0.0–149.0)
VLDL: 22 mg/dL (ref 0.0–40.0)

## 2019-10-14 LAB — COMPREHENSIVE METABOLIC PANEL
ALT: 40 U/L — ABNORMAL HIGH (ref 0–35)
AST: 43 U/L — ABNORMAL HIGH (ref 0–37)
Albumin: 4.7 g/dL (ref 3.5–5.2)
Alkaline Phosphatase: 51 U/L (ref 39–117)
BUN: 23 mg/dL (ref 6–23)
CO2: 26 mEq/L (ref 19–32)
Calcium: 10.1 mg/dL (ref 8.4–10.5)
Chloride: 99 mEq/L (ref 96–112)
Creatinine, Ser: 0.77 mg/dL (ref 0.40–1.20)
GFR: 74.4 mL/min (ref >60.00)
Glucose, Bld: 86 mg/dL (ref 70–99)
Potassium: 4.2 mEq/L (ref 3.5–5.1)
Sodium: 135 mEq/L (ref 135–145)
Total Bilirubin: 0.8 mg/dL (ref 0.2–1.2)
Total Protein: 7.4 g/dL (ref 6.0–8.3)

## 2019-10-14 LAB — VITAMIN D 25 HYDROXY (VIT D DEFICIENCY, FRACTURES): VITD: 69.06 ng/mL (ref 30.00–100.00)

## 2019-10-14 NOTE — Telephone Encounter (Signed)
Hey Dr. Hilarie Fredrickson- This patient Shannon [name] is being referred to [place] for her colonoscopy. She previously had a colonoscopy in 2016 at [facility]. She would like to transfer her and specifically asked for you as her doctor as she has heard good things about you. The records from the last colonoscopy are in [place] and I have them printed to send to you for review. Please advise on scheduling. Thank you!

## 2019-10-14 NOTE — Telephone Encounter (Signed)
Ok for colonoscopy.  ?

## 2019-10-14 NOTE — Progress Notes (Signed)
Shannon Rumps, MD Phone: [phone]  Shannon [name] is a 69 y.o. female who presents today for CPE.  Diet: Pretty healthy.  Fruit and hard-boiled eggs for breakfast.  Lunch is a sandwich or a salad.  She has a protein and vegetables with a starch for dinner.  No soda or sweet tea. Exercise: Has been walking for exercise. Pap smear: Aged out.  No history of abnormal Pap smears.  She reports having had them consistently. Colonoscopy: Due for colonoscopy. Mammogram: Mammogram up-to-date.  Due in June. Menses: Postmenopausal, no bleeding Family history: Does have a family history of breast cancer.  No family history of colon cancer or ovarian cancer. Vaccines-   Flu: Up-to-date  Tetanus: Up-to-date  Shingles: Up-to-date  COVID19: Up-to-date  Pneumonia: Up-to-date Hep C Screening: Up-to-date Tobacco use: No Alcohol use: 2 glasses of wine daily Illicit Drug use: No Dentist: Yes Ophthalmology: Yes Patient notes chronic issues with intermittent constipation.  She never goes more than 2 days without a bowel movement.  She does also note occasional urine leakage though typically only occurs if she forgets to go to the bathroom and then goes for a long walk.  She started to wear a pad.  Active Ambulatory Problems    Diagnosis Date Noted  . Hyperlipidemia 05/20/2013  . Hypothyroidism 05/20/2013  . Osteoporosis 05/20/2013  . Thyroid nodule 05/20/2013  . Routine general medical examination at a health care facility 11/24/2013  . Hair loss 06/02/2014  . Erythematous rash 10/15/2018  . Carpal tunnel syndrome 02/10/2013  . Eczema of scalp 04/03/2017  . Elevated TSH 02/10/2013  . Ganglion cyst of finger 09/24/2018   Resolved Ambulatory Problems    Diagnosis Date Noted  . Need for prophylactic vaccination and inoculation against influenza 05/20/2013  . UTI (urinary tract infection) 11/09/2013  . Screening for breast cancer 11/24/2013  . Encounter for general adult medical examination  with abnormal findings 09/10/2017   Past Medical History:  Diagnosis Date  . GERD (gastroesophageal reflux disease)   . Thyroid disease     Family History  Problem Relation Age of Onset  . Breast cancer Mother 60  . Heart disease Father   . Cancer Father        Prostate  . Diabetes Father        diagnosed later in life  . Prostate cancer Father   . Asthma Brother     Social History   Socioeconomic History  . Marital status: Married    Spouse name: Not on file  . Number of children: Not on file  . Years of education: Not on file  . Highest education level: Not on file  Occupational History  . Not on file  Tobacco Use  . Smoking status: Former Smoker    Types: Cigarettes    Quit date: 01/18/1989    Years since quitting: 30.7  . Smokeless tobacco: Never Used  Substance and Sexual Activity  . Alcohol use: Yes    Alcohol/week: 7.0 standard drinks    Types: 7 Glasses of wine per week  . Drug use: No  . Sexual activity: Yes    Partners: Male  Other Topics Concern  . Not on file  Social History Narrative   Lives in [place] with husband. 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 Dog in home. Born in [place], raised in [place]. [place]. Previously lived in [place].      Diet - regular diet      Exercise - walking   Caffeine:  coffee in the morning; cup of hot tea      Hobbies - yardwork      Work - [employer] in [place] Strain:   . Difficulty of Paying Living Expenses:   Food Insecurity:   . Worried About Charity fundraiser in the Last Year:   . Arboriculturist in the Last Year:   Transportation Needs:   . Film/video editor (Medical):   Marland Kitchen Lack of Transportation (Non-Medical):   Physical Activity:   . Days of Exercise per Week:   . Minutes of Exercise per Session:   Stress:   . Feeling of Stress :   Social Connections:   . Frequency of Communication with Friends and Family:   . Frequency of Social  Gatherings with Friends and Family:   . Attends Religious Services:   . Active Member of Clubs or Organizations:   . Attends Archivist Meetings:   Marland Kitchen Marital Status:   Intimate Partner Violence:   . Fear of Current or Ex-Partner:   . Emotionally Abused:   Marland Kitchen Physically Abused:   . Sexually Abused:     ROS  General:  Negative for nexplained weight loss, fever Skin: Negative for new or changing [name], sore that won't heal HEENT: Negative for trouble hearing, trouble seeing, ringing in ears, mouth sores, hoarseness, change in voice, dysphagia. CV:  Negative for chest pain, dyspnea, edema, palpitations Resp: Negative for cough, dyspnea, hemoptysis GI: Positive for constipation, negative for nausea, vomiting, diarrhea, abdominal pain, melena, hematochezia. GU: Positive for incontinence, negative for dysuria, urinary hesitance, hematuria, vaginal or penile discharge, polyuria, sexual difficulty, lumps in testicle or breasts MSK: Negative for muscle cramps or aches, joint pain or swelling Neuro: Negative for headaches, weakness, numbness, dizziness, passing out/fainting Psych: Negative for depression, anxiety, memory problems  Objective  Physical Exam Vitals:   10/14/19 0814  BP: 120/70  Pulse: 67  Temp: (!) 96.4 F (35.8 C)    BP Readings from Last 3 Encounters:  10/14/19 120/70  10/02/19 118/60  10/15/18 124/82   Wt Readings from Last 3 Encounters:  10/14/19 142 lb (64.4 kg)  10/02/19 145 lb (65.8 kg)  10/15/18 138 lb 3.2 oz (62.7 kg)    Physical Exam Constitutional:      General: She is not in acute distress.    Appearance: She is not diaphoretic.  HENT:     Head: Normocephalic and atraumatic.  Eyes:     Conjunctiva/sclera: Conjunctivae normal.     Pupils: Pupils are equal, round, and reactive to light.  Cardiovascular:     Rate and Rhythm: Normal rate and regular rhythm.     Heart sounds: Normal heart sounds.  Pulmonary:     Effort: Pulmonary effort is  normal.     Breath sounds: Normal breath sounds.  Abdominal:     General: Bowel sounds are normal. There is no distension.     Palpations: Abdomen is soft.     Tenderness: There is no abdominal tenderness. There is no guarding or rebound.  Genitourinary:    Comments: Shannon [name], CMA served as chaperone, bilateral breast with no skin changes, nipple inversion, masses, or tenderness, no axillary masses bilaterally Musculoskeletal:     Right lower leg: No edema.     Left lower leg: No edema.  Lymphadenopathy:     Cervical: No cervical adenopathy.  Skin:    General: Skin is warm and  dry.  Neurological:     Mental Status: She is alert.  Psychiatric:        Mood and Affect: Mood normal.      Assessment/Plan:   Routine general medical examination at a health care facility Physical exam completed today.  Encouraged continued diet and exercise.  GI referral placed.  Vaccines up-to-date.  Discussed decreasing wine intake.  Mammogram ordered and patient knows she has to call to schedule.  Lab work as outlined below.  Encouraged monitoring constipation.  Also discussed urinating before she goes out to limit issues with leakage.   Orders Placed This Encounter  Procedures  . MM 3D SCREEN BREAST BILATERAL    Standing Status:   Future    Standing Expiration Date:   12/13/2020    Order Specific Question:   Reason for Exam (SYMPTOM  OR DIAGNOSIS REQUIRED)    Answer:   breast cancer screening    Order Specific Question:   Preferred imaging location?    Answer:   [facility]  . Lipid panel  . Comp Met (CMET)  . Vitamin D (25 hydroxy)  . Ambulatory referral to Gastroenterology    Referral Priority:   Routine    Referral Type:   Consultation    Referral Reason:   Specialty Services Required    Number of Visits Requested:   1    No orders of the defined types were placed in this encounter.   This visit occurred during the SARS-CoV-2 public health emergency.  Safety protocols were  in place, including screening questions prior to the visit, additional usage of staff PPE, and extensive cleaning of exam room while observing appropriate contact time as indicated for disinfecting solutions.    Shannon Rumps, MD [place]

## 2019-10-14 NOTE — Patient Instructions (Signed)
Nice to see you. Please continue to eat healthy and work on exercise. We will need to referred for colonoscopy. We will contact you with your lab results. Please make sure you try to urinate prior to going on walks. Please cut down on your alcohol use.

## 2019-10-14 NOTE — Telephone Encounter (Signed)
Patient scheduled for colonoscopy on 05/10 with Dr. Hilarie Fredrickson

## 2019-10-14 NOTE — Assessment & Plan Note (Addendum)
Physical exam completed today.  Encouraged continued diet and exercise.  GI referral placed.  Vaccines up-to-date.  Discussed decreasing wine intake.  Mammogram ordered and patient knows she has to call to schedule.  Lab work as outlined below.  Encouraged monitoring constipation.  Also discussed urinating before she goes out to limit issues with leakage.

## 2019-10-16 ENCOUNTER — Other Ambulatory Visit: Payer: PPO

## 2019-10-16 ENCOUNTER — Ambulatory Visit
Admission: RE | Admit: 2019-10-16 | Discharge: 2019-10-16 | Disposition: A | Discharge status: Home / Self Care | Payer: PPO | Source: Ambulatory Visit | Attending: Internal Medicine | Admitting: Internal Medicine

## 2019-10-16 DIAGNOSIS — E041 Nontoxic single thyroid nodule: Secondary | ICD-10-CM

## 2019-10-22 ENCOUNTER — Telehealth: Payer: Self-pay | Admitting: Family Medicine

## 2019-10-22 NOTE — Telephone Encounter (Signed)
Pt returned your calls about lab results

## 2019-11-11 ENCOUNTER — Other Ambulatory Visit: Payer: Self-pay | Admitting: Family Medicine

## 2019-11-17 ENCOUNTER — Ambulatory Visit (AMBULATORY_SURGERY_CENTER): Payer: Self-pay

## 2019-11-17 ENCOUNTER — Other Ambulatory Visit: Payer: Self-pay

## 2019-11-17 VITALS — Temp 97.5°F | Ht 66.0 in | Wt 144.4 lb

## 2019-11-17 DIAGNOSIS — Z1211 Encounter for screening for malignant neoplasm of colon: Secondary | ICD-10-CM

## 2019-11-17 NOTE — Progress Notes (Signed)
No allergies to soy or egg Pt is not on blood thinners or diet pills Denies issues with sedation/intubation Denies atrial flutter/fib Denies constipation   Emmi instructions given to pt  Pt is aware of Covid safety and care partner requirements.  

## 2019-11-19 DIAGNOSIS — L821 Other seborrheic keratosis: Secondary | ICD-10-CM | POA: Diagnosis not present

## 2019-11-19 DIAGNOSIS — D485 Neoplasm of uncertain behavior of skin: Secondary | ICD-10-CM | POA: Diagnosis not present

## 2019-11-19 DIAGNOSIS — D2261 Melanocytic nevi of right upper limb, including shoulder: Secondary | ICD-10-CM | POA: Diagnosis not present

## 2019-11-19 DIAGNOSIS — L820 Inflamed seborrheic keratosis: Secondary | ICD-10-CM | POA: Diagnosis not present

## 2019-11-19 DIAGNOSIS — L578 Other skin changes due to chronic exposure to nonionizing radiation: Secondary | ICD-10-CM | POA: Diagnosis not present

## 2019-11-19 DIAGNOSIS — L308 Other specified dermatitis: Secondary | ICD-10-CM | POA: Diagnosis not present

## 2019-11-26 ENCOUNTER — Encounter: Payer: Self-pay | Admitting: Internal Medicine

## 2019-11-30 ENCOUNTER — Encounter: Payer: PPO | Admitting: Internal Medicine

## 2019-12-01 ENCOUNTER — Other Ambulatory Visit: Payer: Self-pay

## 2019-12-01 ENCOUNTER — Ambulatory Visit (AMBULATORY_SURGERY_CENTER): Payer: PPO | Admitting: Internal Medicine

## 2019-12-01 ENCOUNTER — Encounter: Payer: Self-pay | Admitting: Internal Medicine

## 2019-12-01 VITALS — BP 115/69 | HR 62 | Temp 97.3°F | Resp 17 | Ht 66.0 in | Wt 144.4 lb

## 2019-12-01 DIAGNOSIS — Z1211 Encounter for screening for malignant neoplasm of colon: Secondary | ICD-10-CM

## 2019-12-01 DIAGNOSIS — D123 Benign neoplasm of transverse colon: Secondary | ICD-10-CM | POA: Diagnosis not present

## 2019-12-01 DIAGNOSIS — Z86010 Personal history of colonic polyps: Secondary | ICD-10-CM | POA: Diagnosis not present

## 2019-12-01 DIAGNOSIS — D125 Benign neoplasm of sigmoid colon: Secondary | ICD-10-CM | POA: Diagnosis not present

## 2019-12-01 DIAGNOSIS — K635 Polyp of colon: Secondary | ICD-10-CM | POA: Diagnosis not present

## 2019-12-01 MED ORDER — SODIUM CHLORIDE 0.9 % IV SOLN: 500.0000 mL | Freq: Once | INTRAVENOUS | Status: DC

## 2019-12-01 NOTE — Patient Instructions (Signed)
Handouts given for polyps, diverticulosis and high fiber diet.  YOU HAD AN ENDOSCOPIC PROCEDURE TODAY AT [facility]:   Refer to the procedure report that was given to you for any specific questions about what was found during the examination.  If the procedure report does not answer your questions, please call your gastroenterologist to clarify.  If you requested that your care partner not be given the details of your procedure findings, then the procedure report has been included in a sealed envelope for you to review at your convenience later.  YOU SHOULD EXPECT: Some feelings of bloating in the abdomen. Passage of more gas than usual.  Walking can help get rid of the air that was put into your GI tract during the procedure and reduce the bloating. If you had a lower endoscopy (such as a colonoscopy or flexible sigmoidoscopy) you may notice spotting of blood in your stool or on the toilet paper. If you underwent a bowel prep for your procedure, you may not have a normal bowel movement for a few days.  Please Note:  You might notice some irritation and congestion in your nose or some drainage.  This is from the oxygen used during your procedure.  There is no need for concern and it should clear up in a day or so.  SYMPTOMS TO REPORT IMMEDIATELY:   Following lower endoscopy (colonoscopy or flexible sigmoidoscopy):  Excessive amounts of blood in the stool  Significant tenderness or worsening of abdominal pains  Swelling of the abdomen that is new, acute  Fever of 100F or higher  For urgent or emergent issues, a gastroenterologist can be reached at any hour by calling [phone]. Do not use MyChart messaging for urgent concerns.    DIET:  We do recommend a small meal at first, but then you may proceed to your regular diet.  Drink plenty of fluids but you should avoid alcoholic beverages for 24 hours.  ACTIVITY:  You should plan to take it easy for the rest of today and you  should NOT DRIVE or use heavy machinery until tomorrow (because of the sedation medicines used during the test).    FOLLOW UP: Our staff will call the number listed on your records 48-72 hours following your procedure to check on you and address any questions or concerns that you may have regarding the information given to you following your procedure. If we do not reach you, we will leave a message.  We will attempt to reach you two times.  During this call, we will ask if you have developed any symptoms of COVID 19. If you develop any symptoms (ie: fever, flu-like symptoms, shortness of breath, cough etc.) before then, please call [phone].  If you test positive for Covid 19 in the 2 weeks post procedure, please call and report this information to [place].    If any biopsies were taken you will be contacted by phone or by letter within the next 1-3 weeks.  Please call us at [phone] if you have not heard about the biopsies in 3 weeks.    SIGNATURES/CONFIDENTIALITY: You and/or your care partner have signed paperwork which will be entered into your electronic medical record.  These signatures attest to the fact that that the information above on your After Visit Summary has been reviewed and is understood.  Full responsibility of the confidentiality of this discharge information lies with you and/or your care-partner.

## 2019-12-01 NOTE — Progress Notes (Signed)
Called to room to assist during endoscopic procedure.  Patient ID and intended procedure confirmed with present staff. Received instructions for my participation in the procedure from the performing physician.  

## 2019-12-01 NOTE — Progress Notes (Signed)
Temp-LS VS-CW  Pt's states no medical or surgical changes since previsit or office visit.

## 2019-12-01 NOTE — Op Note (Signed)
[facility] Patient Name: Shannon [name] Procedure Date: 12/01/2019 9:07 AM MRN: [id number] Endoscopist: Jerene Bears , MD Age: 69 Referring MD:  Date of Birth: May 05, 1951 Gender: Female Account #: [id number] Procedure:                Colonoscopy Indications:              High risk colon cancer surveillance: Personal                            history of colonic polyps, Last colonoscopy: March                            2016 Medicines:                Monitored Anesthesia Care Procedure:                Pre-Anesthesia Assessment:                           - Prior to the procedure, a History and Physical                            was performed, and patient medications and                            allergies were reviewed. The patient's tolerance of                            previous anesthesia was also reviewed. The risks                            and benefits of the procedure and the sedation                            options and risks were discussed with the patient.                            All questions were answered, and informed consent                            was obtained. Prior Anticoagulants: The patient has                            taken no previous anticoagulant or antiplatelet                            agents. ASA Grade Assessment: II - A patient with                            mild systemic disease. After reviewing the risks                            and benefits, the patient was deemed in  satisfactory condition to undergo the procedure.                           After obtaining informed consent, the colonoscope                            was passed under direct vision. Throughout the                            procedure, the patient's blood pressure, pulse, and                            oxygen saturations were monitored continuously. The                            Colonoscope was introduced through the anus and                  advanced to the cecum, identified by appendiceal                            orifice and ileocecal valve. The colonoscopy was                            performed without difficulty. The patient tolerated                            the procedure well. The quality of the bowel                            preparation was good. The ileocecal valve,                            appendiceal orifice, and rectum were photographed.                            The bowel preparation used was Miralax via split                            dose instruction. Scope In: 9:19:45 AM Scope Out: 9:40:25 AM Scope Withdrawal Time: 0 hours 13 minutes 54 seconds  Total Procedure Duration: 0 hours 20 minutes 40 seconds  Findings:                 The digital rectal exam was normal.                           A 5 mm polyp was found in the hepatic flexure. The                            polyp was sessile. The polyp was removed with a                            cold snare. Resection and retrieval were complete.  A 5 mm polyp was found in the distal sigmoid colon.                            The polyp was sessile. The polyp was removed with a                            cold snare. Resection and retrieval were complete.                           Multiple small and large-mouthed diverticula were                            found in the sigmoid colon, descending colon,                            hepatic flexure and ascending colon.                           The retroflexed view of the distal rectum and anal                            verge was normal and showed no anal or rectal                            abnormalities. Complications:            No immediate complications. Estimated Blood Loss:     Estimated blood loss was minimal. Impression:               - One 5 mm polyp at the hepatic flexure, removed                            with a cold snare. Resected and retrieved.                            - One 5 mm polyp in the distal sigmoid colon,                            removed with a cold snare. Resected and retrieved.                           - Moderate diverticulosis in the sigmoid colon, in                            the descending colon, at the hepatic flexure and in                            the ascending colon.                           - The distal rectum and anal verge are normal on  retroflexion view. Recommendation:           - Patient has a contact number available for                            emergencies. The signs and symptoms of potential                            delayed complications were discussed with the                            patient. Return to normal activities tomorrow.                            Written discharge instructions were provided to the                            patient.                           - Resume previous diet.                           - Continue present medications.                           - Await pathology results.                           - Repeat colonoscopy is recommended. The                            colonoscopy date will be determined after pathology                            results from today's exam become available for                            review. Jerene Bears, MD 12/01/2019 9:44:10 AM This report has been signed electronically.

## 2019-12-01 NOTE — Progress Notes (Signed)
To PACU, VSS. Report to [email or web address] 

## 2019-12-03 ENCOUNTER — Telehealth: Payer: Self-pay

## 2019-12-03 NOTE — Telephone Encounter (Signed)
  Follow up Call-  Call back number 12/01/2019  Post procedure Call Back phone  # [phone]  Permission to leave phone message Yes  Some recent data might be hidden     Patient questions:  Do you have a fever, pain , or abdominal swelling? No. Pain Score  0 *  Have you tolerated food without any problems? Yes.    Have you been able to return to your normal activities? Yes.    Do you have any questions about your discharge instructions: Diet   No. Medications  No. Follow up visit  No.  Do you have questions or concerns about your Care? No.  Actions: * If pain score is 4 or above: No action needed, pain <4. 1. Have you developed a fever since your procedure? no  2.   Have you had an respiratory symptoms (SOB or cough) since your procedure? no  3.   Have you tested positive for COVID 19 since your procedure no  4.   Have you had any family members/close contacts diagnosed with the COVID 19 since your procedure?  no   If yes to any of these questions please route to Joylene John, RN and Erenest Rasher, RN

## 2019-12-07 ENCOUNTER — Encounter: Payer: Self-pay | Admitting: Internal Medicine

## 2019-12-08 ENCOUNTER — Other Ambulatory Visit: Payer: Self-pay | Admitting: Family Medicine

## 2019-12-16 ENCOUNTER — Encounter: Payer: Self-pay | Admitting: Family Medicine

## 2019-12-30 ENCOUNTER — Ambulatory Visit
Admission: RE | Admit: 2019-12-30 | Discharge: 2019-12-30 | Disposition: A | Discharge status: Home / Self Care | Payer: PPO | Source: Ambulatory Visit | Attending: Family Medicine | Admitting: Family Medicine

## 2019-12-30 DIAGNOSIS — Z1231 Encounter for screening mammogram for malignant neoplasm of breast: Secondary | ICD-10-CM | POA: Diagnosis not present

## 2020-01-10 ENCOUNTER — Other Ambulatory Visit: Payer: Self-pay | Admitting: Family Medicine

## 2020-02-15 ENCOUNTER — Ambulatory Visit (INDEPENDENT_AMBULATORY_CARE_PROVIDER_SITE_OTHER): Payer: PPO

## 2020-02-15 VITALS — Ht 66.0 in | Wt 144.0 lb

## 2020-02-15 DIAGNOSIS — Z0000 Encounter for general adult medical examination without abnormal findings: Secondary | ICD-10-CM

## 2020-02-15 NOTE — Progress Notes (Signed)
Subjective:   Shannon [name] is a 69 y.o. female who presents for an Initial Medicare Annual Wellness Visit.  Review of Systems    No ROS.  Medicare Wellness Virtual Visit.     Cardiac Risk Factors include: advanced age (>33men, >83 women)     Objective:    Today's Vitals   02/15/20 1337  Weight: 144 lb (65.3 kg)  Height: 5\' 6"  (1.676 m)   Body mass index is 23.24 kg/m.  Advanced Directives 02/15/2020  Does Patient Have a Medical Advance Directive? No  Would patient like information on creating a medical advance directive? No - Patient declined    Current Medications (verified) Outpatient Encounter Medications as of 02/15/2020  Medication Sig  . augmented betamethasone dipropionate (DIPROLENE-AF) 0.05 % cream   . b complex vitamins capsule Take by mouth.  . Cholecalciferol (D3 HIGH POTENCY) 125 MCG (5000 UT) capsule   . Cholecalciferol (VITAMIN D3) 50 MCG (2000 UT) capsule   . levothyroxine (SYNTHROID) 88 MCG tablet Take 1 tablet (88 mcg total) by mouth daily.  . Multiple Vitamins-Minerals (CENTRUM SILVER PO) Take 1 tablet by mouth daily.  . simvastatin (ZOCOR) 20 MG tablet TAKE ONE TABLET BY MOUTH EVERY EVENING   No facility-administered encounter medications on file as of 02/15/2020.    Allergies (verified) Patient has no known allergies.   History: Past Medical History:  Diagnosis Date  . GERD (gastroesophageal reflux disease)   . Hyperlipidemia   . Osteoporosis    now has osteopenia  . Thyroid disease    Past Surgical History:  Procedure Laterality Date  . COLOSTOMY  2016  . HAND SURGERY     40 yrs ago   Family History  Problem Relation Age of Onset  . Breast cancer Mother 53  . Heart disease Father   . Cancer Father        Prostate  . Diabetes Father        diagnosed later in life  . Prostate cancer Father   . Colon polyps Father 60  . Parkinson's disease Father   . Congestive Heart Failure Father   . Asthma Brother   . Colon cancer Neg Hx    . Rectal cancer Neg Hx   . Stomach cancer Neg Hx   . Esophageal cancer Neg Hx    Social History   Socioeconomic History  . Marital status: Married    Spouse name: Not on file  . Number of children: Not on file  . Years of education: Not on file  . Highest education level: Not on file  Occupational History  . Not on file  Tobacco Use  . Smoking status: Former Smoker    Types: Cigarettes    Quit date: 01/18/1989    Years since quitting: 31.0  . Smokeless tobacco: Never Used  Vaping Use  . Vaping Use: Never used  Substance and Sexual Activity  . Alcohol use: Yes    Alcohol/week: 7.0 standard drinks    Types: 7 Glasses of wine per week  . Drug use: No  . Sexual activity: Yes    Partners: Male  Other Topics Concern  . Not on file  Social History Narrative   Lives in [place] with husband. 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 Dog in home. Born in [place], raised in [place]. [place]. Previously lived in [place].      Diet - regular diet      Exercise - walking   Caffeine: coffee in the morning;  cup of hot tea      Hobbies - yardwork      Work - [employer] in [place] Strain: [place]   . Difficulty of Paying Living Expenses: Not hard at all  Food Insecurity: No Food Insecurity  . Worried About Charity fundraiser in the Last Year: Never true  . Ran Out of Food in the Last Year: Never true  Transportation Needs: No Transportation Needs  . Lack of Transportation (Medical): No  . Lack of Transportation (Non-Medical): No  Physical Activity:   . Days of Exercise per Week:   . Minutes of Exercise per Session:   Stress: No Stress Concern Present  . Feeling of Stress : Only a little  Social Connections: Unknown  . Frequency of Communication with Friends and Family: Not on file  . Frequency of Social Gatherings with Friends and Family: Not on file  . Attends Religious Services: Not on file  . Active Member of Clubs or  Organizations: Not on file  . Attends Archivist Meetings: Not on file  . Marital Status: Married    Tobacco Counseling Counseling given: Not Answered   Clinical Intake:  Pre-visit preparation completed: Yes        Diabetes: No  How often do you need to have someone help you when you read instructions, pamphlets, or other written materials from your doctor or pharmacy?: 1 - Never  Interpreter Needed?: No      Activities of Daily Living In your present state of health, do you have any difficulty performing the following activities: 02/15/2020  Hearing? N  Vision? N  Difficulty concentrating or making decisions? N  Walking or climbing stairs? N  Dressing or bathing? N  Doing errands, shopping? N  Preparing Food and eating ? N  Using the Toilet? N  In the past six months, have you accidently leaked urine? N  Do you have problems with loss of bowel control? N  Managing your Medications? N  Managing your Finances? N  Housekeeping or managing your Housekeeping? N  Some recent data might be hidden    Patient Care Team: Leone Haven, MD as PCP - General (Family Medicine)  Indicate any recent Medical Services you may have received from other than [facility] providers in the past year (date may be approximate).     Assessment:   This is a routine wellness examination for Shannon [name].  I connected with Shannon [name] today by telephone and verified that I am speaking with the correct person using two identifiers. Location patient: home Location provider: work Persons participating in the virtual visit: patient, Marine scientist.    I discussed the limitations, risks, security and privacy concerns of performing an evaluation and management service by telephone and the availability of in person appointments. The patient expressed understanding and verbally consented to this telephonic visit.    Interactive audio and video telecommunications were attempted between this provider and  patient, however failed, due to patient having technical difficulties OR patient did not have access to video capability.  We continued and completed visit with audio only.  Some vital signs may be absent or patient reported.   Hearing/Vision screen  Hearing Screening   125Hz  250Hz  500Hz  1000Hz  2000Hz  3000Hz  4000Hz  6000Hz  8000Hz   Right ear:           Left ear:           Comments: Patient is able to  hear conversational tones without difficulty.  No issues reported.   Vision Screening Comments: Followed by Lorie Apley Visual acuity not assessed, virtual visit.  They have seen their ophthalmologist in the last 12 months.     Dietary issues and exercise activities discussed: Current Exercise Habits: Home exercise routine, Type of exercise: walking, Intensity: Moderate  Healthy diet Good water intake  Goals      Patient Stated   .  Increase physical activity (pt-stated)      Start playing golf more      Depression Screen PHQ 2/9 Scores 02/15/2020 10/14/2019 09/10/2017 01/23/2016 02/22/2015 11/24/2013  PHQ - 2 Score 0 0 0 0 0 0    Fall Risk Fall Risk  02/15/2020 10/14/2019 09/10/2017 01/23/2016  Falls in the past year? 0 0 No No  Number falls in past yr: 0 0 - -  Follow up Falls evaluation completed Falls evaluation completed - -   Handrails in use when climbing stairs? Yes  Home free of loose throw rugs in walkways, pet beds, electrical cords, etc? Yes  Adequate lighting in your home to reduce risk of falls? Yes   ASSISTIVE DEVICES UTILIZED TO PREVENT FALLS: Life alert? No  Use of a cane, walker or w/c? No  Grab bars in the bathroom? No  Shower chair or bench in shower? No  Elevated toilet seat or a handicapped toilet? No   TIMED UP AND GO:  Was the test performed? No .   Cognitive Function: Patient is alert and oriented x3.  Currently works 3 days weekly.  Denies difficulty focusing, concentrating, making decisions, memory loss.  MMSE - Mini Mental State Exam 02/15/2020    Not completed: Unable to complete       Immunizations Immunization History  Administered Date(s) Administered  . Influenza, High Dose Seasonal PF 05/03/2018  . Influenza,inj,Quad PF,6+ Mos 05/20/2013, 06/02/2014  . Influenza-Unspecified 05/20/2016, 06/28/2017, 04/17/2019  . PFIZER SARS-COV-2 Vaccination 08/12/2019, 09/02/2019  . Pneumococcal Conjugate-13 09/10/2017  . Pneumococcal Polysaccharide-23 10/13/2018  . Tdap 05/21/2011  . Zoster 05/20/2012, 08/13/2012  . Zoster Recombinat (Shingrix) 09/10/2017, 06/06/2018    Health Maintenance There are no preventive care reminders to display for this patient. Health Maintenance  Topic Date Due  . INFLUENZA VACCINE  02/21/2020  . TETANUS/TDAP  05/20/2021  . MAMMOGRAM  12/29/2021  . COLONOSCOPY  12/01/2026  . DEXA SCAN  Completed  . COVID-19 Vaccine  Completed  . Hepatitis C Screening  Completed  . PNA vac Low Risk Adult  Completed   Dental Screening: Recommended annual dental exams for proper oral hygiene. Visits every 3 months.   Community Resource Referral / Chronic Care Management: CRR required this visit?  No   CCM required this visit?  No      Plan:    Keep all routine maintenance appointments.   Cpe 10/14/20 @ 8:30  I have personally reviewed and noted the following in the patient's chart:   . Medical and social history . Use of alcohol, tobacco or illicit drugs  . Current medications and supplements . Functional ability and status . Nutritional status . Physical activity . Advanced directives . List of other physicians . Hospitalizations, surgeries, and ER visits in previous 12 months . Vitals . Screenings to include cognitive, depression, and falls . Referrals and appointments  In addition, I have reviewed and discussed with patient certain preventive protocols, quality metrics, and best practice recommendations. A written personalized care plan for preventive services as well as general preventive health  recommendations were provided to patient via mychart.      Varney Biles, LPN   7/57/3225

## 2020-02-15 NOTE — Patient Instructions (Addendum)
Shannon [name] , Thank you for taking time to come for your Medicare Wellness Visit. I appreciate your ongoing commitment to your health goals. Please review the following plan we discussed and let me know if I can assist you in the future.   These are the goals we discussed: Goals      Patient Stated   .  Increase physical activity (pt-stated)      Start playing golf more       This is a list of the screening recommended for you and due dates:  Health Maintenance  Topic Date Due  . Flu Shot  02/21/2020  . Tetanus Vaccine  05/20/2021  . Mammogram  12/29/2021  . Colon Cancer Screening  12/01/2026  . DEXA scan (bone density measurement)  Completed  . COVID-19 Vaccine  Completed  .  Hepatitis C: One time screening is recommended by Center for Disease Control  (CDC) for  adults born from [birth year] through 1965.   Completed  . Pneumonia vaccines  Completed   mmunizations Immunization History  Administered Date(s) Administered  . Influenza, High Dose Seasonal PF 05/03/2018  . Influenza,inj,Quad PF,6+ Mos 05/20/2013, 06/02/2014  . Influenza-Unspecified 05/20/2016, 06/28/2017, 04/17/2019  . PFIZER SARS-COV-2 Vaccination 08/12/2019, 09/02/2019  . Pneumococcal Conjugate-13 09/10/2017  . Pneumococcal Polysaccharide-23 10/13/2018  . Tdap 05/21/2011  . Zoster 05/20/2012, 08/13/2012  . Zoster Recombinat (Shingrix) 09/10/2017, 06/06/2018   Keep all routine maintenance appointments.   Cpe 10/14/20 @ 8:30  Advanced directives: declined  Conditions/risks identified: none new  Follow up in one year for your annual wellness visit.   Preventive Care 80 Years and Older, Female Preventive care refers to lifestyle choices and visits with your health care provider that can promote health and wellness. What does preventive care include?  A yearly physical exam. This is also called an annual well check.  Dental exams once or twice a year.  Routine eye exams. Ask your health care provider how  often you should have your eyes checked.  Personal lifestyle choices, including:  Daily care of your teeth and gums.  Regular physical activity.  Eating a healthy diet.  Avoiding tobacco and drug use.  Limiting alcohol use.  Practicing safe sex.  Taking low-dose aspirin every day.  Taking vitamin and mineral supplements as recommended by your health care provider. What happens during an annual well check? The services and screenings done by your health care provider during your annual well check will depend on your age, overall health, lifestyle risk factors, and family history of disease. Counseling  Your health care provider may ask you questions about your:  Alcohol use.  Tobacco use.  Drug use.  Emotional well-being.  Home and relationship well-being.  Sexual activity.  Eating habits.  History of falls.  Memory and ability to understand (cognition).  Work and work Statistician.  Reproductive health. Screening  You may have the following tests or measurements:  Height, weight, and BMI.  Blood pressure.  Lipid and cholesterol levels. These may be checked every 5 years, or more frequently if you are over 69 years old.  Skin check.  Lung cancer screening. You may have this screening every year starting at age 52 if you have a 30-pack-year history of smoking and currently smoke or have quit within the past 15 years.  Fecal occult blood test (FOBT) of the stool. You may have this test every year starting at age 76.  Flexible sigmoidoscopy or colonoscopy. You may have a sigmoidoscopy every 5 years or  a colonoscopy every 10 years starting at age 61.  Hepatitis C blood test.  Hepatitis B blood test.  Sexually transmitted disease (STD) testing.  Diabetes screening. This is done by checking your blood sugar (glucose) after you have not eaten for a while (fasting). You may have this done every 1-3 years.  Bone density scan. This is done to screen for  osteoporosis. You may have this done starting at age 105.  Mammogram. This may be done every 1-2 years. Talk to your health care provider about how often you should have regular mammograms. Talk with your health care provider about your test results, treatment options, and if necessary, the need for more tests. Vaccines  Your health care provider may recommend certain vaccines, such as:  Influenza vaccine. This is recommended every year.  Tetanus, diphtheria, and acellular pertussis (Tdap, Td) vaccine. You may need a Td booster every 10 years.  Zoster vaccine. You may need this after age 73.  Pneumococcal 13-valent conjugate (PCV13) vaccine. One dose is recommended after age 15.  Pneumococcal polysaccharide (PPSV23) vaccine. One dose is recommended after age 84. Talk to your health care provider about which screenings and vaccines you need and how often you need them. This information is not intended to replace advice given to you by your health care provider. Make sure you discuss any questions you have with your health care provider. Document Released: 08/05/2015 Document Revised: 03/28/2016 Document Reviewed: 05/10/2015 Elsevier Interactive Patient Education  2017 [place] Prevention in the Home Falls can cause injuries. They can happen to people of all ages. There are many things you can do to make your home safe and to help prevent falls. What can I do on the outside of my home?  Regularly fix the edges of walkways and driveways and fix any cracks.  Remove anything that might make you trip as you walk through a door, such as a raised step or threshold.  Trim any bushes or trees on the path to your home.  Use bright outdoor lighting.  Clear any walking paths of anything that might make someone trip, such as rocks or tools.  Regularly check to see if handrails are loose or broken. Make sure that both sides of any steps have handrails.  Any raised decks and porches  should have guardrails on the edges.  Have any leaves, snow, or ice cleared regularly.  Use sand or salt on walking paths during winter.  Clean up any spills in your garage right away. This includes oil or grease spills. What can I do in the bathroom?  Use night lights.  Install grab bars by the toilet and in the tub and shower. Do not use towel bars as grab bars.  Use non-skid mats or decals in the tub or shower.  If you need to sit down in the shower, use a plastic, non-slip stool.  Keep the floor dry. Clean up any water that spills on the floor as soon as it happens.  Remove soap buildup in the tub or shower regularly.  Attach bath mats securely with double-sided non-slip rug tape.  Do not have throw rugs and other things on the floor that can make you trip. What can I do in the bedroom?  Use night lights.  Make sure that you have a light by your bed that is easy to reach.  Do not use any sheets or blankets that are too big for your bed. They should not hang down onto  the floor.  Have a firm chair that has side arms. You can use this for support while you get dressed.  Do not have throw rugs and other things on the floor that can make you trip. What can I do in the kitchen?  Clean up any spills right away.  Avoid walking on wet floors.  Keep items that you use a lot in easy-to-reach places.  If you need to reach something above you, use a strong step stool that has a grab bar.  Keep electrical cords out of the way.  Do not use floor polish or wax that makes floors slippery. If you must use wax, use non-skid floor wax.  Do not have throw rugs and other things on the floor that can make you trip. What can I do with my stairs?  Do not leave any items on the stairs.  Make sure that there are handrails on both sides of the stairs and use them. Fix handrails that are broken or loose. Make sure that handrails are as long as the stairways.  Check any carpeting to  make sure that it is firmly attached to the stairs. Fix any carpet that is loose or worn.  Avoid having throw rugs at the top or bottom of the stairs. If you do have throw rugs, attach them to the floor with carpet tape.  Make sure that you have a light switch at the top of the stairs and the bottom of the stairs. If you do not have them, ask someone to add them for you. What else can I do to help prevent falls?  Wear shoes that:  Do not have high heels.  Have rubber bottoms.  Are comfortable and fit you well.  Are closed at the toe. Do not wear sandals.  If you use a stepladder:  Make sure that it is fully opened. Do not climb a closed stepladder.  Make sure that both sides of the stepladder are locked into place.  Ask someone to hold it for you, if possible.  Clearly mark and make sure that you can see:  Any grab bars or handrails.  First and last steps.  Where the edge of each step is.  Use tools that help you move around (mobility aids) if they are needed. These include:  Canes.  Walkers.  Scooters.  Crutches.  Turn on the lights when you go into a dark area. Replace any light bulbs as soon as they burn out.  Set up your furniture so you have a clear path. Avoid moving your furniture around.  If any of your floors are uneven, fix them.  If there are any pets around you, be aware of where they are.  Review your medicines with your doctor. Some medicines can make you feel dizzy. This can increase your chance of falling. Ask your doctor what other things that you can do to help prevent falls. This information is not intended to replace advice given to you by your health care provider. Make sure you discuss any questions you have with your health care provider. Document Released: 05/05/2009 Document Revised: 12/15/2015 Document Reviewed: 08/13/2014 Elsevier Interactive Patient Education  2017 [employer].

## 2020-03-18 ENCOUNTER — Ambulatory Visit: Payer: PPO | Attending: Internal Medicine

## 2020-03-18 DIAGNOSIS — Z23 Encounter for immunization: Secondary | ICD-10-CM

## 2020-03-18 NOTE — Progress Notes (Signed)
   Cov[facility] Vaccination [facility]  Name:  Shannon [name]    MRN: [id number] DOB: 25-Feb-1951  03/18/2020  Ms. Music was observed post Covid-19 immunization for 15 minutes without incident. She was provided with Vaccine Information Sheet and instruction to access the V-Safe system.   [name] was instructed to call 911 with any severe reactions post vaccine: Marland Kitchen Difficulty breathing  . Swelling of face and throat  . A fast heartbeat  . A bad rash all over body  . Dizziness and weakness

## 2020-05-08 ENCOUNTER — Other Ambulatory Visit: Payer: Self-pay | Admitting: Family Medicine

## 2020-05-09 ENCOUNTER — Other Ambulatory Visit: Payer: Self-pay

## 2020-05-26 ENCOUNTER — Encounter: Payer: Self-pay | Admitting: Family Medicine

## 2020-05-27 ENCOUNTER — Other Ambulatory Visit: Payer: Self-pay

## 2020-05-27 MED ORDER — SIMVASTATIN 20 MG PO TABS: 20.0000 mg | ORAL_TABLET | Freq: Every evening | ORAL | 1 refills | Status: DC

## 2020-09-11 ENCOUNTER — Telehealth: Payer: PPO | Admitting: Nurse Practitioner

## 2020-09-11 DIAGNOSIS — N390 Urinary tract infection, site not specified: Secondary | ICD-10-CM | POA: Diagnosis not present

## 2020-09-11 MED ORDER — NITROFURANTOIN MONOHYD MACRO 100 MG PO CAPS: 100.0000 mg | ORAL_CAPSULE | Freq: Two times a day (BID) | ORAL | 0 refills | Status: AC

## 2020-09-11 MED ORDER — PHENAZOPYRIDINE HCL 100 MG PO TABS: 100.0000 mg | ORAL_TABLET | Freq: Three times a day (TID) | ORAL | 0 refills | Status: AC | PRN

## 2020-09-11 NOTE — Progress Notes (Signed)
Virtual Visit Progress Note  Shannon [name],you are scheduled for a virtual visit with your provider today.    Just as we do with appointments in the office, we must obtain your consent to participate.  Your consent will be active for this visit and any virtual visit you may have with one of our providers in the next 365 days.    If you have a MyChart account, I can also send a copy of this consent to you electronically.  All virtual visits are billed to your insurance company just like a traditional visit in the office.  As this is a virtual visit, video technology does not allow for your provider to perform a traditional examination.  This may limit your provider's ability to fully assess your condition.  If your provider identifies any concerns that need to be evaluated in person or the need to arrange testing such as labs, EKG, etc, we will make arrangements to do so.    Although advances in technology are sophisticated, we cannot ensure that it will always work on either your end or our end.  If the connection with a video visit is poor, we may have to switch to a telephone visit.  With either a video or telephone visit, we are not always able to ensure that we have a secure connection.   I need to obtain your verbal consent now.   Are you willing to proceed with your visit today?   Shannon [name] has provided verbal consent on 09/11/2020 for a virtual visit (video or telephone).   [name], NP 09/11/2020  12:51 PM    I connected with Shannon [name] on 09/11/20 at 12:45 PM EST by video enabled telemedicine visit and verified that I am speaking with the correct person using two identifiers.   I discussed the limitations, risks, security and privacy concerns of performing an evaluation and management service by telemedicine and the availability of in-person appointments. I also discussed with the patient that there may be a patient responsible charge related to this service. The patient  expressed understanding and agreed to proceed.   Other persons participating in the visit and their role in the encounter: none  Patient's location: home Provider's location: home  Chief Complaint: UTI    Patient Care Team: Leone Haven, MD as PCP - General (Family Medicine)   Name of the patient: Shannon [name]  [id number]  1950/10/14   Date of visit: 09/11/20  History of Presenting Illness- Shannon [name] presents with: burning with urination, frequency, hesitancy and urgency  She has had symptoms for 1 day.  She also complains of none.  She denies back pain, fever, stomach ache and vaginal discharge.  She does not have a history of recurrent UTI.   She does not have a history of pyelonephritis.    Review of systems- Review of Systems  Constitutional: Negative.   HENT: Negative.   Eyes: Negative.   Respiratory: Negative.   Cardiovascular: Negative.   Gastrointestinal: Negative.   Genitourinary: Negative.        Per hpi  Musculoskeletal: Negative.   Skin: Negative.   Neurological: Negative.   Endo/Heme/Allergies: Negative.   Psychiatric/Behavioral: Negative.      No Known Allergies  Past Medical History:  Diagnosis Date  . GERD (gastroesophageal reflux disease)   . Hyperlipidemia   . Osteoporosis    now has osteopenia  . Thyroid disease     Past Surgical History:  Procedure Laterality  Date  . COLOSTOMY  2016  . HAND SURGERY     40 yrs ago    Social History   Socioeconomic History  . Marital status: Married    Spouse name: Not on file  . Number of children: Not on file  . Years of education: Not on file  . Highest education level: Not on file  Occupational History  . Not on file  Tobacco Use  . Smoking status: Former Smoker    Types: Cigarettes    Quit date: 01/18/1989    Years since quitting: 31.6  . Smokeless tobacco: Never Used  Vaping Use  . Vaping Use: Never used  Substance and Sexual Activity  . Alcohol use: Yes    Alcohol/week:  7.0 standard drinks    Types: 7 Glasses of wine per week  . Drug use: No  . Sexual activity: Yes    Partners: Male  Other Topics Concern  . Not on file  Social History Narrative   Lives in [place] with husband. 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 Dog in home. Born in [place], raised in [place]. [place]. Previously lived in [place].      Diet - regular diet      Exercise - walking   Caffeine: coffee in the morning; cup of hot tea      Hobbies - yardwork      Work - [employer] in [place]   Social Determinants of [employer] Strain: Low Risk   . Difficulty of Paying Living Expenses: Not hard at all  Food Insecurity: No Food Insecurity  . Worried About Charity fundraiser in the Last Year: Never true  . Ran Out of Food in the Last Year: Never true  Transportation Needs: No Transportation Needs  . Lack of Transportation (Medical): No  . Lack of Transportation (Non-Medical): No  Physical Activity: Not on file  Stress: No Stress Concern Present  . Feeling of Stress : Only a little  Social Connections: Unknown  . Frequency of Communication with Friends and Family: Not on file  . Frequency of Social Gatherings with Friends and Family: Not on file  . Attends Religious Services: Not on file  . Active Member of Clubs or Organizations: Not on file  . Attends Archivist Meetings: Not on file  . Marital Status: Married  Human resources officer Violence: Not At Risk  . Fear of Current or Ex-Partner: No  . Emotionally Abused: No  . Physically Abused: No  . Sexually Abused: No    Immunization History  Administered Date(s) Administered  . Influenza, High Dose Seasonal PF 05/03/2018  . Influenza,inj,Quad PF,6+ Mos 05/20/2013, 06/02/2014  . Influenza-Unspecified 05/20/2016, 06/28/2017, 04/17/2019  . PFIZER(Purple Top)SARS-COV-2 Vaccination 08/12/2019, 09/02/2019, 03/18/2020  . Pneumococcal Conjugate-13 09/10/2017  . Pneumococcal Polysaccharide-23 10/13/2018  . Tdap  05/21/2011  . Zoster 05/20/2012, 08/13/2012  . Zoster Recombinat (Shingrix) 09/10/2017, 06/06/2018    Family History  Problem Relation Age of Onset  . Breast cancer Mother 61  . Heart disease Father   . Cancer Father        Prostate  . Diabetes Father        diagnosed later in life  . Prostate cancer Father   . Colon polyps Father 38  . Parkinson's disease Father   . Congestive Heart Failure Father   . Asthma Brother   . Colon cancer Neg Hx   . Rectal cancer Neg Hx   . Stomach cancer Neg  Hx   . Esophageal cancer Neg Hx      Current Outpatient Medications:  .  augmented betamethasone dipropionate (DIPROLENE-AF) 0.05 % cream, , Disp: , Rfl:  .  b complex vitamins capsule, Take by mouth., Disp: , Rfl:  .  Cholecalciferol (D3 HIGH POTENCY) 125 MCG (5000 UT) capsule, , Disp: , Rfl:  .  Cholecalciferol (VITAMIN D3) 50 MCG (2000 UT) capsule, , Disp: , Rfl:  .  levothyroxine (SYNTHROID) 88 MCG tablet, Take 1 tablet (88 mcg total) by mouth daily., Disp: 90 tablet, Rfl: 3 .  Multiple Vitamins-Minerals (CENTRUM SILVER PO), Take 1 tablet by mouth daily., Disp: , Rfl:  .  neomycin-polymyxin b-dexamethasone (MAXITROL) 3.5-10000-0.1 SUSP, , Disp: , Rfl:  .  simvastatin (ZOCOR) 20 MG tablet, Take 1 tablet (20 mg total) by mouth every evening., Disp: 90 tablet, Rfl: 1  Physical exam: Exam limited due to telemedicine Physical Exam Constitutional:      General: She is not in acute distress. Neurological:     Mental Status: She is alert and oriented to person, place, and time.  Psychiatric:        Mood and Affect: Mood normal.        Behavior: Behavior normal.       Assessment and plan- Patient is a 70 y.o. female who presents for symptoms concerning for UTI. Start macrobid 100 mg twice daily x 7 days. Pyridium for pain x 3 days. Encouraged hydration. If symptoms worsen in the interim, return to clinic.   Visit Diagnosis 1. Urinary tract infection without hematuria, site unspecified      Patient expressed understanding and was in agreement with this plan. She also understands that She can call clinic at any time with any questions, concerns, or complaints.   I discussed the assessment and treatment plan with the patient. The patient was provided an opportunity to ask questions and all were answered. The patient agreed with the plan and demonstrated an understanding of the instructions.   The patient was advised to call back or seek an in-person evaluation if the symptoms worsen or if the condition fails to improve as anticipated.   I spent 15 minutes face-to-face video visit time dedicated to the care of this patient on the date of this encounter to include pre-visit review of allergies, PMH, medications, face-to-face time with the patient, and post visit ordering of testing/documentation.   Thank you for allowing me to participate in the care of this very pleasant patient.   Beckey Rutter, DNP, AGNP-C [facility]

## 2020-09-12 ENCOUNTER — Encounter: Payer: Self-pay | Admitting: Nurse Practitioner

## 2020-09-27 ENCOUNTER — Other Ambulatory Visit: Payer: Self-pay | Admitting: Internal Medicine

## 2020-09-30 ENCOUNTER — Ambulatory Visit: Payer: PPO | Admitting: Internal Medicine

## 2020-09-30 NOTE — Progress Notes (Deleted)
Patient ID: Shannon [name], female   DOB: [birth year], 70 y.o.   MRN: [id number]  This visit occurred during the SARS-CoV-2 public health emergency.  Safety protocols were in place, including screening questions prior to the visit, additional usage of staff PPE, and extensive cleaning of exam room while observing appropriate contact time as indicated for disinfecting solutions.   HPI f/u for  Shannon [name] is a 70 y.o.-year-old female, returning for f/u for MNG and hypothyroidism. Last visit 1 year ago.  Reviewed and addended history: Pt's PCP felt a right sided nodule at [facility] in 2014 >> Thyroid U/S (05/22/2013): heterogeneous gland, multinodular goiter with  - largest nodule in left lobe, 2.5 x 1.4 x 2.4 cm, solid, slightly echogenic, moderate internal vascularity, no calcifications.  - Largest nodule on the right: 1.4 x 0.9 x 1.0 cm, predominantly  isoechoic, with mild internal vascularity. No calcifications.   We biopsied both nodules (05/2013): The 2.5 cm nodule returned as FLUS, while the 1.4 cm nodule returned benign.  2016: A repeat biopsy of the left thyroid nodule returned again as FLUS. We perform molecular analysis with the [facility] test and this was negative for malignancy.  10/15/2019: Thyroid ultrasound showed stability of the nodules except for the left thyroid nodule, which has decreased in size (see Assessment section)  Pt denies: - feeling nodules in neck - hoarseness - dysphagia - choking - SOB with lying down  Hypothyroidism  - dx ~2003 >> started on levoxyl then >> but stopped subsequently as she did not feel different. She restarted LT4, on 25 mcg daily >>  now on 75 mcg since 07/2015.  She was initially not taking this correctly, now takes it as advised.  She is on levothyroxine 88 mcg daily: - in am - fasting - at least 30 min from b'fast - + calcium 600 mg daily -later in the day - no iron - + multivitamins -later in the day - no PPIs - stopped  Biotin  Reviewed her TFTs: Lab Results  Component Value Date   TSH 3.51 10/02/2019   TSH 2.48 09/22/2018   TSH 3.89 01/03/2018   TSH 6.51 (H) 10/09/2017   TSH 3.22 09/06/2017   TSH 2.71 10/08/2016   TSH 2.98 03/20/2016   TSH 6.15 (H) 01/23/2016   TSH 5.97 (H) 07/28/2015   TSH 5.45 (H) 02/22/2015   FREET4 1.15 10/02/2019   FREET4 0.94 09/22/2018   FREET4 1.01 01/03/2018   FREET4 0.84 10/09/2017   FREET4 1.33 09/06/2017   FREET4 0.90 10/08/2016   FREET4 0.89 03/20/2016   FREET4 0.97 01/23/2016   FREET4 0.76 07/28/2015   FREET4 0.78 06/02/2014    She has a history of osteoporosis-on alendronate, hyperlipidemia-on Zocor.  She is on Viviscal (biotin, low-dose calcium) for hair loss. On Nioxin shampoo.  Lab Results  Component Value Date   VD25OH 69.06 10/14/2019   VD25OH 69.78 09/22/2018   VD25OH 35.11 09/06/2017   VD25OH 39.36 06/20/2016   VD25OH 39 11/24/2013   VD25OH 51 05/20/2013   She is on 400 units vitamin D daily.  ROS: Constitutional: no weight gain/no weight loss, no fatigue, no subjective hyperthermia, no subjective hypothermia Eyes: no blurry vision, no xerophthalmia ENT: no sore throat, + see HPI Cardiovascular: no CP/no SOB/no palpitations/no leg swelling Respiratory: no cough/no SOB/no wheezing Gastrointestinal: no N/no V/no D/no C/no acid reflux Musculoskeletal: no muscle aches/no joint aches Skin: no rashes, + hair loss Neurological: no tremors/no numbness/no tingling/no dizziness  I reviewed pt's medications,  allergies, PMH, social hx, family hx, and changes were documented in the history of present illness. Otherwise, unchanged from my initial visit note.  Past Medical History:  Diagnosis Date  . GERD (gastroesophageal reflux disease)   . Hyperlipidemia   . Osteoporosis    now has osteopenia  . Thyroid disease    Past Surgical History:  Procedure Laterality Date  . COLOSTOMY  2016  . HAND SURGERY     40 yrs ago   History   Social  History  . Marital Status: Married    Spouse Name: N/A    Number of Children: 1   Occupational History  . retail.   Social History Main Topics  . Smoking status: Former Smoker    Types: Cigarettes    Quit date: 01/18/1989  . Smokeless tobacco: Never Used  . Alcohol Use: 4.2 oz/week    7 Glasses of wine per week  . Drug Use: No  . Sexual Activity: Yes    Partners: Male   Social History Narrative   Lives in [place] with husband. 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 Dog in home. Born in [place], raised in [place]. [place]. Previously lived in [place].      Diet - regular diet      Exercise - walking   Caffeine: coffee in the morning; cup of hot tea      Hobbies - yardwork      Work - [employer] in [place]   Current Outpatient Medications on File Prior to Visit  Medication Sig Dispense Refill  . augmented betamethasone dipropionate (DIPROLENE-AF) 0.05 % cream     . b complex vitamins capsule Take by mouth.    . Cholecalciferol (D3 HIGH POTENCY) 125 MCG (5000 UT) capsule     . Cholecalciferol (VITAMIN D3) 50 MCG (2000 UT) capsule     . levothyroxine (SYNTHROID) 88 MCG tablet TAKE ONE TABLET BY MOUTH DAILY 30 tablet 0  . Multiple Vitamins-Minerals (CENTRUM SILVER PO) Take 1 tablet by mouth daily.    Marland Kitchen neomycin-polymyxin b-dexamethasone (MAXITROL) 3.5-10000-0.1 SUSP     . simvastatin (ZOCOR) 20 MG tablet Take 1 tablet (20 mg total) by mouth every evening. 90 tablet 1   No current facility-administered medications on file prior to visit.   No Known Allergies Family History  Problem Relation Age of Onset  . Breast cancer Mother 5  . Heart disease Father   . Cancer Father        Prostate  . Diabetes Father        diagnosed later in life  . Prostate cancer Father   . Colon polyps Father 60  . Parkinson's disease Father   . Congestive Heart Failure Father   . Asthma Brother   . Colon cancer Neg Hx   . Rectal cancer Neg Hx   . Stomach cancer Neg Hx   . Esophageal  cancer Neg Hx    PE: There were no vitals taken for this visit. Wt Readings from Last 3 Encounters:  02/15/20 144 lb (65.3 kg)  12/01/19 144 lb 6.4 oz (65.5 kg)  11/17/19 144 lb 6.4 oz (65.5 kg)   Constitutional: normal weight, in NAD Eyes: PERRLA, EOMI, no exophthalmos ENT: moist mucous membranes, no thyromegaly or thyroid nodules felt on palpation of the, no cervical lymphadenopathy Cardiovascular: RRR, No MRG Respiratory: CTA B Gastrointestinal: abdomen soft, NT, ND, BS+ Musculoskeletal: no deformities, strength intact in all 4 Skin: moist, warm, no rashes Neurological: no tremor with outstretched  hands, DTR normal in all 4  ASSESSMENT: 1. MNG - Thyroid U/S (05/22/2013): heterogeneous gland, multinodular goiter - largest nodule in left lobe: 2.5 x 1.4 x 2.4 cm, solid, slightly echogenic, moderate internal vascularity, no calcifications.  - largest nodule on the right: 1.4 x 0.9 x 1.0 cm, predominantly  isoechoic, with mild internal vascularity, no calcifications.   - FNA both nodules (06/12/2013): Adequacy Reason Satisfactory For Evaluation. Diagnosis THYROID, FINE NEEDLE ASPIRATION LEFT, (SPECIMEN 1 OF 2, COLLECTED ON 06/11/2013). 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 Claudette Laws MD Pathologist, Electronic Signature (Case signed 06/12/2013) Specimen Clinical Information Nontoxic uninodular goiter, nodule, 2.5 x 1.4 x 2.4cm dominant left nodule   Adequacy Reason Satisfactory For Evaluation. Diagnosis THYROID, FINE NEEDLE ASPIRATION RIGHT, (SPECIMEN 2 OF 2, COLLECTED ON 11/20 2014). BENIGN. FINDINGS CONSISTENT WITH GOITER. FINDINGS CONSISTENT WITH THE CONTENTS OF A CYST. Claudette Laws MD Pathologist, Electronic Signature (Case signed 06/12/2013) Specimen Clinical Information Nontoxic uninodular goiter, Nodule, 1.4 x 0.9 x 1.0cm domnant right lobe thyroid nodule  The first nodule was characterized as consistent with a follicular neoplasm  and/or lesion. This was not an entity described in the Bethesda criteria. I was not sure how to interpret this. The treatment plan for a follicular lesion would be to repeat FNA, while for follicular neoplasm would be surgical lobectomy.  I discussed with the pathologist >> he suggested that we interpret this as FLUS.  Thyroid U/S (02/21/2015):  Right thyroid lobe Measurements: 5.0 cm x 1.5 cm x 1.8 cm. Heterogeneous thyroid with relatively increased flow.  - Superior nodule measures 7 mm x 6 mm x 8 mm. - Previously biopsied lower right thyroid nodule measures 1.2 cm x 1.0 cm x 1.3 cm. Nodule is solid with internal reflectors.  Left thyroid lobe Measurements: 6.3 cm x 2.0 cm x 1.9 cm.  Heterogeneous left thyroid with relatively increased flow. - Nodule at the inferior aspect has been previously biopsied and measures 2.9 cm x 1.8 cm x 2.8 cm.  Isthmus Thickness: 3 mm. No nodules visualized.  Lymphadenopathy: None visualized.  Left thyroid nodule has increased in size. Due to previous history of follicular lesion of undetermined significance, I suggested repeat biopsy with [facility] molecular testing.     03/03/2015: FNA: Adequacy Reason Satisfactory For Evaluation. 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 COMMENT: THE SPECIMEN IS HYPERCELLULAR AND CONSISTS OF SMALL TO LARGE GROUPS OF FOLLICULAR EPITHELIAL CELLS WITH FOCAL HURTHLE CELL CHANGES. THERE IS MILD CYTOLOGIC ATYPIA, INCLUDING INTRANUCLEAR GROOVES. BASED ON THESE FEATURES, A FOLLICULAR LESION/NEOPLASM CAN NOT BE ENTIRELY RULED OUT. Specimen Clinical Information Nodule at the inferior aspect has been previously biopsied and measures 2.9 cm x 1.8 cm x 2.8 cm Source Thyroid, Fine Needle Aspiration, Left Lobe, (Specimen 1 of 1, collected on 03/02/15 )  AFIRMA test  results were benign.  Thyroid U/S (10/15/2019): Parenchymal Echotexture: Moderately heterogenous Isthmus: 0.4 cm Right lobe: 4.7 x 1.4 x 1.4 cm Left lobe: 5.8 x 1.4 x 1.7 cm _________________________________________________________  Nodule # 1: Prior biopsy: No Location: Right; Superior Maximum size: 1.0 cm; Other 2 dimensions: 0.9 x 0.6 cm, previously, 0.8 cm Composition: solid/almost completely solid (2) Echogenicity: isoechoic (1) Change in features: Yes Given size (<1.4 cm) and appearance, this nodule does NOT meet TI-RADS criteria for biopsy or dedicated follow-up. This nodule is barely visualized currently and may or may not still  be present. On the prior study, a clearly appeared spongiform and benign in appearance. ________________________________________________________  Nodule # 2: Prior biopsy: Yes Location: Right; Inferior Maximum size: 1.3 cm; Other 2 dimensions: 1.1 x 0.8 cm, previously, 1.3 cm Composition: solid/almost completely solid (2) Echogenicity: isoechoic (1) Given size (<1.4 cm) and appearance, this nodule does NOT meet TI-RADS criteria for biopsy or dedicated follow-up. This nodule appears stable. _________________________________________________________  Nodule # 3: Prior biopsy: Yes Location: Left; Inferior Maximum size: 2.4 cm; Other 2 dimensions: 2.2 x 1.5 cm, previously, 2.9 cm Composition: solid/almost completely solid (2) Echogenicity: isoechoic (1) This nodule shows similar morphology and does appear to be smaller in overall volume compared to the 2016 study. It clearly has not grown. ________________________________________________________  No abnormal lymph nodes identified.  IMPRESSION: 1. Residual nodule versus pseudo nodule in the superior right lobe measures 1 cm and does not meet criteria for biopsy or dedicated follow-up. 2. Stable 1.3 cm right inferior solid nodule which has been previously sampled. 3. The left  inferior thyroid nodule which has been previously sampled does appear to be smaller in volume and with similar morphology compared to the prior study in 2016. This nodule currently measures 2.4 x 1.5 x 2.2 cm compared to 2.9 x 1.8 x 2.8 cm Previously.  2. Hypothyroidism  3. Hair loss  PLAN: 1.  Multinodular goiter -No neck compression sxs -We obtained another thyroid ultrasound after last visit (10/15/2019):   The right superior nodule (versus pseudonodule) measures 1 cm and does not meet criteria for biopsy or dedicated follow-up  The right inferior nodule appears stable-this was previously biopsied with benign results  The dominant,  left inferior, thyroid nodule has decreased in size, which is reassuring.  It is now measuring 2.4 x 1.5 x 2.2 cm, decreased from 2.9 x 1.8 x 2.8 cm.  This nodule was biopsied in the past with inconclusive results (FLUS x2), however, the Afirma molecular marker was benign, so the risk of cancer is very low, between 1 to 2% -Since the nodules were stable or decreasing in size: 5 years, the risk of cancer is very low >> will repeat the U/S in ~5 years  2. Hypothyroidism - latest thyroid labs reviewed with pt >> normal: Lab Results  Component Value Date   TSH 3.51 10/02/2019   - she continues on LT4 88 mcg daily - pt feels good on this dose. - we discussed about taking the thyroid hormone every day, with water, >30 minutes before breakfast, separated by >4 hours from acid reflux medications, calcium, iron, multivitamins. Pt. is taking it correctly. - will check thyroid tests today: TSH and fT4 - If labs are abnormal, she will need to return for repeat TFTs in 1.5 months - OTW, I will see her back in a year  Philemon Kingdom, MD PhD [facility] Endocrinology

## 2020-10-02 ENCOUNTER — Other Ambulatory Visit: Payer: Self-pay | Admitting: Internal Medicine

## 2020-10-14 ENCOUNTER — Encounter: Payer: PPO | Admitting: Family Medicine

## 2020-10-17 ENCOUNTER — Telehealth: Payer: Self-pay

## 2020-10-17 DIAGNOSIS — E785 Hyperlipidemia, unspecified: Secondary | ICD-10-CM

## 2020-10-17 DIAGNOSIS — E039 Hypothyroidism, unspecified: Secondary | ICD-10-CM

## 2020-10-17 NOTE — Telephone Encounter (Signed)
Labs ordered.

## 2020-10-17 NOTE — Telephone Encounter (Signed)
I called the patient and scheduled a lab appointment with her.  Kaladin Noseworthy,cma

## 2020-10-17 NOTE — Telephone Encounter (Signed)
Pt would like to get labs ordered prior to her cpe in April-please advise

## 2020-10-17 NOTE — Telephone Encounter (Signed)
Pt would like to get labs ordered prior to her cpe in April.  Nina,cma

## 2020-11-03 ENCOUNTER — Other Ambulatory Visit (INDEPENDENT_AMBULATORY_CARE_PROVIDER_SITE_OTHER): Payer: PPO

## 2020-11-03 ENCOUNTER — Other Ambulatory Visit: Payer: Self-pay

## 2020-11-03 DIAGNOSIS — E039 Hypothyroidism, unspecified: Secondary | ICD-10-CM | POA: Diagnosis not present

## 2020-11-03 DIAGNOSIS — E785 Hyperlipidemia, unspecified: Secondary | ICD-10-CM

## 2020-11-03 LAB — LIPID PANEL
Cholesterol: 160 mg/dL (ref 0–200)
HDL: 71.5 mg/dL (ref >39.00)
LDL Cholesterol: 74 mg/dL (ref 0–99)
NonHDL: 88.67
Total CHOL/HDL Ratio: 2
Triglycerides: 74 mg/dL (ref 0.0–149.0)
VLDL: 14.8 mg/dL (ref 0.0–40.0)

## 2020-11-03 LAB — COMPREHENSIVE METABOLIC PANEL
ALT: 28 U/L (ref 0–35)
AST: 28 U/L (ref 0–37)
Albumin: 4.1 g/dL (ref 3.5–5.2)
Alkaline Phosphatase: 44 U/L (ref 39–117)
BUN: 18 mg/dL (ref 6–23)
CO2: 27 mEq/L (ref 19–32)
Calcium: 9.4 mg/dL (ref 8.4–10.5)
Chloride: 102 mEq/L (ref 96–112)
Creatinine, Ser: 0.7 mg/dL (ref 0.40–1.20)
GFR: 88.05 mL/min (ref >60.00)
Glucose, Bld: 83 mg/dL (ref 70–99)
Potassium: 4.2 mEq/L (ref 3.5–5.1)
Sodium: 137 mEq/L (ref 135–145)
Total Bilirubin: 1 mg/dL (ref 0.2–1.2)
Total Protein: 6.8 g/dL (ref 6.0–8.3)

## 2020-11-03 LAB — T4, FREE: Free T4: 0.92 ng/dL (ref 0.60–1.60)

## 2020-11-03 LAB — TSH: TSH: 2.25 u[IU]/mL (ref 0.35–4.50)

## 2020-11-08 ENCOUNTER — Encounter: Payer: Self-pay | Admitting: Family Medicine

## 2020-11-08 ENCOUNTER — Ambulatory Visit (INDEPENDENT_AMBULATORY_CARE_PROVIDER_SITE_OTHER): Payer: PPO | Admitting: Family Medicine

## 2020-11-08 ENCOUNTER — Other Ambulatory Visit: Payer: Self-pay

## 2020-11-08 VITALS — BP 120/70 | HR 68 | Temp 96.4°F | Ht 66.0 in | Wt 139.4 lb

## 2020-11-08 DIAGNOSIS — Z1231 Encounter for screening mammogram for malignant neoplasm of breast: Secondary | ICD-10-CM | POA: Diagnosis not present

## 2020-11-08 DIAGNOSIS — N393 Stress incontinence (female) (male): Secondary | ICD-10-CM | POA: Insufficient documentation

## 2020-11-08 DIAGNOSIS — Z1283 Encounter for screening for malignant neoplasm of skin: Secondary | ICD-10-CM | POA: Diagnosis not present

## 2020-11-08 DIAGNOSIS — Z0001 Encounter for general adult medical examination with abnormal findings: Secondary | ICD-10-CM

## 2020-11-08 DIAGNOSIS — F439 Reaction to severe stress, unspecified: Secondary | ICD-10-CM | POA: Insufficient documentation

## 2020-11-08 NOTE — Assessment & Plan Note (Signed)
Offered support.  Advised her to monitor for any depression or anxiety and let us know if those occur.

## 2020-11-08 NOTE — Assessment & Plan Note (Addendum)
The patient requested a referral to a new dermatologist for her yearly full body skin exam.  Referral placed to Dr. Ricke Hey

## 2020-11-08 NOTE — Patient Instructions (Signed)
Nice to see you. Please do Kegel exercises to help with your stress incontinence. Please try to cut down on your alcohol intake. Please try to eat healthier and remain active.

## 2020-11-08 NOTE — Assessment & Plan Note (Signed)
Encouraged Kegel exercises. 

## 2020-11-08 NOTE — Assessment & Plan Note (Signed)
Physical exam completed.  Encouraged healthy diet and exercise.  Mammogram ordered and the patient knows to call to schedule in June.  Vaccines are up-to-date.  Bone density scan is up-to-date.  I encouraged her to decrease her alcohol intake.  Lab work has been reviewed with the patient.

## 2020-11-08 NOTE — Progress Notes (Signed)
Tommi Rumps, MD Phone: [phone]  Shannon [name] is a 70 y.o. female who presents today for CPE.  Diet: Notes this was not as healthy as she was traveling back and forth to [place] as her father was in the process of passing away.  Typically does try to get greens and fruits daily. Exercise: Some walking, pickleball Pap smear: Postmenopausal Colonoscopy: 11/21/2019 with 7-year recall Mammogram: 12/30/2019 negative Family history-  Colon cancer: no  Breast cancer: mother age 60  Ovarian cancer: no Menses: postmenopausal Vaccines-   Flu: UTD  Tetanus: UTD  Shingles: UTD  COVID19: UTD  Pneumonia: UTD HIV screening: aged out Hep C Screening: UTD Tobacco use: no  Alcohol use: 2/day Illicit Drug use: no Dentist: yes Ophthalmology: yes The patient does report some stress related to the loss of her father as well as building a house and moving her mother.  She feels like things will improve once she is through with the administrative stuff surrounding her parents house and her mother's move.  Stress incontinence: Patient notes she leaks a little bit of urine if she coughs or jumps on the trampoline and sometimes when she walks.  She wears a pad.  Active Ambulatory Problems    Diagnosis Date Noted  . Hyperlipidemia 05/20/2013  . Hypothyroidism 05/20/2013  . Osteoporosis 05/20/2013  . Thyroid nodule 05/20/2013  . Encounter for general adult medical examination with abnormal findings 11/24/2013  . Hair loss 06/02/2014  . Erythematous rash 10/15/2018  . Carpal tunnel syndrome 02/10/2013  . Eczema of scalp 04/03/2017  . Elevated TSH 02/10/2013  . Ganglion cyst of finger 09/24/2018  . Stress incontinence 11/08/2020  . Stress 11/08/2020  . Skin exam, screening for cancer 11/08/2020   Resolved Ambulatory Problems    Diagnosis Date Noted  . Need for prophylactic vaccination and inoculation against influenza 05/20/2013  . UTI (urinary tract infection) 11/09/2013  . Screening  for breast cancer 11/24/2013  . Encounter for general adult medical examination with abnormal findings 09/10/2017   Past Medical History:  Diagnosis Date  . GERD (gastroesophageal reflux disease)   . Thyroid disease     Family History  Problem Relation Age of Onset  . Breast cancer Mother 26  . Heart disease Father   . Cancer Father        Prostate  . Diabetes Father        diagnosed later in life  . Prostate cancer Father   . Colon polyps Father 12  . Parkinson's disease Father   . Congestive Heart Failure Father   . Asthma Brother   . Colon cancer Neg Hx   . Rectal cancer Neg Hx   . Stomach cancer Neg Hx   . Esophageal cancer Neg Hx     Social History   Socioeconomic History  . Marital status: Married    Spouse name: Not on file  . Number of children: Not on file  . Years of education: Not on file  . Highest education level: Not on file  Occupational History  . Not on file  Tobacco Use  . Smoking status: Former Smoker    Types: Cigarettes    Quit date: 01/18/1989    Years since quitting: 31.8  . Smokeless tobacco: Never Used  Vaping Use  . Vaping Use: Never used  Substance and Sexual Activity  . Alcohol use: Yes    Alcohol/week: 7.0 standard drinks    Types: 7 Glasses of wine per week  . Drug use: No  .  Sexual activity: Yes    Partners: Male  Other Topics Concern  . Not on file  Social History Narrative   Lives in [place] with husband. 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 Dog in home. Born in [place], raised in [place]. Previously lived in [place].      Diet - regular diet      Exercise - walking   Caffeine: coffee in the morning; cup of hot tea      Hobbies - yardwork      Work - [employer] in [place]   Social Determinants of [employer] Strain: Low Risk   . Difficulty of Paying Living Expenses: Not hard at all  Food Insecurity: No Food Insecurity  . Worried About Charity fundraiser in the Last Year: Never true  . Ran  Out of Food in the Last Year: Never true  Transportation Needs: No Transportation Needs  . Lack of Transportation (Medical): No  . Lack of Transportation (Non-Medical): No  Physical Activity: Not on file  Stress: No Stress Concern Present  . Feeling of Stress : Only a little  Social Connections: Unknown  . Frequency of Communication with Friends and Family: Not on file  . Frequency of Social Gatherings with Friends and Family: Not on file  . Attends Religious Services: Not on file  . Active Member of Clubs or Organizations: Not on file  . Attends Archivist Meetings: Not on file  . Marital Status: Married  Human resources officer Violence: Not At Risk  . Fear of Current or Ex-Partner: No  . Emotionally Abused: No  . Physically Abused: No  . Sexually Abused: No    ROS  General:  Negative for nexplained weight loss, fever Skin: Negative for new or changing mole, sore that won't heal HEENT: Negative for trouble hearing, trouble seeing, ringing in ears, mouth sores, hoarseness, change in voice, dysphagia. CV:  Negative for chest pain, dyspnea, edema, palpitations Resp: Negative for cough, dyspnea, hemoptysis GI: Negative for nausea, vomiting, diarrhea, constipation, abdominal pain, melena, hematochezia. GU: Positive for stress incontinence, negative for dysuria, urinary hesitance, hematuria, vaginal or penile discharge, polyuria, sexual difficulty, lumps in testicle or breasts MSK: Negative for muscle cramps or aches, joint pain or swelling Neuro: Negative for headaches, weakness, numbness, dizziness, passing out/fainting Psych: Negative for depression, anxiety, memory problems  Objective  Physical Exam Vitals:   11/08/20 1028  BP: 120/70  Pulse: 68  Temp: (!) 96.4 F (35.8 C)  SpO2: 99%    BP Readings from Last 3 Encounters:  11/08/20 120/70  12/01/19 115/69  10/14/19 120/70   Wt Readings from Last 3 Encounters:  11/08/20 139 lb 6.4 oz (63.2 kg)  02/15/20 144 lb  (65.3 kg)  12/01/19 144 lb 6.4 oz (65.5 kg)    Physical Exam Constitutional:      General: She is not in acute distress.    Appearance: She is not diaphoretic.  HENT:     Head: Normocephalic and atraumatic.  Cardiovascular:     Rate and Rhythm: Normal rate and regular rhythm.     Heart sounds: Normal heart sounds.  Pulmonary:     Effort: Pulmonary effort is normal.     Breath sounds: Normal breath sounds.  Abdominal:     General: Bowel sounds are normal. There is no distension.     Palpations: Abdomen is soft.     Tenderness: There is no abdominal tenderness. There is no guarding or rebound.  Genitourinary:    Comments: Needing eval CNA services chaperone, bilateral breast with no skin changes, nipple inversion, masses, or tenderness, no axillary masses bilaterally Lymphadenopathy:     Cervical: No cervical adenopathy.  Skin:    General: Skin is warm and dry.  Neurological:     Mental Status: She is alert.  Psychiatric:        Mood and Affect: Mood normal.      Assessment/Plan:   Problem List Items Addressed This Visit    Encounter for general adult medical examination with abnormal findings - Primary    Physical exam completed.  Encouraged healthy diet and exercise.  Mammogram ordered and the patient knows to call to schedule in June.  Vaccines are up-to-date.  Bone density scan is up-to-date.  I encouraged her to decrease her alcohol intake.  Lab work has been reviewed with the patient.      Stress incontinence    Encouraged Kegel exercises.      Stress    Offered support.  Advised her to monitor for any depression or anxiety and let us know if those occur.      Skin exam, screening for cancer    The patient requested a referral to a new dermatologist for her yearly full body skin exam.  Referral placed to Dr. Ricke Hey      Relevant Orders   Ambulatory referral to Dermatology    Other Visit Diagnoses    Encounter for screening mammogram for malignant  neoplasm of breast       Relevant Orders   MM 3D SCREEN BREAST BILATERAL      This visit occurred during the SARS-CoV-2 public health emergency.  Safety protocols were in place, including screening questions prior to the visit, additional usage of staff PPE, and extensive cleaning of exam room while observing appropriate contact time as indicated for disinfecting solutions.    Tommi Rumps, MD [place]

## 2020-11-18 ENCOUNTER — Telehealth: Payer: Self-pay | Admitting: Internal Medicine

## 2020-11-18 DIAGNOSIS — E039 Hypothyroidism, unspecified: Secondary | ICD-10-CM

## 2020-11-18 MED ORDER — LEVOTHYROXINE SODIUM 88 MCG PO TABS: 88.0000 ug | ORAL_TABLET | Freq: Every day | ORAL | 0 refills | Status: DC

## 2020-11-18 NOTE — Telephone Encounter (Signed)
Pt called to make an appt so that she could get her medication filled since she is about to go out of town.  MEDICATION: levothyroxine  PHARMACY:   [facility], [place] Phone:  [phone]  Fax:  [phone]      HAS THE PATIENT CONTACTED [facility]?  yes  IS THIS A 90 DAY SUPPLY :   IS PATIENT OUT OF MEDICATION: yes  IF NOT; HOW MUCH IS LEFT:   LAST APPOINTMENT DATE: @3 /13/2022  NEXT APPOINTMENT DATE:@6 /13/2022  DO WE HAVE YOUR PERMISSION TO LEAVE A DETAILED MESSAGE?:  OTHER COMMENTS:    **Let patient know to contact pharmacy at the end of the day to make sure medication is ready. **  ** Please notify patient to allow 48-72 hours to process**  **Encourage patient to contact the pharmacy for refills or they can request refills through [facility]**

## 2020-11-18 NOTE — Telephone Encounter (Signed)
Rx sent to preferred pharmacy.

## 2020-12-15 ENCOUNTER — Telehealth: Payer: PPO | Admitting: Orthopedic Surgery

## 2020-12-15 DIAGNOSIS — Z20822 Contact with and (suspected) exposure to covid-19: Secondary | ICD-10-CM | POA: Diagnosis not present

## 2020-12-15 DIAGNOSIS — J029 Acute pharyngitis, unspecified: Secondary | ICD-10-CM | POA: Diagnosis not present

## 2020-12-15 DIAGNOSIS — Z03818 Encounter for observation for suspected exposure to other biological agents ruled out: Secondary | ICD-10-CM | POA: Diagnosis not present

## 2020-12-15 MED ORDER — BENZONATATE 100 MG PO CAPS
100.0000 mg | ORAL_CAPSULE | Freq: Three times a day (TID) | ORAL | 0 refills | Status: DC | PRN
Start: 2020-12-15 — End: 2020-12-20

## 2020-12-15 NOTE — Progress Notes (Signed)
Based on what you shared with me, I feel your condition warrants further evaluation and I recommend that you be seen for a face to face visit.  Please contact your primary care physician practice to be seen. Many offices offer virtual options to be seen via video if you are not comfortable going in person to a medical facility at this time.  We are not allowed to prescribe medicines outside of the state we are licensed in.  If you do not have a PCP, [facility] offers a free physician referral service available at [phone]. Our trained staff has the experience, knowledge and resources to put you in touch with a physician who is right for you.   You also have the option of a video visit through [email or web address]  If you are having a true medical emergency please call 911.  NOTE: If you entered your credit card information for this eVisit, you will not be charged. You may see a "hold" on your card for the $35 but that hold will drop off and you will not have a charge processed.  Your e-visit answers were reviewed by a board certified advanced clinical practitioner to complete your personal care plan.  Thank you for using e-Visits.

## 2020-12-15 NOTE — Addendum Note (Signed)
Addended by: Silvestre Gunner on: 12/15/2020 08:19 PM   Modules accepted: Orders

## 2020-12-15 NOTE — Progress Notes (Signed)
E-Visit for Sore Throat  We are sorry that you are not feeling well.  Here is how we plan to help!  Your symptoms indicate a likely viral infection (Pharyngitis).   Pharyngitis is inflammation in the back of the throat which can cause a sore throat, scratchiness and sometimes difficulty swallowing.   Pharyngitis is typically caused by a respiratory virus and will just run its course.  Please keep in mind that your symptoms could last up to 10 days.  For throat pain, we recommend over the counter oral pain relief medications such as acetaminophen or aspirin, or anti-inflammatory medications such as ibuprofen or naproxen sodium.  Topical treatments such as oral throat lozenges or sprays may be used as needed.  Avoid close contact with loved ones, especially the very young and elderly.  Remember to wash your hands thoroughly throughout the day as this is the number one way to prevent the spread of infection and wipe down door knobs and counters with disinfectant.  After careful review of your answers, I would not recommend and antibiotic for your condition.  Antibiotics should not be used to treat conditions that we suspect are caused by viruses like the virus that causes the common cold or flu. However, some people can have Strep with atypical symptoms. You may need formal testing in clinic or office to confirm if your symptoms continue or worsen.  Providers prescribe antibiotics to treat infections caused by bacteria. Antibiotics are very powerful in treating bacterial infections when they are used properly.  To maintain their effectiveness, they should be used only when necessary.  Overuse of antibiotics has resulted in the development of super bugs that are resistant to treatment!    Home Care:  Only take medications as instructed by your medical team.  Do not drink alcohol while taking these medications.  A steam or ultrasonic humidifier can help congestion.  You can place a towel over your head  and breathe in the steam from hot water coming from a faucet.  Avoid close contacts especially the very young and the elderly.  Cover your mouth when you cough or sneeze.  Always remember to wash your hands.  Get Help Right Away If:  You develop worsening fever or throat pain.  You develop a severe head ache or visual changes.  Your symptoms persist after you have completed your treatment plan.  Make sure you  Understand these instructions.  Will watch your condition.  Will get help right away if you are not doing well or get worse.  Your e-visit answers were reviewed by a board certified advanced clinical practitioner to complete your personal care plan.  Depending on the condition, your plan could have included both over the counter or prescription medications.  If there is a problem please reply  once you have received a response from your provider.  Your safety is important to [place].  If you have drug allergies check your prescription carefully.    You can use MyChart to ask questions about todays visit, request a non-urgent call back, or ask for a work or school excuse for 24 hours related to this e-Visit. If it has been greater than 24 hours you will need to follow up with your provider, or enter a new e-Visit to address those concerns.  You will get an e-mail in the next two days asking about your experience.  I hope that your e-visit has been valuable and will speed your recovery. Thank you for using e-visits.  Greater than 5 minutes, yet less than 10 minutes of time have been spent researching, coordinating and implementing care for this patient today.   

## 2020-12-20 ENCOUNTER — Telehealth: Payer: PPO | Admitting: Physician Assistant

## 2020-12-20 DIAGNOSIS — J069 Acute upper respiratory infection, unspecified: Secondary | ICD-10-CM

## 2020-12-20 MED ORDER — AZITHROMYCIN 250 MG PO TABS: ORAL_TABLET | ORAL | 0 refills | Status: AC

## 2020-12-20 MED ORDER — BENZONATATE 100 MG PO CAPS: 100.0000 mg | ORAL_CAPSULE | Freq: Three times a day (TID) | ORAL | 0 refills | Status: DC | PRN

## 2020-12-20 NOTE — Progress Notes (Signed)
I have spent 5 minutes in review of e-visit questionnaire, review and updating patient chart, medical decision making and response to patient.   Joran Kallal Cody Adysson Revelle, PA-C    

## 2020-12-20 NOTE — Progress Notes (Signed)
We are sorry that you are not feeling well.  Here is how we plan to help!  Based on your presentation I would initially be concerned of (1) side effect from the booster as you can get cold/flu-like symptoms (mild to moderate) after vaccine, especially giving this was your fourth one, (2) bad luck of getting an upper respiratory infection right before getting your vaccine, and then getting the vaccine causing your immune system to be split and not as effective in handling your URI. Giving ongoing symptoms and negative COVID testing there is some concern that maybe this is a festering bacterial URI although viral infections can take 7-10 days to abate. I am going to have you continue the recommendations given previously but will add on an antibiotic to cover for bacterial causes. This has been sent to your pharmacy.    In addition you may use A prescription cough medication called Tessalon Perles 100mg . You may take 1-2 capsules every 8 hours as needed for your cough. Prescription also sent to pharmacy.    From your responses in the eVisit questionnaire you describe inflammation in the upper respiratory tract which is causing a significant cough.  This is commonly called Bronchitis and has four common causes:    Allergies  Viral Infections  Acid Reflux  Bacterial Infection Allergies, viruses and acid reflux are treated by controlling symptoms or eliminating the cause. An example might be a cough caused by taking certain blood pressure medications. You stop the cough by changing the medication. Another example might be a cough caused by acid reflux. Controlling the reflux helps control the cough.  USE OF BRONCHODILATOR ("RESCUE") INHALERS: There is a risk from using your bronchodilator too frequently.  The risk is that over-reliance on a medication which only relaxes the muscles surrounding the breathing tubes can reduce the effectiveness of medications prescribed to reduce swelling and congestion of  the tubes themselves.  Although you feel brief relief from the bronchodilator inhaler, your asthma may actually be worsening with the tubes becoming more swollen and filled with mucus.  This can delay other crucial treatments, such as oral steroid medications. If you need to use a bronchodilator inhaler daily, several times per day, you should discuss this with your provider.  There are probably better treatments that could be used to keep your asthma under control.     HOME CARE . Only take medications as instructed by your medical team. . Complete the entire course of an antibiotic. . Drink plenty of fluids and get plenty of rest. . Avoid close contacts especially the very young and the elderly . Cover your mouth if you cough or cough into your sleeve. . Always remember to wash your hands . A steam or ultrasonic humidifier can help congestion.   GET HELP RIGHT AWAY IF: . You develop worsening fever. . You become short of breath . You cough up blood. . Your symptoms persist after you have completed your treatment plan MAKE SURE YOU   Understand these instructions.  Will watch your condition.  Will get help right away if you are not doing well or get worse.  Your e-visit answers were reviewed by a board certified advanced clinical practitioner to complete your personal care plan.  Depending on the condition, your plan could have included both over the counter or prescription medications. If there is a problem please reply  once you have received a response from your provider. Your safety is important to [place].  If you have  drug allergies check your prescription carefully.    You can use MyChart to ask questions about today's visit, request a non-urgent call back, or ask for a work or school excuse for 24 hours related to this e-Visit. If it has been greater than 24 hours you will need to follow up with your provider, or enter a new e-Visit to address those concerns. You will get an e-mail  in the next two days asking about your experience.  I hope that your e-visit has been valuable and will speed your recovery. Thank you for using e-visits.

## 2020-12-29 ENCOUNTER — Other Ambulatory Visit: Payer: Self-pay | Admitting: Internal Medicine

## 2020-12-29 DIAGNOSIS — E039 Hypothyroidism, unspecified: Secondary | ICD-10-CM

## 2020-12-30 ENCOUNTER — Telehealth: Payer: Self-pay | Admitting: Family Medicine

## 2020-12-30 NOTE — Telephone Encounter (Signed)
Rejection Reason - Other - pt cancelled appt and has not r/s" Shannon [name] said on Dec 29, 2020 2:28 PM  Msg from [facility]

## 2020-12-30 NOTE — Telephone Encounter (Signed)
Pt has appt 6/13. Refill on hold until visit and labs

## 2021-01-02 ENCOUNTER — Ambulatory Visit (INDEPENDENT_AMBULATORY_CARE_PROVIDER_SITE_OTHER): Payer: PPO | Admitting: Internal Medicine

## 2021-01-02 ENCOUNTER — Encounter: Payer: Self-pay | Admitting: Internal Medicine

## 2021-01-02 ENCOUNTER — Other Ambulatory Visit: Payer: Self-pay

## 2021-01-02 VITALS — BP 128/72 | HR 63 | Ht 66.0 in | Wt 141.8 lb

## 2021-01-02 DIAGNOSIS — E041 Nontoxic single thyroid nodule: Secondary | ICD-10-CM | POA: Diagnosis not present

## 2021-01-02 DIAGNOSIS — E039 Hypothyroidism, unspecified: Secondary | ICD-10-CM

## 2021-01-02 MED ORDER — LEVOTHYROXINE SODIUM 88 MCG PO TABS: 88.0000 ug | ORAL_TABLET | Freq: Every day | ORAL | 3 refills | Status: DC

## 2021-01-02 NOTE — Progress Notes (Signed)
Patient ID: Shannon [name], female   DOB: 1951/05/27, 70 y.o.   MRN: [id number]  This visit occurred during the SARS-CoV-2 public health emergency.  Safety protocols were in place, including screening questions prior to the visit, additional usage of staff PPE, and extensive cleaning of exam room while observing appropriate contact time as indicated for disinfecting solutions.   HPI f/u for  Shannon [name] is a 70 y.o.-year-old female, returning for f/u for MNG and hypothyroidism. Last visit 1 year and 3 months ago.  Interim hx: She had Covid19 09/2020 (mild form).  This resolved completely.  She has no complaints at this visit that that then hair loss-chronic. Her father was sick >> went to [place] 12x in last years. He ended up dying. She will go on a [place] to [place] in 03/2020.  Multinodular goiter Reviewed and addended history: Pt's PCP felt a right sided nodule at [facility] in 2014  - Thyroid U/S (05/22/2013): heterogeneous gland, multinodular goiter - largest nodule in left lobe: 2.5 x 1.4 x 2.4 cm, solid, slightly echogenic, moderate internal vascularity, no calcifications.  - largest nodule on the right: 1.4 x 0.9 x 1.0 cm, predominantly  isoechoic, with mild internal vascularity, no calcifications.   - FNA [name] nodules (06/12/2013): Adequacy Reason Satisfactory For Evaluation. Diagnosis THYROID, FINE NEEDLE ASPIRATION LEFT, (SPECIMEN 1 OF 2, COLLECTED ON 06/11/2013). 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 Claudette Laws MD Pathologist, Electronic Signature (Case signed 06/12/2013) Specimen Clinical Information Nontoxic uninodular goiter, nodule, 2.5 x 1.4 x 2.4cm dominant left nodule   Adequacy Reason Satisfactory For Evaluation. Diagnosis THYROID, FINE NEEDLE ASPIRATION RIGHT, (SPECIMEN 2 OF 2, COLLECTED ON 11/20 2014). BENIGN. FINDINGS CONSISTENT WITH GOITER. FINDINGS CONSISTENT WITH THE CONTENTS OF A CYST. Claudette Laws MD Pathologist,  Electronic Signature (Case signed 06/12/2013) Specimen Clinical Information Nontoxic uninodular goiter, Nodule, 1.4 x 0.9 x 1.0cm domnant right lobe thyroid nodule  The first nodule was characterized as consistent with a follicular neoplasm and/or lesion. This was not an entity described in the Bethesda criteria. I was not sure how to interpret this. The treatment plan for a follicular lesion would be to repeat FNA, while for follicular neoplasm would be surgical lobectomy.  I discussed with the pathologist >> he suggested that we interpret this as FLUS.  Thyroid U/S (02/21/2015): Right thyroid lobe Measurements: 5.0 cm x 1.5 cm x 1.8 cm. Heterogeneous thyroid with relatively increased flow.  - Superior nodule measures 7 mm x 6 mm x 8 mm. - Previously biopsied lower right thyroid nodule measures 1.2 cm x 1.0 cm x 1.3 cm. Nodule is solid with internal reflectors. Left thyroid lobe Measurements: 6.3 cm x 2.0 cm x 1.9 cm.  Heterogeneous left thyroid with relatively increased flow. - Nodule at the inferior aspect has been previously biopsied and measures 2.9 cm x 1.8 cm x 2.8 cm. Isthmus Thickness: 3 mm.  No nodules visualized. Lymphadenopathy: None visualized.  Left thyroid nodule has increased in size. Due to previous history of follicular lesion of undetermined significance, I suggested repeat biopsy with [facility] molecular testing.     03/03/2015: FNA: Adequacy Reason Satisfactory For Evaluation. 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 COMMENT: THE SPECIMEN IS HYPERCELLULAR AND CONSISTS OF SMALL TO LARGE GROUPS OF FOLLICULAR EPITHELIAL CELLS WITH FOCAL HURTHLE CELL CHANGES. THERE IS MILD CYTOLOGIC ATYPIA, INCLUDING INTRANUCLEAR GROOVES. BASED ON THESE  FEATURES, A FOLLICULAR LESION/NEOPLASM CAN NOT BE ENTIRELY RULED  OUT. Specimen Clinical Information Nodule at the inferior aspect has been previously biopsied and measures 2.9 cm x 1.8 cm x 2.8 cm Source Thyroid, Fine Needle Aspiration, Left Lobe, (Specimen 1 of 1, collected on 03/02/15 )  AFIRMA test results were benign.  Thyroid U/S (10/15/2019): CLINICAL DATA:  Prior ultrasound follow-up. History of multinodular thyroid goiter and status post prior fine-needle aspiration of [name] right and left inferior nodules. The left inferior nodule has been sample twice including most recently on 03/02/2015. Cytology has demonstrated atypia undetermined significance (Bethesda 3).   THYROID ULTRASOUND Parenchymal Echotexture: Moderately heterogenous Isthmus: 0.4 cm Right lobe: 4.7 x 1.4 x 1.4 cm Left lobe: 5.8 x 1.4 x 1.7 cm _________________________________________________________   Nodule # 1: Prior biopsy: No Location: Right; Superior Maximum size: 1.0 cm; Other 2 dimensions: 0.9 x 0.6 cm, previously, 0.8 cm Composition: solid/almost completely solid (2) Echogenicity: isoechoic (1) Change in features: Yes Given size (<1.4 cm) and appearance, this nodule does NOT meet TI-RADS criteria for biopsy or dedicated follow-up. This nodule is barely visualized currently and may or may not still be present. On the prior study, a clearly appeared spongiform and benign in appearance.  ________________________________________________________   Nodule # 2: Prior biopsy: Yes Location: Right; Inferior Maximum size: 1.3 cm; Other 2 dimensions: 1.1 x 0.8 cm, previously, 1.3 cm Composition: solid/almost completely solid (2) Echogenicity: isoechoic (1) Given size (<1.4 cm) and appearance, this nodule does NOT meet TI-RADS criteria for biopsy or dedicated follow-up. This nodule appears stable. _________________________________________________________   Nodule # 3: Prior biopsy: Yes Location: Left; Inferior Maximum size: 2.4 cm; Other 2 dimensions: 2.2 x 1.5 cm,  previously, 2.9 cm Composition: solid/almost completely solid (2) Echogenicity: isoechoic (1)  This nodule shows similar morphology and does appear to be smaller in overall volume compared to the 2016 study. It clearly has not grown.  ________________________________________________________   No abnormal lymph nodes identified.   IMPRESSION: 1. Residual nodule versus pseudo nodule in the superior right lobe measures 1 cm and does not meet criteria for biopsy or dedicated follow-up. 2. Stable 1.3 cm right inferior solid nodule which has been previously sampled. 3. The left inferior thyroid nodule which has been previously sampled does appear to be smaller in volume and with similar morphology compared to the prior study in 2016. This nodule currently measures 2.4 x 1.5 x 2.2 cm compared to 2.9 x 1.8 x 2.8 cm previously.  Hypothyroidism  - dx ~2003 >> started on levoxyl then >> but stopped subsequently as she did not feel different. She restarted LT4, on 25 mcg daily and we then increased the dose gradually.  She was initially not taking this correctly, but she now takes it as advised.  Pt is now on levothyroxine 88 mcg daily, taken: - in am - fasting - at least 30-40 min from b'fast - no Fe, MVI, PPIs - + Calcium 600 mg daily, multivitamins - stopped Biotin (120 MCG)  Reviewed her TFTs: Lab Results  Component Value Date   TSH 2.25 11/03/2020   TSH 3.51 10/02/2019   TSH 2.48 09/22/2018   TSH 3.89 01/03/2018   TSH 6.51 (H) 10/09/2017   TSH 3.22 09/06/2017   TSH 2.71 10/08/2016   TSH 2.98 03/20/2016   TSH 6.15 (H) 01/23/2016   TSH 5.97 (H) 07/28/2015   FREET4 0.92 11/03/2020   FREET4 1.15 10/02/2019   FREET4 0.94 09/22/2018   FREET4 1.01 01/03/2018  FREET4 0.84 10/09/2017   FREET4 1.33 09/06/2017   FREET4 0.90 10/08/2016   FREET4 0.89 03/20/2016   FREET4 0.97 01/23/2016   FREET4 0.76 07/28/2015    She has a history of osteoporosis-on alendronate-when she  remembers, HL - on Zocor.  She is on Viviscal (biotin, low-dose calcium) -for hair loss. On Nioxin shampoo.  Lab Results  Component Value Date   VD25OH 69.06 10/14/2019   VD25OH 69.78 09/22/2018   VD25OH 35.11 09/06/2017   VD25OH 39.36 06/20/2016   VD25OH 39 11/24/2013   VD25OH 51 05/20/2013   She takes vitamin D 400 units daily.  ROS: Constitutional: no weight gain/no weight loss, no fatigue, no subjective hyperthermia, no subjective hypothermia Eyes: no blurry vision, no xerophthalmia ENT: no sore throat, no nodules palpated in neck, no dysphagia, no odynophagia, no hoarseness Cardiovascular: no CP/no SOB/no palpitations/no leg swelling Respiratory: no cough/no SOB/no wheezing Gastrointestinal: no N/no V/no D/no C/no acid reflux Musculoskeletal: no muscle aches/no joint aches Skin: no rashes, + hair loss Neurological: no tremors/no numbness/no tingling/no dizziness  I reviewed pt's medications, allergies, PMH, social hx, family hx, and changes were documented in the history of present illness. Otherwise, unchanged from my initial visit note.  Past Medical History:  Diagnosis Date   GERD (gastroesophageal reflux disease)    Hyperlipidemia    Osteoporosis    now has osteopenia   Thyroid disease    Past Surgical History:  Procedure Laterality Date   COLOSTOMY  2016   HAND SURGERY     40 yrs ago   History   Social History   Marital Status: Married    Spouse Name: N/A    Number of Children: 1   Occupational History   retail.   Social History Main Topics   Smoking status: Former Smoker    Types: Cigarettes    Quit date: 01/18/1989   Smokeless tobacco: Never Used   Alcohol Use: 4.2 oz/week    7 Glasses of wine per week   Drug Use: No   Sexual Activity: Yes    Partners: Male   Social History Narrative   Lives in [place] with husband. 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 Dog in home. Born in [place], raised in [place]. [place]. Previously lived in [place].       Diet - regular diet      Exercise - walking   Caffeine: coffee in the morning; cup of hot tea      Hobbies - yardwork      Work - [employer] in [place]   Current Outpatient Medications on File Prior to Visit  Medication Sig Dispense Refill   augmented betamethasone dipropionate (DIPROLENE-AF) 0.05 % cream      b complex vitamins capsule Take by mouth.     benzonatate (TESSALON) 100 MG capsule Take 1 capsule (100 mg total) by mouth 3 (three) times daily as needed for cough. 30 capsule 0   Cholecalciferol (D3 HIGH POTENCY) 125 MCG (5000 UT) capsule      Cholecalciferol (VITAMIN D3) 50 MCG (2000 UT) capsule      levothyroxine (SYNTHROID) 88 MCG tablet Take 1 tablet (88 mcg total) by mouth daily. 45 tablet 0   Multiple Vitamins-Minerals (CENTRUM SILVER PO) Take 1 tablet by mouth daily.     neomycin-polymyxin b-dexamethasone (MAXITROL) 3.5-10000-0.1 SUSP      simvastatin (ZOCOR) 20 MG tablet Take 1 tablet (20 mg total) by mouth every evening. 90 tablet 1   No current facility-administered medications on  file prior to visit.   No Known Allergies Family History  Problem Relation Age of Onset   Breast cancer Mother 84   Heart disease Father    Cancer Father        Prostate   Diabetes Father        diagnosed later in life   Prostate cancer Father    Colon polyps Father 61   Parkinson's disease Father    Congestive Heart Failure Father    Asthma Brother    Colon cancer Neg Hx    Rectal cancer Neg Hx    Stomach cancer Neg Hx    Esophageal cancer Neg Hx    PE: BP 128/72 (BP Location: Right Arm, Patient Position: Sitting, Cuff Size: Normal)   Pulse 63   Ht 5\' 6"  (1.676 m)   Wt 141 lb 12.8 oz (64.3 kg)   SpO2 91%   BMI 22.89 kg/m  Wt Readings from Last 3 Encounters:  01/02/21 141 lb 12.8 oz (64.3 kg)  11/08/20 139 lb 6.4 oz (63.2 kg)  02/15/20 144 lb (65.3 kg)   Constitutional: normal weight, in NAD Eyes: PERRLA, EOMI, no exophthalmos ENT: moist mucous membranes, no  thyromegaly, no cervical lymphadenopathy Cardiovascular: RRR, No MRG Respiratory: CTA B Gastrointestinal: abdomen soft, NT, ND, BS+ Musculoskeletal: no deformities, strength intact in all 4 Skin: moist, warm, no rashes Neurological: no tremor with outstretched hands, DTR normal in all 4  ASSESSMENT: 1. MNG  2. Hypothyroidism  PLAN: 1.  Multinodular goiter -She denies neck compression symptoms -At last visit we checked another thyroid ultrasound 5 years from the previous, and this showed that some nodules were stable or smaller than before -the dominant nodule is 2.4 x 2.2 x 1.5 cm isoechoic nodule with mild to moderate internal blood flow, more wide than tall.  This nodule was biopsied and the biopsy returned inconclusive (FLUS x2), however, a formal molecular marker returned benign, so the risk of cancer is very low.  This nodule was smaller on the latest ultrasound, previously 2.9 cm.  No need to repeat a biopsy.  The rest of the nodules are stable. -We will plan to repeat the thyroid ultrasound at next visit - I will see her back in 1 year  2. Hypothyroidism - latest thyroid labs reviewed with pt. >> normal: Lab Results  Component Value Date   TSH 2.25 11/03/2020  - she continues on LT4 88 mcg daily - pt feels good on this dose. - we discussed about taking the thyroid hormone every day, with water, >30 minutes before breakfast, separated by >4 hours from acid reflux medications, calcium, iron, multivitamins. Pt. is taking it correctly. - will check thyroid tests at next visit -I refilled her levothyroxine prescription  Philemon Kingdom, MD PhD [facility] Endocrinology

## 2021-01-02 NOTE — Patient Instructions (Addendum)
Please continue Levothyroxine 88 mcg daily.  Take the thyroid hormone every day, with water, at least 30 minutes before breakfast, separated by at least 4 hours from: - acid reflux medications - calcium - iron - multivitamins  Please return in 1 year.

## 2021-01-11 ENCOUNTER — Other Ambulatory Visit: Payer: Self-pay | Admitting: Family Medicine

## 2021-01-12 ENCOUNTER — Ambulatory Visit
Admission: RE | Admit: 2021-01-12 | Discharge: 2021-01-12 | Disposition: A | Discharge status: Home / Self Care | Payer: PPO | Source: Ambulatory Visit | Attending: Family Medicine | Admitting: Family Medicine

## 2021-01-12 ENCOUNTER — Other Ambulatory Visit: Payer: Self-pay

## 2021-01-12 DIAGNOSIS — Z1231 Encounter for screening mammogram for malignant neoplasm of breast: Secondary | ICD-10-CM | POA: Diagnosis not present

## 2021-02-15 ENCOUNTER — Ambulatory Visit: Payer: PPO

## 2021-02-21 DIAGNOSIS — L638 Other alopecia areata: Secondary | ICD-10-CM | POA: Diagnosis not present

## 2021-02-21 DIAGNOSIS — L570 Actinic keratosis: Secondary | ICD-10-CM | POA: Diagnosis not present

## 2021-02-21 DIAGNOSIS — L2389 Allergic contact dermatitis due to other agents: Secondary | ICD-10-CM | POA: Diagnosis not present

## 2021-02-21 DIAGNOSIS — L821 Other seborrheic keratosis: Secondary | ICD-10-CM | POA: Diagnosis not present

## 2021-02-21 DIAGNOSIS — L308 Other specified dermatitis: Secondary | ICD-10-CM | POA: Diagnosis not present

## 2021-02-21 DIAGNOSIS — Z86018 Personal history of other benign neoplasm: Secondary | ICD-10-CM | POA: Diagnosis not present

## 2021-02-21 DIAGNOSIS — L578 Other skin changes due to chronic exposure to nonionizing radiation: Secondary | ICD-10-CM | POA: Diagnosis not present

## 2021-05-09 ENCOUNTER — Ambulatory Visit: Payer: PPO

## 2021-05-19 ENCOUNTER — Telehealth: Payer: PPO | Admitting: Emergency Medicine

## 2021-05-19 ENCOUNTER — Encounter: Payer: Self-pay | Admitting: Family Medicine

## 2021-05-19 DIAGNOSIS — N3000 Acute cystitis without hematuria: Secondary | ICD-10-CM | POA: Diagnosis not present

## 2021-05-19 MED ORDER — ESTRADIOL 0.1 MG/GM VA CREA
TOPICAL_CREAM | VAGINAL | 12 refills | Status: DC
Start: 2021-05-19 — End: 2021-08-04

## 2021-05-19 MED ORDER — ONDANSETRON HCL 4 MG PO TABS: 4.0000 mg | ORAL_TABLET | Freq: Three times a day (TID) | ORAL | 0 refills | Status: DC | PRN

## 2021-05-19 MED ORDER — CEPHALEXIN 500 MG PO CAPS
500.0000 mg | ORAL_CAPSULE | Freq: Two times a day (BID) | ORAL | 0 refills | Status: AC
Start: 2021-05-19 — End: 2021-05-24

## 2021-05-19 NOTE — Progress Notes (Signed)
Virtual Visit Consent   Shannon [name], you are scheduled for a virtual visit with a [facility] provider today.     Just as with appointments in the office, your consent must be obtained to participate.  Your consent will be active for this visit and any virtual visit you may have with one of our providers in the next 365 days.     If you have a MyChart account, a copy of this consent can be sent to you electronically.  All virtual visits are billed to your insurance company just like a traditional visit in the office.    As this is a virtual visit, video technology does not allow for your provider to perform a traditional examination.  This may limit your provider's ability to fully assess your condition.  If your provider identifies any concerns that need to be evaluated in person or the need to arrange testing (such as labs, EKG, etc.), we will make arrangements to do so.     Although advances in technology are sophisticated, we cannot ensure that it will always work on either your end or our end.  If the connection with a video visit is poor, the visit may have to be switched to a telephone visit.  With either a video or telephone visit, we are not always able to ensure that we have a secure connection.     I need to obtain your verbal consent now.   Are you willing to proceed with your visit today?    ARLEATHA PHILIPPS has provided verbal consent on 05/19/2021 for a virtual visit (video or telephone).   Carvel Getting, NP   Date: 05/19/2021 11:31 AM   Virtual Visit via Video Note   I, Carvel Getting, connected with  Shannon [name]  ([id number], 1950-08-26) on 05/19/21 at  8:30 AM EDT by a video-enabled telemedicine application and verified that I am speaking with the correct person using two identifiers.  Location: Patient: Virtual Visit Location Patient: Home Provider: Virtual Visit Location Provider: Home Office   I discussed the limitations of evaluation and management by  telemedicine and the availability of in person appointments. The patient expressed understanding and agreed to proceed.    History of Present Illness: Shannon [name] is a 70 y.o. who identifies as a female who was assigned female at birth, and is being seen today for urinary tract infection.  Woke up this morning at 4 AM with burning with urination, chills but no fever.  Took 2 leftover Macrobid from last UTI last February and then took a Pyridium and vomited.  Does not have nausea now does not feel like she might vomit again.  Denies abdominal pain, flank pain.  Reports she gets UTIs every couple of years.  HPI: HPI  Problems:  Patient Active Problem List   Diagnosis Date Noted   Stress incontinence 11/08/2020   Stress 11/08/2020   Skin exam, screening for cancer 11/08/2020   Erythematous rash 10/15/2018   Ganglion cyst of finger 09/24/2018   Eczema of scalp 04/03/2017   Hair loss 06/02/2014   Encounter for general adult medical examination with abnormal findings 11/24/2013   Hyperlipidemia 05/20/2013   Hypothyroidism 05/20/2013   Osteoporosis 05/20/2013   Thyroid nodule 05/20/2013   Carpal tunnel syndrome 02/10/2013   Elevated TSH 02/10/2013    Allergies: No Known Allergies Medications:  Current Outpatient Medications:    cephALEXin (KEFLEX) 500 MG capsule, Take 1 capsule (500 mg total) by mouth  2 (two) times daily for 5 days., Disp: 10 capsule, Rfl: 0   estradiol (ESTRACE) 0.1 MG/GM vaginal cream, 1 gram intravaginally at bedtime daily for 2-3 weeks, then decrease to 3 times per week, Disp: 42.5 g, Rfl: 12   ondansetron (ZOFRAN) 4 MG tablet, Take 1 tablet (4 mg total) by mouth every 8 (eight) hours as needed for nausea or vomiting., Disp: 12 tablet, Rfl: 0   augmented betamethasone dipropionate (DIPROLENE-AF) 0.05 % cream, , Disp: , Rfl:    b complex vitamins capsule, Take by mouth., Disp: , Rfl:    benzonatate (TESSALON) 100 MG capsule, Take 1 capsule (100 mg total) by mouth 3  (three) times daily as needed for cough., Disp: 30 capsule, Rfl: 0   Cholecalciferol (D3 HIGH POTENCY) 125 MCG (5000 UT) capsule, , Disp: , Rfl:    Cholecalciferol (VITAMIN D3) 50 MCG (2000 UT) capsule, , Disp: , Rfl:    levothyroxine (SYNTHROID) 88 MCG tablet, Take 1 tablet (88 mcg total) by mouth daily., Disp: 90 tablet, Rfl: 3   Multiple Vitamins-Minerals (CENTRUM SILVER PO), Take 1 tablet by mouth daily., Disp: , Rfl:    neomycin-polymyxin b-dexamethasone (MAXITROL) 3.5-10000-0.1 SUSP, , Disp: , Rfl:    simvastatin (ZOCOR) 20 MG tablet, TAKE ONE TABLET BY MOUTH EVERY EVENING, Disp: 90 tablet, Rfl: 1  Observations/Objective: Patient is well-developed, well-nourished in no acute distress.  Resting comfortably  at home.  Head is normocephalic, atraumatic.  No labored breathing.  Speech is clear and coherent with logical content.  Patient is alert and oriented at baseline.    Assessment and Plan: 1. Acute cystitis without hematuria  Discussed with patient the use of vaginal estrogen cream in patients with recurrent UTIs.  Patient is interested in trying it.  So I prescribed estradiol cream intravaginally daily for 2 to 3 weeks and then she can switch to 3 times a week after that if symptoms have improved.  I also prescribed Keflex for her UTI and Zofran for nausea in case she needs it.  Reviewed reasons for seeking higher level of care.  Follow Up Instructions: I discussed the assessment and treatment plan with the patient. The patient was provided an opportunity to ask questions and all were answered. The patient agreed with the plan and demonstrated an understanding of the instructions.  A copy of instructions were sent to the patient via MyChart unless otherwise noted below.   The patient was advised to call back or seek an in-person evaluation if the symptoms worsen or if the condition fails to improve as anticipated.  Time:  I spent 10 minutes with the patient via telehealth  technology discussing the above problems/concerns.    Carvel Getting, NP

## 2021-05-19 NOTE — Patient Instructions (Signed)
Shannon [name], thank you for joining Carvel Getting, NP for today's virtual visit.  While this provider is not your primary care provider (PCP), if your PCP is located in our provider database this encounter information will be shared with them immediately following your visit.  Consent: (Patient) Shannon [name] provided verbal consent for this virtual visit at the beginning of the encounter.  Current Medications:  Current Outpatient Medications:    cephALEXin (KEFLEX) 500 MG capsule, Take 1 capsule (500 mg total) by mouth 2 (two) times daily for 5 days., Disp: 10 capsule, Rfl: 0   estradiol (ESTRACE) 0.1 MG/GM vaginal cream, 1 gram intravaginally at bedtime daily for 2-3 weeks, then decrease to 3 times per week, Disp: 42.5 g, Rfl: 12   ondansetron (ZOFRAN) 4 MG tablet, Take 1 tablet (4 mg total) by mouth every 8 (eight) hours as needed for nausea or vomiting., Disp: 12 tablet, Rfl: 0   augmented betamethasone dipropionate (DIPROLENE-AF) 0.05 % cream, , Disp: , Rfl:    b complex vitamins capsule, Take by mouth., Disp: , Rfl:    benzonatate (TESSALON) 100 MG capsule, Take 1 capsule (100 mg total) by mouth 3 (three) times daily as needed for cough., Disp: 30 capsule, Rfl: 0   Cholecalciferol (D3 HIGH POTENCY) 125 MCG (5000 UT) capsule, , Disp: , Rfl:    Cholecalciferol (VITAMIN D3) 50 MCG (2000 UT) capsule, , Disp: , Rfl:    levothyroxine (SYNTHROID) 88 MCG tablet, Take 1 tablet (88 mcg total) by mouth daily., Disp: 90 tablet, Rfl: 3   Multiple Vitamins-Minerals (CENTRUM SILVER PO), Take 1 tablet by mouth daily., Disp: , Rfl:    neomycin-polymyxin b-dexamethasone (MAXITROL) 3.5-10000-0.1 SUSP, , Disp: , Rfl:    simvastatin (ZOCOR) 20 MG tablet, TAKE ONE TABLET BY MOUTH EVERY EVENING, Disp: 90 tablet, Rfl: 1   Medications ordered in this encounter:  Meds ordered this encounter  Medications   cephALEXin (KEFLEX) 500 MG capsule    Sig: Take 1 capsule (500 mg total) by mouth 2 (two) times  daily for 5 days.    Dispense:  10 capsule    Refill:  0   ondansetron (ZOFRAN) 4 MG tablet    Sig: Take 1 tablet (4 mg total) by mouth every 8 (eight) hours as needed for nausea or vomiting.    Dispense:  12 tablet    Refill:  0   estradiol (ESTRACE) 0.1 MG/GM vaginal cream    Sig: 1 gram intravaginally at bedtime daily for 2-3 weeks, then decrease to 3 times per week    Dispense:  42.5 g    Refill:  12     *If you need refills on other medications prior to your next appointment, please contact your pharmacy*  Follow-Up: Call back or seek an in-person evaluation if the symptoms worsen or if the condition fails to improve as anticipated.  Other Instructions Drink plenty of liquids and stay hydrated.  If you develop fever, abdominal pain, or vomiting not relieved by the nausea medicine prescribed, you will need to seek care in an urgent care or emergency room.   If you have been instructed to have an in-person evaluation today at a local Urgent Care facility, please use the link below. It will take you to a list of all of our available [facility], including address, phone number and hours of operation. Please do not delay care.  [facility]  If you or a family member do not  have a primary care provider, use the link below to schedule a visit and establish care. When you choose a [facility] primary care physician or advanced practice provider, you gain a long-term partner in health. Find a Primary Care Provider  Learn more about [facility]'s in-office and virtual care options: [facility]

## 2021-05-28 ENCOUNTER — Telehealth: Payer: PPO | Admitting: Family

## 2021-05-28 DIAGNOSIS — N390 Urinary tract infection, site not specified: Secondary | ICD-10-CM

## 2021-05-28 NOTE — Progress Notes (Signed)
Based on what you shared with me, I feel your condition warrants further evaluation and I recommend that you be seen in a face to face visit.  Given your symptoms have return, you need to be seen in person for a urine culture.    NOTE: There will be NO CHARGE for this eVisit   If you are having a true medical emergency please call 911.      For an urgent face to face visit, [facility] has six urgent care centers for your convenience:     [facility] [place] Get Driving Directions [phone] [place], [place] [address]    [facility]) Get Driving Directions [phone] [place], [place] [address]  [facility] [place] [facility]) Get Driving Directions [phone] [address] [place],  [place]  [address]  [facility] Get Driving Directions [phone] [place], [place], [place] [address]   [facility] Get Driving Directions  [phone] [address] [place], [place] [address]   [facility] [place] Get Driving Directions [phone] [address] [place], [place] [address]  Your MyChart E-visit questionnaire answers were reviewed by a board certified advanced clinical practitioner to complete your personal care plan based on your specific symptoms.  Thank you for using e-Visits.

## 2021-05-29 ENCOUNTER — Encounter: Payer: Self-pay | Admitting: Family Medicine

## 2021-05-29 MED ORDER — NITROFURANTOIN MONOHYD MACRO 100 MG PO CAPS
100.0000 mg | ORAL_CAPSULE | Freq: Two times a day (BID) | ORAL | 0 refills | Status: DC
Start: 2021-05-29 — End: 2021-07-01

## 2021-06-13 ENCOUNTER — Telehealth: Payer: Self-pay | Admitting: Family Medicine

## 2021-06-13 NOTE — Telephone Encounter (Signed)
Copied from [place] [id number]. Topic: Medicare AWV >> Jun 13, 2021 10:24 AM Harris-Coley, Hannah Beat wrote: Reason for CRM: Left message 06/13/21 for patient to reschedule Annual Wellness Visit.  Please schedule with Nurse Health Advisor Denisa O'Brien-Blaney, LPN at [facility].  Please call [phone] ask for [facility]

## 2021-06-14 ENCOUNTER — Ambulatory Visit: Payer: PPO

## 2021-07-01 ENCOUNTER — Telehealth: Payer: PPO | Admitting: Nurse Practitioner

## 2021-07-01 ENCOUNTER — Encounter: Payer: Self-pay | Admitting: Family Medicine

## 2021-07-01 DIAGNOSIS — R399 Unspecified symptoms and signs involving the genitourinary system: Secondary | ICD-10-CM | POA: Diagnosis not present

## 2021-07-01 MED ORDER — CEPHALEXIN 500 MG PO CAPS: 500.0000 mg | ORAL_CAPSULE | Freq: Two times a day (BID) | ORAL | 0 refills | Status: AC

## 2021-07-01 NOTE — Progress Notes (Signed)
Virtual Visit Consent   Shannon [name], you are scheduled for a virtual visit with a [facility] provider today.     Just as with appointments in the office, your consent must be obtained to participate.  Your consent will be active for this visit and any virtual visit you may have with one of our providers in the next 365 days.     If you have a MyChart account, a copy of this consent can be sent to you electronically.  All virtual visits are billed to your insurance company just like a traditional visit in the office.    As this is a virtual visit, video technology does not allow for your provider to perform a traditional examination.  This may limit your provider's ability to fully assess your condition.  If your provider identifies any concerns that need to be evaluated in person or the need to arrange testing (such as labs, EKG, etc.), we will make arrangements to do so.     Although advances in technology are sophisticated, we cannot ensure that it will always work on either your end or our end.  If the connection with a video visit is poor, the visit may have to be switched to a telephone visit.  With either a video or telephone visit, we are not always able to ensure that we have a secure connection.     I need to obtain your verbal consent now.   Are you willing to proceed with your visit today?    TIA HIERONYMUS has provided verbal consent on 07/01/2021 for a virtual visit (video or telephone).   Gildardo Pounds, NP   Date: 07/01/2021 2:39 PM   Virtual Visit via Video Note   I, Gildardo Pounds, connected with  Shannon [name]  ([id number], 04/09/1951) on 07/01/21 at  2:45 PM EST by a video-enabled telemedicine application and verified that I am speaking with the correct person using two identifiers.  Location: Patient: Virtual Visit Location Patient: Home Provider: Virtual Visit Location Provider: Home Office   I discussed the limitations of evaluation and management by  telemedicine and the availability of in person appointments. The patient expressed understanding and agreed to proceed.    History of Present Illness: Shannon [name] is a 70 y.o. who identifies as a female who was assigned female at birth, and is being seen today for recurrent UTI Symptoms.  HPI:  Notes recurrent symptoms of UTI: dysuria, hesitancy. She has taken AZO with no relief of symptoms. Recently treated with  macrobid last month for UTI symptoms.  Denies fever, chills, hematuria or bilateral flank pain    Problems:  Patient Active Problem List   Diagnosis Date Noted   Stress incontinence 11/08/2020   Stress 11/08/2020   Skin exam, screening for cancer 11/08/2020   Erythematous rash 10/15/2018   Ganglion cyst of finger 09/24/2018   Eczema of scalp 04/03/2017   Hair loss 06/02/2014   Encounter for general adult medical examination with abnormal findings 11/24/2013   Hyperlipidemia 05/20/2013   Hypothyroidism 05/20/2013   Osteoporosis 05/20/2013   Thyroid nodule 05/20/2013   Carpal tunnel syndrome 02/10/2013   Elevated TSH 02/10/2013    Allergies: No Known Allergies Medications:  Current Outpatient Medications:    cephALEXin (KEFLEX) 500 MG capsule, Take 1 capsule (500 mg total) by mouth 2 (two) times daily for 7 days., Disp: 14 capsule, Rfl: 0   augmented betamethasone dipropionate (DIPROLENE-AF) 0.05 % cream, , Disp: , Rfl:  b complex vitamins capsule, Take by mouth., Disp: , Rfl:    benzonatate (TESSALON) 100 MG capsule, Take 1 capsule (100 mg total) by mouth 3 (three) times daily as needed for cough., Disp: 30 capsule, Rfl: 0   Cholecalciferol (D3 HIGH POTENCY) 125 MCG (5000 UT) capsule, , Disp: , Rfl:    Cholecalciferol (VITAMIN D3) 50 MCG (2000 UT) capsule, , Disp: , Rfl:    estradiol (ESTRACE) 0.1 MG/GM vaginal cream, 1 gram intravaginally at bedtime daily for 2-3 weeks, then decrease to 3 times per week, Disp: 42.5 g, Rfl: 12   levothyroxine (SYNTHROID) 88 MCG  tablet, Take 1 tablet (88 mcg total) by mouth daily., Disp: 90 tablet, Rfl: 3   Multiple Vitamins-Minerals (CENTRUM SILVER PO), Take 1 tablet by mouth daily., Disp: , Rfl:    neomycin-polymyxin b-dexamethasone (MAXITROL) 3.5-10000-0.1 SUSP, , Disp: , Rfl:    ondansetron (ZOFRAN) 4 MG tablet, Take 1 tablet (4 mg total) by mouth every 8 (eight) hours as needed for nausea or vomiting., Disp: 12 tablet, Rfl: 0   simvastatin (ZOCOR) 20 MG tablet, TAKE ONE TABLET BY MOUTH EVERY EVENING, Disp: 90 tablet, Rfl: 1  Observations/Objective: Patient is well-developed, well-nourished in no acute distress.  Resting comfortably  at home.  Head is normocephalic, atraumatic.  No labored breathing.  Speech is clear and coherent with logical content.  Patient is alert and oriented at baseline.    Assessment and Plan: 1. UTI symptoms - cephALEXin (KEFLEX) 500 MG capsule; Take 1 capsule (500 mg total) by mouth 2 (two) times daily for 7 days.  Dispense: 14 capsule; Refill: 0   Follow Up Instructions: I discussed the assessment and treatment plan with the patient. The patient was provided an opportunity to ask questions and all were answered. The patient agreed with the plan and demonstrated an understanding of the instructions.  A copy of instructions were sent to the patient via MyChart unless otherwise noted below.    The patient was advised to call back or seek an in-person evaluation if the symptoms worsen or if the condition fails to improve as anticipated.  Time:  I spent 10 minutes with the patient via telehealth technology discussing the above problems/concerns.    Gildardo Pounds, NP

## 2021-07-01 NOTE — Patient Instructions (Signed)
  Shannon [name], thank you for joining Gildardo Pounds, NP for today's virtual visit.  While this provider is not your primary care provider (PCP), if your PCP is located in our provider database this encounter information will be shared with them immediately following your visit.  Consent: (Patient) Shannon [name] provided verbal consent for this virtual visit at the beginning of the encounter.  Current Medications:  Current Outpatient Medications:    cephALEXin (KEFLEX) 500 MG capsule, Take 1 capsule (500 mg total) by mouth 2 (two) times daily for 7 days., Disp: 14 capsule, Rfl: 0   augmented betamethasone dipropionate (DIPROLENE-AF) 0.05 % cream, , Disp: , Rfl:    b complex vitamins capsule, Take by mouth., Disp: , Rfl:    benzonatate (TESSALON) 100 MG capsule, Take 1 capsule (100 mg total) by mouth 3 (three) times daily as needed for cough., Disp: 30 capsule, Rfl: 0   Cholecalciferol (D3 HIGH POTENCY) 125 MCG (5000 UT) capsule, , Disp: , Rfl:    Cholecalciferol (VITAMIN D3) 50 MCG (2000 UT) capsule, , Disp: , Rfl:    estradiol (ESTRACE) 0.1 MG/GM vaginal cream, 1 gram intravaginally at bedtime daily for 2-3 weeks, then decrease to 3 times per week, Disp: 42.5 g, Rfl: 12   levothyroxine (SYNTHROID) 88 MCG tablet, Take 1 tablet (88 mcg total) by mouth daily., Disp: 90 tablet, Rfl: 3   Multiple Vitamins-Minerals (CENTRUM SILVER PO), Take 1 tablet by mouth daily., Disp: , Rfl:    neomycin-polymyxin b-dexamethasone (MAXITROL) 3.5-10000-0.1 SUSP, , Disp: , Rfl:    ondansetron (ZOFRAN) 4 MG tablet, Take 1 tablet (4 mg total) by mouth every 8 (eight) hours as needed for nausea or vomiting., Disp: 12 tablet, Rfl: 0   simvastatin (ZOCOR) 20 MG tablet, TAKE ONE TABLET BY MOUTH EVERY EVENING, Disp: 90 tablet, Rfl: 1   Medications ordered in this encounter:  Meds ordered this encounter  Medications   cephALEXin (KEFLEX) 500 MG capsule    Sig: Take 1 capsule (500 mg total) by mouth 2 (two) times  daily for 7 days.    Dispense:  14 capsule    Refill:  0    Order Specific Question:   Supervising Provider    Answer:   Sabra Heck, [place]     *If you need refills on other medications prior to your next appointment, please contact your pharmacy*  Follow-Up: Call back or seek an in-person evaluation if the symptoms worsen or if the condition fails to improve as anticipated.   If you have been instructed to have an in-person evaluation today at a local Urgent Care facility, please use the link below. It will take you to a list of all of our available [facility], including address, phone number and hours of operation. Please do not delay care.  [facility]  If you or a family member do not have a primary care provider, use the link below to schedule a visit and establish care. When you choose a [facility] primary care physician or advanced practice provider, you gain a long-term partner in health. Find a Primary Care Provider  Learn more about 's in-office and virtual care options: [facility]

## 2021-07-06 DIAGNOSIS — M5033 Other cervical disc degeneration, cervicothoracic region: Secondary | ICD-10-CM | POA: Diagnosis not present

## 2021-07-06 DIAGNOSIS — M9902 Segmental and somatic dysfunction of thoracic region: Secondary | ICD-10-CM | POA: Diagnosis not present

## 2021-07-06 DIAGNOSIS — M5412 Radiculopathy, cervical region: Secondary | ICD-10-CM | POA: Diagnosis not present

## 2021-07-06 DIAGNOSIS — M9901 Segmental and somatic dysfunction of cervical region: Secondary | ICD-10-CM | POA: Diagnosis not present

## 2021-07-09 ENCOUNTER — Other Ambulatory Visit: Payer: Self-pay | Admitting: Family Medicine

## 2021-07-10 DIAGNOSIS — M9901 Segmental and somatic dysfunction of cervical region: Secondary | ICD-10-CM | POA: Diagnosis not present

## 2021-07-10 DIAGNOSIS — M5412 Radiculopathy, cervical region: Secondary | ICD-10-CM | POA: Diagnosis not present

## 2021-07-10 DIAGNOSIS — M9902 Segmental and somatic dysfunction of thoracic region: Secondary | ICD-10-CM | POA: Diagnosis not present

## 2021-07-10 DIAGNOSIS — M5033 Other cervical disc degeneration, cervicothoracic region: Secondary | ICD-10-CM | POA: Diagnosis not present

## 2021-07-12 DIAGNOSIS — M9901 Segmental and somatic dysfunction of cervical region: Secondary | ICD-10-CM | POA: Diagnosis not present

## 2021-07-12 DIAGNOSIS — M5033 Other cervical disc degeneration, cervicothoracic region: Secondary | ICD-10-CM | POA: Diagnosis not present

## 2021-07-12 DIAGNOSIS — M9902 Segmental and somatic dysfunction of thoracic region: Secondary | ICD-10-CM | POA: Diagnosis not present

## 2021-07-12 DIAGNOSIS — M5412 Radiculopathy, cervical region: Secondary | ICD-10-CM | POA: Diagnosis not present

## 2021-07-14 DIAGNOSIS — M9902 Segmental and somatic dysfunction of thoracic region: Secondary | ICD-10-CM | POA: Diagnosis not present

## 2021-07-14 DIAGNOSIS — M5033 Other cervical disc degeneration, cervicothoracic region: Secondary | ICD-10-CM | POA: Diagnosis not present

## 2021-07-14 DIAGNOSIS — M9901 Segmental and somatic dysfunction of cervical region: Secondary | ICD-10-CM | POA: Diagnosis not present

## 2021-07-14 DIAGNOSIS — M5412 Radiculopathy, cervical region: Secondary | ICD-10-CM | POA: Diagnosis not present

## 2021-07-18 DIAGNOSIS — M9902 Segmental and somatic dysfunction of thoracic region: Secondary | ICD-10-CM | POA: Diagnosis not present

## 2021-07-18 DIAGNOSIS — M9901 Segmental and somatic dysfunction of cervical region: Secondary | ICD-10-CM | POA: Diagnosis not present

## 2021-07-18 DIAGNOSIS — M5033 Other cervical disc degeneration, cervicothoracic region: Secondary | ICD-10-CM | POA: Diagnosis not present

## 2021-07-18 DIAGNOSIS — M5412 Radiculopathy, cervical region: Secondary | ICD-10-CM | POA: Diagnosis not present

## 2021-07-19 DIAGNOSIS — M5412 Radiculopathy, cervical region: Secondary | ICD-10-CM | POA: Diagnosis not present

## 2021-07-19 DIAGNOSIS — M9902 Segmental and somatic dysfunction of thoracic region: Secondary | ICD-10-CM | POA: Diagnosis not present

## 2021-07-19 DIAGNOSIS — M9901 Segmental and somatic dysfunction of cervical region: Secondary | ICD-10-CM | POA: Diagnosis not present

## 2021-07-19 DIAGNOSIS — M5033 Other cervical disc degeneration, cervicothoracic region: Secondary | ICD-10-CM | POA: Diagnosis not present

## 2021-07-20 DIAGNOSIS — M5412 Radiculopathy, cervical region: Secondary | ICD-10-CM | POA: Diagnosis not present

## 2021-07-20 DIAGNOSIS — M9901 Segmental and somatic dysfunction of cervical region: Secondary | ICD-10-CM | POA: Diagnosis not present

## 2021-07-20 DIAGNOSIS — M9902 Segmental and somatic dysfunction of thoracic region: Secondary | ICD-10-CM | POA: Diagnosis not present

## 2021-07-20 DIAGNOSIS — M5033 Other cervical disc degeneration, cervicothoracic region: Secondary | ICD-10-CM | POA: Diagnosis not present

## 2021-07-24 ENCOUNTER — Telehealth: Payer: PPO | Admitting: Physician Assistant

## 2021-07-24 DIAGNOSIS — M9901 Segmental and somatic dysfunction of cervical region: Secondary | ICD-10-CM | POA: Diagnosis not present

## 2021-07-24 DIAGNOSIS — M9902 Segmental and somatic dysfunction of thoracic region: Secondary | ICD-10-CM | POA: Diagnosis not present

## 2021-07-24 DIAGNOSIS — N390 Urinary tract infection, site not specified: Secondary | ICD-10-CM | POA: Diagnosis not present

## 2021-07-24 DIAGNOSIS — M5412 Radiculopathy, cervical region: Secondary | ICD-10-CM | POA: Diagnosis not present

## 2021-07-24 DIAGNOSIS — M5033 Other cervical disc degeneration, cervicothoracic region: Secondary | ICD-10-CM | POA: Diagnosis not present

## 2021-07-24 MED ORDER — SULFAMETHOXAZOLE-TRIMETHOPRIM 800-160 MG PO TABS: 1.0000 | ORAL_TABLET | Freq: Two times a day (BID) | ORAL | 0 refills | Status: DC

## 2021-07-24 NOTE — Progress Notes (Signed)
Virtual Visit Consent   Shannon [name], you are scheduled for a virtual visit with a [facility] provider today.     Just as with appointments in the office, your consent must be obtained to participate.  Your consent will be active for this visit and any virtual visit you may have with one of our providers in the next 365 days.     If you have a MyChart account, a copy of this consent can be sent to you electronically.  All virtual visits are billed to your insurance company just like a traditional visit in the office.    As this is a virtual visit, video technology does not allow for your provider to perform a traditional examination.  This may limit your provider's ability to fully assess your condition.  If your provider identifies any concerns that need to be evaluated in person or the need to arrange testing (such as labs, EKG, etc.), we will make arrangements to do so.     Although advances in technology are sophisticated, we cannot ensure that it will always work on either your end or our end.  If the connection with a video visit is poor, the visit may have to be switched to a telephone visit.  With either a video or telephone visit, we are not always able to ensure that we have a secure connection.     I need to obtain your verbal consent now.   Are you willing to proceed with your visit today?    Shannon [name] has provided verbal consent on 07/24/2021 for a virtual visit (video or telephone).   [name], [place]   Date: 07/24/2021 1:17 PM   Virtual Visit via Video Note   I, [name], connected with  Shannon [name]  ([id number], 1951/03/12) on 07/24/21 at  1:30 PM EST by a video-enabled telemedicine application and verified that I am speaking with the correct person using two identifiers.  Location: Patient: Virtual Visit Location Patient: Work Provider: Scientist, research (medical) Provider: Home Office   I discussed the limitations of evaluation and management by  telemedicine and the availability of in person appointments. The patient expressed understanding and agreed to proceed.    History of Present Illness: Shannon [name] is a 71 y.o. who identifies as a female who was assigned female at birth, and is being seen today for possible UTI. Notes symptoms starting this morning with urinary urgency, frequency, dysuria and urinary hesitancy. Denies fever, chills, nausea or vomiting, back pain or abdominal pain. Has been getting a UTI once a month the past couple of months which is new for her. Tried to reach out to her PCP office today but they were closed.    HPI: HPI  Problems:  Patient Active Problem List   Diagnosis Date Noted   Stress incontinence 11/08/2020   Stress 11/08/2020   Skin exam, screening for cancer 11/08/2020   Erythematous rash 10/15/2018   Ganglion cyst of finger 09/24/2018   Eczema of scalp 04/03/2017   Hair loss 06/02/2014   Encounter for general adult medical examination with abnormal findings 11/24/2013   Hyperlipidemia 05/20/2013   Hypothyroidism 05/20/2013   Osteoporosis 05/20/2013   Thyroid nodule 05/20/2013   Carpal tunnel syndrome 02/10/2013   Elevated TSH 02/10/2013    Allergies: No Known Allergies Medications:  Current Outpatient Medications:    sulfamethoxazole-trimethoprim (BACTRIM DS) 800-160 MG tablet, Take 1 tablet by mouth 2 (two) times daily., Disp: 10 tablet, Rfl:  0   augmented betamethasone dipropionate (DIPROLENE-AF) 0.05 % cream, , Disp: , Rfl:    b complex vitamins capsule, Take by mouth., Disp: , Rfl:    benzonatate (TESSALON) 100 MG capsule, Take 1 capsule (100 mg total) by mouth 3 (three) times daily as needed for cough., Disp: 30 capsule, Rfl: 0   Cholecalciferol (D3 HIGH POTENCY) 125 MCG (5000 UT) capsule, , Disp: , Rfl:    Cholecalciferol (VITAMIN D3) 50 MCG (2000 UT) capsule, , Disp: , Rfl:    estradiol (ESTRACE) 0.1 MG/GM vaginal cream, 1 gram intravaginally at bedtime daily for 2-3 weeks, then  decrease to 3 times per week, Disp: 42.5 g, Rfl: 12   levothyroxine (SYNTHROID) 88 MCG tablet, Take 1 tablet (88 mcg total) by mouth daily., Disp: 90 tablet, Rfl: 3   Multiple Vitamins-Minerals (CENTRUM SILVER PO), Take 1 tablet by mouth daily., Disp: , Rfl:    neomycin-polymyxin b-dexamethasone (MAXITROL) 3.5-10000-0.1 SUSP, , Disp: , Rfl:    ondansetron (ZOFRAN) 4 MG tablet, Take 1 tablet (4 mg total) by mouth every 8 (eight) hours as needed for nausea or vomiting., Disp: 12 tablet, Rfl: 0   simvastatin (ZOCOR) 20 MG tablet, TAKE ONE TABLET BY MOUTH EVERY EVENING, Disp: 90 tablet, Rfl: 1  Observations/Objective: Patient is well-developed, well-nourished in no acute distress.  Resting comfortably at home.  Head is normocephalic, atraumatic.  No labored breathing. Speech is clear and coherent with logical content.  Patient is alert and oriented at baseline.   Assessment and Plan: 1. Frequent UTI - sulfamethoxazole-trimethoprim (BACTRIM DS) 800-160 MG tablet; Take 1 tablet by mouth 2 (two) times daily.  Dispense: 10 tablet; Refill: 0  Giving PCP office closed and absence of alarm signs/symptoms will start treatment today with close PCP follow-up for culture and assessment of possible causes of frequent UTI -- atrophic vaginitis, etc. Supportive measures and OTC medications reviewed. Rx Bactrim BID x 5 days. Strict ER precautions reviewed. She is to contact PCP office tomorrow to schedule appointment for culture and further follow-up.   Follow Up Instructions: I discussed the assessment and treatment plan with the patient. The patient was provided an opportunity to ask questions and all were answered. The patient agreed with the plan and demonstrated an understanding of the instructions.  A copy of instructions were sent to the patient via MyChart unless otherwise noted below.   The patient was advised to call back or seek an in-person evaluation if the symptoms worsen or if the condition fails  to improve as anticipated.  Time:  I spent 15 minutes with the patient via telehealth technology discussing the above problems/concerns.    [name], PA-C

## 2021-07-24 NOTE — Patient Instructions (Signed)
Shannon [name], thank you for joining [name], PA-C for today's virtual visit.  While this provider is not your primary care provider (PCP), if your PCP is located in our provider database this encounter information will be shared with them immediately following your visit.  Consent: (Patient) Shannon [name] provided verbal consent for this virtual visit at the beginning of the encounter.  Current Medications:  Current Outpatient Medications:    sulfamethoxazole-trimethoprim (BACTRIM DS) 800-160 MG tablet, Take 1 tablet by mouth 2 (two) times daily., Disp: 10 tablet, Rfl: 0   augmented betamethasone dipropionate (DIPROLENE-AF) 0.05 % cream, , Disp: , Rfl:    b complex vitamins capsule, Take by mouth., Disp: , Rfl:    benzonatate (TESSALON) 100 MG capsule, Take 1 capsule (100 mg total) by mouth 3 (three) times daily as needed for cough., Disp: 30 capsule, Rfl: 0   Cholecalciferol (D3 HIGH POTENCY) 125 MCG (5000 UT) capsule, , Disp: , Rfl:    Cholecalciferol (VITAMIN D3) 50 MCG (2000 UT) capsule, , Disp: , Rfl:    estradiol (ESTRACE) 0.1 MG/GM vaginal cream, 1 gram intravaginally at bedtime daily for 2-3 weeks, then decrease to 3 times per week, Disp: 42.5 g, Rfl: 12   levothyroxine (SYNTHROID) 88 MCG tablet, Take 1 tablet (88 mcg total) by mouth daily., Disp: 90 tablet, Rfl: 3   Multiple Vitamins-Minerals (CENTRUM SILVER PO), Take 1 tablet by mouth daily., Disp: , Rfl:    neomycin-polymyxin b-dexamethasone (MAXITROL) 3.5-10000-0.1 SUSP, , Disp: , Rfl:    ondansetron (ZOFRAN) 4 MG tablet, Take 1 tablet (4 mg total) by mouth every 8 (eight) hours as needed for nausea or vomiting., Disp: 12 tablet, Rfl: 0   simvastatin (ZOCOR) 20 MG tablet, TAKE ONE TABLET BY MOUTH EVERY EVENING, Disp: 90 tablet, Rfl: 1   Medications ordered in this encounter:  Meds ordered this encounter  Medications   sulfamethoxazole-trimethoprim (BACTRIM DS) 800-160 MG tablet    Sig: Take 1 tablet by mouth 2  (two) times daily.    Dispense:  10 tablet    Refill:  0    Order Specific Question:   Supervising Provider    Answer:   Sabra Heck, BRIAN [[id number]]     *If you need refills on other medications prior to your next appointment, please contact your pharmacy*  Follow-Up: Call back or seek an in-person evaluation if the symptoms worsen or if the condition fails to improve as anticipated.  Other Instructions Your symptoms are consistent with a bladder infection, also called acute cystitis. Please take your antibiotic (Bactrim) as directed until all pills are gone.  Stay very well hydrated.  Consider a daily probiotic (Align, Culturelle, or Activia) to help prevent stomach upset caused by the antibiotic.  Taking a probiotic daily may also help prevent recurrent UTIs.  Also consider taking AZO (Phenazopyridine) tablets to help decrease pain with urination.    Contact your PCP office tomorrow to get scheduled for an in-person evaluation of frequent UTI and to get updated urine culture.   Urinary Tract Infection A urinary tract infection (UTI) can occur any place along the urinary tract. The tract includes the kidneys, ureters, bladder, and urethra. A type of germ called bacteria often causes a UTI. UTIs are often helped with antibiotic medicine.  HOME CARE  If given, take antibiotics as told by your doctor. Finish them even if you start to feel better. Drink enough fluids to keep your pee (urine) clear or pale yellow. Avoid tea, drinks with  caffeine, and bubbly (carbonated) drinks. Pee often. Avoid holding your pee in for a long time. Pee before and after having sex (intercourse). Wipe from front to back after you poop (bowel movement) if you are a woman. Use each tissue only once. GET HELP RIGHT AWAY IF:  You have back pain. You have lower belly (abdominal) pain. You have chills. You feel sick to your stomach (nauseous). You throw up (vomit). Your burning or discomfort with peeing does not go  away. You have a fever. Your symptoms are not better in 3 days. MAKE SURE YOU:  Understand these instructions. Will watch your condition. Will get help right away if you are not doing well or get worse. Document Released: 12/26/2007 Document Revised: 04/02/2012 Document Reviewed: 02/07/2012 [facility] Patient Information 2015 [place], [place]. This information is not intended to replace advice given to you by your health care provider. Make sure you discuss any questions you have with your health care provider.    If you have been instructed to have an in-person evaluation today at a local Urgent Care facility, please use the link below. It will take you to a list of all of our available [facility], including address, phone number and hours of operation. Please do not delay care.  [facility]  If you or a family member do not have a primary care provider, use the link below to schedule a visit and establish care. When you choose a [facility] primary care physician or advanced practice provider, you gain a long-term partner in health. Find a Primary Care Provider  Learn more about 's in-office and virtual care options: [facility]

## 2021-07-26 DIAGNOSIS — M5033 Other cervical disc degeneration, cervicothoracic region: Secondary | ICD-10-CM | POA: Diagnosis not present

## 2021-07-26 DIAGNOSIS — M9901 Segmental and somatic dysfunction of cervical region: Secondary | ICD-10-CM | POA: Diagnosis not present

## 2021-07-26 DIAGNOSIS — M9902 Segmental and somatic dysfunction of thoracic region: Secondary | ICD-10-CM | POA: Diagnosis not present

## 2021-07-26 DIAGNOSIS — M5412 Radiculopathy, cervical region: Secondary | ICD-10-CM | POA: Diagnosis not present

## 2021-07-27 ENCOUNTER — Encounter: Payer: Self-pay | Admitting: Family Medicine

## 2021-07-28 DIAGNOSIS — M5033 Other cervical disc degeneration, cervicothoracic region: Secondary | ICD-10-CM | POA: Diagnosis not present

## 2021-07-28 DIAGNOSIS — M9902 Segmental and somatic dysfunction of thoracic region: Secondary | ICD-10-CM | POA: Diagnosis not present

## 2021-07-28 DIAGNOSIS — M9901 Segmental and somatic dysfunction of cervical region: Secondary | ICD-10-CM | POA: Diagnosis not present

## 2021-07-28 DIAGNOSIS — M5412 Radiculopathy, cervical region: Secondary | ICD-10-CM | POA: Diagnosis not present

## 2021-07-28 NOTE — Telephone Encounter (Signed)
Pt has scheduled an appt on jan 11 at 10am. Pt stated that this is her 2nd UTI. pt has not done a culture yet

## 2021-07-31 ENCOUNTER — Telehealth: Payer: PPO | Admitting: Physician Assistant

## 2021-07-31 ENCOUNTER — Ambulatory Visit (INDEPENDENT_AMBULATORY_CARE_PROVIDER_SITE_OTHER): Payer: PPO | Admitting: Internal Medicine

## 2021-07-31 ENCOUNTER — Other Ambulatory Visit: Payer: Self-pay

## 2021-07-31 ENCOUNTER — Encounter: Payer: Self-pay | Admitting: Internal Medicine

## 2021-07-31 ENCOUNTER — Encounter: Payer: Self-pay | Admitting: Physician Assistant

## 2021-07-31 VITALS — BP 122/80 | HR 71 | Temp 97.9°F | Ht 66.0 in | Wt 146.8 lb

## 2021-07-31 DIAGNOSIS — R3989 Other symptoms and signs involving the genitourinary system: Secondary | ICD-10-CM

## 2021-07-31 DIAGNOSIS — R300 Dysuria: Secondary | ICD-10-CM | POA: Diagnosis not present

## 2021-07-31 DIAGNOSIS — R350 Frequency of micturition: Secondary | ICD-10-CM | POA: Diagnosis not present

## 2021-07-31 DIAGNOSIS — N3090 Cystitis, unspecified without hematuria: Secondary | ICD-10-CM | POA: Insufficient documentation

## 2021-07-31 LAB — URINALYSIS, MICROSCOPIC ONLY

## 2021-07-31 LAB — POCT URINALYSIS DIPSTICK
Glucose, UA: NEGATIVE
Ketones, UA: NEGATIVE
Nitrite, UA: NEGATIVE
Protein, UA: POSITIVE — AB
Spec Grav, UA: 1.02 (ref 1.010–1.025)
Urobilinogen, UA: 0.2 E.U./dL
pH, UA: 6 (ref 5.0–8.0)

## 2021-07-31 NOTE — Progress Notes (Signed)
Subjective:  Patient ID: Shannon [name], female    DOB: 1951-06-04  Age: 70 y.o. MRN: [id number]  CC: The encounter diagnosis was Dysuria.   This visit occurred during the SARS-CoV-2 public health emergency.  Safety protocols were in place, including screening questions prior to the visit, additional usage of staff PPE, and extensive cleaning of exam room while observing appropriate contact time as indicated for disinfecting solutions.    HPI Shannon [name] presents for  Chief Complaint  Patient presents with   Urinary Tract Infection    Burning and frequency with urination   71 yr old female with post menopausal urinary stress incontinence presents with dysuria and frequency that has been intermittent and recurrent since November. Has had several rounds of empiric antibiotics starting with cephalexin in late  october.  Symptoms eventually resolved but returned after intercourse  and she was treated with a second round of keflex in mid November 3rd round was just recently with Septra  ,  finsihed 3 days ago.  Still havig mild symptoms.  Has not taken azo ,  has been prescribed but has not started vaginal estrogen.    Both parents died in 2020-10-15.    Outpatient Medications Prior to Visit  Medication Sig Dispense Refill   augmented betamethasone dipropionate (DIPROLENE-AF) 0.05 % cream      b complex vitamins capsule Take by mouth.     benzonatate (TESSALON) 100 MG capsule Take 1 capsule (100 mg total) by mouth 3 (three) times daily as needed for cough. 30 capsule 0   Cholecalciferol (D3 HIGH POTENCY) 125 MCG (5000 UT) capsule      Cholecalciferol (VITAMIN D3) 50 MCG (2000 UT) capsule      estradiol (ESTRACE) 0.1 MG/GM vaginal cream 1 gram intravaginally at bedtime daily for 2-3 weeks, then decrease to 3 times per week 42.5 g 12   levothyroxine (SYNTHROID) 88 MCG tablet Take 1 tablet (88 mcg total) by mouth daily. 90 tablet 3   Multiple Vitamins-Minerals (CENTRUM SILVER PO) Take 1  tablet by mouth daily.     neomycin-polymyxin b-dexamethasone (MAXITROL) 3.5-10000-0.1 SUSP      ondansetron (ZOFRAN) 4 MG tablet Take 1 tablet (4 mg total) by mouth every 8 (eight) hours as needed for nausea or vomiting. 12 tablet 0   simvastatin (ZOCOR) 20 MG tablet TAKE ONE TABLET BY MOUTH EVERY EVENING 90 tablet 1   No facility-administered medications prior to visit.    Review of Systems;  Patient denies headache, fevers, malaise, unintentional weight loss, skin rash, eye pain, sinus congestion and sinus pain, sore throat, dysphagia,  hemoptysis , cough, dyspnea, wheezing, chest pain, palpitations, orthopnea, edema, abdominal pain, nausea, melena, diarrhea, constipation, flank pain, dysuria, hematuria, urinary  Frequency, nocturia, numbness, tingling, seizures,  Focal weakness, Loss of consciousness,  Tremor, insomnia, depression, anxiety, and suicidal ideation.      Objective:  BP 122/80 (BP Location: Left Arm, Patient Position: Sitting, Cuff Size: Normal)    Pulse 71    Temp 97.9 F (36.6 C) (Oral)    Ht 5\' 6"  (1.676 m)    Wt 146 lb 12.8 oz (66.6 kg)    SpO2 95%    BMI 23.69 kg/m   BP Readings from Last 3 Encounters:  07/31/21 122/80  01/02/21 128/72  11/08/20 120/70    Wt Readings from Last 3 Encounters:  07/31/21 146 lb 12.8 oz (66.6 kg)  01/02/21 141 lb 12.8 oz (64.3 kg)  11/08/20 139 lb 6.4 oz (63.2 kg)  General appearance: alert, cooperative and appears stated age Ears: normal TM's and external ear canals both ears Throat: lips, mucosa, and tongue normal; teeth and gums normal Neck: no adenopathy, no carotid bruit, supple, symmetrical, trachea midline and thyroid not enlarged, symmetric, no tenderness/mass/nodules Back: symmetric, no curvature. ROM normal. No CVA tenderness. Lungs: clear to auscultation bilaterally Heart: regular rate and rhythm, S1, S2 normal, no murmur, click, rub or gallop Abdomen: soft, non-tender; bowel sounds normal; no masses,  no  organomegaly Pulses: 2+ and symmetric Skin: Skin color, texture, turgor normal. No rashes or lesions Lymph nodes: Cervical, supraclavicular, and axillary nodes normal.  No results found for: HGBA1C  Lab Results  Component Value Date   CREATININE 0.70 11/03/2020   CREATININE 0.77 10/14/2019   CREATININE 0.79 09/22/2018    Lab Results  Component Value Date   WBC 4.4 02/22/2015   HGB 13.2 02/22/2015   HCT 38.8 02/22/2015   PLT 254.0 02/22/2015   GLUCOSE 83 11/03/2020   CHOL 160 11/03/2020   TRIG 74.0 11/03/2020   HDL 71.50 11/03/2020   LDLDIRECT 97.0 10/09/2017   LDLCALC 74 11/03/2020   ALT 28 11/03/2020   AST 28 11/03/2020   NA 137 11/03/2020   K 4.2 11/03/2020   CL 102 11/03/2020   CREATININE 0.70 11/03/2020   BUN 18 11/03/2020   CO2 27 11/03/2020   TSH 2.25 11/03/2020   MICROALBUR 2.1 (H) 11/24/2013    MM 3D SCREEN BREAST BILATERAL  Result Date: 01/12/2021 CLINICAL DATA:  Screening. EXAM: DIGITAL SCREENING BILATERAL MAMMOGRAM WITH TOMOSYNTHESIS AND CAD TECHNIQUE: Bilateral screening digital craniocaudal and mediolateral oblique mammograms were obtained. Bilateral screening digital breast tomosynthesis was performed. The images were evaluated with computer-aided detection. COMPARISON:  Previous exam(s). ACR Breast Density Category b: There are scattered areas of fibroglandular density. FINDINGS: There are no findings suspicious for malignancy. IMPRESSION: No mammographic evidence of malignancy. A result letter of this screening mammogram will be mailed directly to the patient. RECOMMENDATION: Screening mammogram in one year. (Code:[id number]-B-01Y) BI-RADS CATEGORY  1: Negative. Electronically Signed   By: Valentino Saxon MD   On: 01/12/2021 16:23   Assessment & Plan:   Problem List Items Addressed This Visit     Dysuria - Primary    ddx  Discussed with patient and includes recurrent UTI,   interstitial cystitis and atrophic vaginitis. Past evaluations reviewed.  There has  been no UA or culture done to date despite 3 rounds of antibiotics.   Advised to start vaginal estrogen and AZO pending urine culture.        Relevant Orders   POCT Urinalysis Dipstick (Completed)   Urine Culture   Urine Microscopic Only (Completed)    I am having Shannon [name] maintain her b complex vitamins, Multiple Vitamins-Minerals (CENTRUM SILVER PO), augmented betamethasone dipropionate, D3 High Potency, Vitamin D3, neomycin-polymyxin b-dexamethasone, benzonatate, levothyroxine, ondansetron, estradiol, and simvastatin.  No orders of the defined types were placed in this encounter.    I provided  30 minutes of  face-to-face time during this encounter reviewing patient's current problems and past surgeries, labs and imaging studies, providing counseling on the above mentioned problems , and coordination  of care .   Follow-up: No follow-ups on file.   Crecencio Mc, MD

## 2021-07-31 NOTE — Progress Notes (Signed)
Virtual Visit Consent   Shannon [name], you are scheduled for a virtual visit with a [facility] provider today.     Just as with appointments in the office, your consent must be obtained to participate.  Your consent will be active for this visit and any virtual visit you may have with one of our providers in the next 365 days.     If you have a MyChart account, a copy of this consent can be sent to you electronically.  All virtual visits are billed to your insurance company just like a traditional visit in the office.    As this is a virtual visit, video technology does not allow for your provider to perform a traditional examination.  This may limit your provider's ability to fully assess your condition.  If your provider identifies any concerns that need to be evaluated in person or the need to arrange testing (such as labs, EKG, etc.), we will make arrangements to do so.     Although advances in technology are sophisticated, we cannot ensure that it will always work on either your end or our end.  If the connection with a video visit is poor, the visit may have to be switched to a telephone visit.  With either a video or telephone visit, we are not always able to ensure that we have a secure connection.     I need to obtain your verbal consent now.   Are you willing to proceed with your visit today?    Shannon [name] has provided verbal consent on 07/31/2021 for a virtual visit (video or telephone).   [name], [place]   Date: 07/31/2021 9:29 AM   Virtual Visit via Video Note   I, [name], connected with  Shannon [name]  ([id number], 03-08-1951) on 07/31/21 at  9:00 AM EST by a video-enabled telemedicine application and verified that I am speaking with the correct person using two identifiers.  Location: Patient: Virtual Visit Location Patient: Home Provider: Virtual Visit Location Provider: Home Office   I discussed the limitations of evaluation and management by  telemedicine and the availability of in person appointments. The patient expressed understanding and agreed to proceed.    History of Present Illness: Shannon [name] is a 71 y.o. who identifies as a female who was assigned female at birth, and is being seen today for continual UTI symptoms after treatment with 5-day course of Bactrim. Notes taking entire course of antibiotic, tolerating well, with improvement of symptoms. Denies full resolution of symptoms however and since completion of antibiotic, notes recurrence of full-blown UTI symptoms including urgency, frequency, hesitancy, dysuria. Denies fever, chills, flank pain, nausea or vomiting. She did reach out to PCP office initially last week to inquire about urine culture due to history of frequent UTI per this providers request. Was told to wait until off of antibiotic. Called again this morning to let them know of current symptoms to be worked in and first available was Wednesday per scheduler. She was told to go to an Urgent Care if could not wait until Wednesday. She notes she is scared to go to urgent care unless no other option due to high rates of COVID and influenza.   HPI: HPI  Problems:  Patient Active Problem List   Diagnosis Date Noted   Stress incontinence 11/08/2020   Stress 11/08/2020   Skin exam, screening for cancer 11/08/2020   Erythematous rash 10/15/2018   Ganglion cyst of finger  09/24/2018   Eczema of scalp 04/03/2017   Hair loss 06/02/2014   Encounter for general adult medical examination with abnormal findings 11/24/2013   Hyperlipidemia 05/20/2013   Hypothyroidism 05/20/2013   Osteoporosis 05/20/2013   Thyroid nodule 05/20/2013   Carpal tunnel syndrome 02/10/2013   Elevated TSH 02/10/2013    Allergies: No Known Allergies Medications:  Current Outpatient Medications:    augmented betamethasone dipropionate (DIPROLENE-AF) 0.05 % cream, , Disp: , Rfl:    b complex vitamins capsule, Take by mouth., Disp: , Rfl:     benzonatate (TESSALON) 100 MG capsule, Take 1 capsule (100 mg total) by mouth 3 (three) times daily as needed for cough., Disp: 30 capsule, Rfl: 0   Cholecalciferol (D3 HIGH POTENCY) 125 MCG (5000 UT) capsule, , Disp: , Rfl:    Cholecalciferol (VITAMIN D3) 50 MCG (2000 UT) capsule, , Disp: , Rfl:    estradiol (ESTRACE) 0.1 MG/GM vaginal cream, 1 gram intravaginally at bedtime daily for 2-3 weeks, then decrease to 3 times per week, Disp: 42.5 g, Rfl: 12   levothyroxine (SYNTHROID) 88 MCG tablet, Take 1 tablet (88 mcg total) by mouth daily., Disp: 90 tablet, Rfl: 3   Multiple Vitamins-Minerals (CENTRUM SILVER PO), Take 1 tablet by mouth daily., Disp: , Rfl:    neomycin-polymyxin b-dexamethasone (MAXITROL) 3.5-10000-0.1 SUSP, , Disp: , Rfl:    ondansetron (ZOFRAN) 4 MG tablet, Take 1 tablet (4 mg total) by mouth every 8 (eight) hours as needed for nausea or vomiting., Disp: 12 tablet, Rfl: 0   simvastatin (ZOCOR) 20 MG tablet, TAKE ONE TABLET BY MOUTH EVERY EVENING, Disp: 90 tablet, Rfl: 1  Observations/Objective: Patient is well-developed, well-nourished in no acute distress.  Resting comfortably at home.  Head is normocephalic, atraumatic.  No labored breathing. Speech is clear and coherent with logical content.  Patient is alert and oriented at baseline.   Assessment and Plan: 1. Suspected UTI - Urine Culture - Urinalysis  Initially ordering UA and culture since she could not be worked in by PCP with plan to start Cipro while culture pending. Thankfully her PCP office was able to work her in for late morning so will have her be evaluated in person for examination as preferred by this provider, UA and culture at that time. No charge for video visit since having her seen in person.   Follow Up Instructions: I discussed the assessment and treatment plan with the patient. The patient was provided an opportunity to ask questions and all were answered. The patient agreed with the plan and  demonstrated an understanding of the instructions.  A copy of instructions were sent to the patient via MyChart unless otherwise noted below.   The patient was advised to call back or seek an in-person evaluation if the symptoms worsen or if the condition fails to improve as anticipated.  Time:  I spent 12 minutes with the patient via telehealth technology discussing the above problems/concerns.    [name], PA-C

## 2021-07-31 NOTE — Assessment & Plan Note (Addendum)
ddx  Discussed with patient and includes recurrent UTI,   interstitial cystitis and atrophic vaginitis. Past evaluations reviewed.  There has been no UA or culture done to date despite 3 rounds of antibiotics.   Advised to start vaginal estrogen and AZO pending urine culture.    Addendum:  UTi is resistant to septra (prescribed by urology jan 13 ).  Sending cipro 500 mg bid x 5 days .  Probiotic  advised for 3 weeks

## 2021-07-31 NOTE — Patient Instructions (Signed)
UTI symptoms in post menopausal women may be due to :  1) a genuine UTI  2) atrophic vaginitis (aggravated  by intercourse,  swimming,  exercise, etc)  3) interstitial cystitis   No more antibiotics unless UA and culture confirm UTI  Ok to use AZO for burning and ok to start estrogen using the applicator previously given to you

## 2021-08-01 DIAGNOSIS — M5412 Radiculopathy, cervical region: Secondary | ICD-10-CM | POA: Diagnosis not present

## 2021-08-01 DIAGNOSIS — M9902 Segmental and somatic dysfunction of thoracic region: Secondary | ICD-10-CM | POA: Diagnosis not present

## 2021-08-01 DIAGNOSIS — M9901 Segmental and somatic dysfunction of cervical region: Secondary | ICD-10-CM | POA: Diagnosis not present

## 2021-08-01 DIAGNOSIS — M5033 Other cervical disc degeneration, cervicothoracic region: Secondary | ICD-10-CM | POA: Diagnosis not present

## 2021-08-01 LAB — URINE CULTURE
MICRO NUMBER:: 12844962
SPECIMEN QUALITY:: ADEQUATE

## 2021-08-02 ENCOUNTER — Ambulatory Visit: Payer: PPO | Admitting: Family

## 2021-08-02 DIAGNOSIS — R350 Frequency of micturition: Secondary | ICD-10-CM | POA: Diagnosis not present

## 2021-08-03 ENCOUNTER — Encounter: Payer: Self-pay | Admitting: Internal Medicine

## 2021-08-03 DIAGNOSIS — M5033 Other cervical disc degeneration, cervicothoracic region: Secondary | ICD-10-CM | POA: Diagnosis not present

## 2021-08-03 DIAGNOSIS — M9901 Segmental and somatic dysfunction of cervical region: Secondary | ICD-10-CM | POA: Diagnosis not present

## 2021-08-03 DIAGNOSIS — M5412 Radiculopathy, cervical region: Secondary | ICD-10-CM | POA: Diagnosis not present

## 2021-08-03 DIAGNOSIS — M9902 Segmental and somatic dysfunction of thoracic region: Secondary | ICD-10-CM | POA: Diagnosis not present

## 2021-08-03 LAB — URINALYSIS, ROUTINE W REFLEX MICROSCOPIC

## 2021-08-04 ENCOUNTER — Encounter: Payer: Self-pay | Admitting: Urology

## 2021-08-04 ENCOUNTER — Other Ambulatory Visit: Payer: Self-pay

## 2021-08-04 ENCOUNTER — Ambulatory Visit: Payer: PPO | Admitting: Urology

## 2021-08-04 VITALS — BP 122/74 | HR 76 | Ht 66.0 in | Wt 147.0 lb

## 2021-08-04 DIAGNOSIS — N3090 Cystitis, unspecified without hematuria: Secondary | ICD-10-CM | POA: Diagnosis not present

## 2021-08-04 DIAGNOSIS — N3000 Acute cystitis without hematuria: Secondary | ICD-10-CM | POA: Diagnosis not present

## 2021-08-04 LAB — URINE CULTURE
MICRO NUMBER:: 12857043
SPECIMEN QUALITY:: ADEQUATE

## 2021-08-04 MED ORDER — ESTRADIOL 0.1 MG/GM VA CREA: TOPICAL_CREAM | VAGINAL | 12 refills | Status: DC

## 2021-08-04 MED ORDER — SULFAMETHOXAZOLE-TRIMETHOPRIM 800-160 MG PO TABS: 1.0000 | ORAL_TABLET | Freq: Two times a day (BID) | ORAL | 0 refills | Status: DC

## 2021-08-04 NOTE — Progress Notes (Signed)
08/04/2021 11:11 AM   Shannon [name] March 06, 1951 [id number]  Referring provider: Crecencio Mc, MD [place],  [place] [address]  Chief Complaint  Patient presents with   New Patient (Initial Visit)   Urinary Frequency    HPI: 71 year old struggling with urinary urgency frequency and UTI symptoms.  Started several months ago.  Unfortunately she was traveling ended up doing several virtual visits without UA urinalysis.  She reports that the antibiotics made her feel slightly better but never completely resolved.    She had 2 urinalysis this week with Dr. Derrel Nip.  Was ones on 07/31/2020 with moderate leukocytes but did not end up growing any bacteria.  This was repeated on the 11th which was grossly positive on microscopic urine culture is still pending.  She was not prescribed antibiotics.  She has been treated with Keflex x2, and most recently Bactrim.  She was prescribed estrogen but has not started it.  Based on her urinalysis 2 days ago which is frankly positive, I do suspect that she will end up growing antibiotics  She has been using concentrated cranberry juice.  She occasionally takes probiotics.  Prior to this, she had no significant urinary issues or issues with UTIs.  No bowel issues.  She is sexually active does endorse vaginal dryness.   PMH: Past Medical History:  Diagnosis Date   GERD (gastroesophageal reflux disease)    Hyperlipidemia    Osteoporosis    now has osteopenia   Thyroid disease     Surgical History: Past Surgical History:  Procedure Laterality Date   COLOSTOMY  2016   HAND SURGERY     40 yrs ago    Home Medications:  Allergies as of 08/04/2021   No Known Allergies      Medication List        Accurate as of August 04, 2021 11:11 AM. If you have any questions, ask your nurse or doctor.          STOP taking these medications    augmented betamethasone dipropionate 0.05 % cream Commonly known as:  DIPROLENE-AF Stopped by: Hollice Espy, MD   benzonatate 100 MG capsule Commonly known as: TESSALON Stopped by: Hollice Espy, MD   neomycin-polymyxin b-dexamethasone 3.5-10000-0.1 Susp Commonly known as: MAXITROL Stopped by: Hollice Espy, MD   ondansetron 4 MG tablet Commonly known as: Zofran Stopped by: Hollice Espy, MD       TAKE these medications    b complex vitamins capsule Take by mouth.   CENTRUM SILVER PO Take 1 tablet by mouth daily.   D3 High Potency 125 MCG (5000 UT) capsule Generic drug: Cholecalciferol What changed: Another medication with the same name was removed. Continue taking this medication, and follow the directions you see here. Changed by: Hollice Espy, MD   estradiol 0.1 MG/GM vaginal cream Commonly known as: ESTRACE 1 gram intravaginally at bedtime daily for 2-3 weeks, then decrease to 3 times per week   levothyroxine 88 MCG tablet Commonly known as: SYNTHROID Take 1 tablet (88 mcg total) by mouth daily.   simvastatin 20 MG tablet Commonly known as: ZOCOR TAKE ONE TABLET BY MOUTH EVERY EVENING        Allergies: No Known Allergies  Family History: Family History  Problem Relation Age of Onset   Breast cancer Mother 45   Heart disease Father    Cancer Father        Prostate   Diabetes Father  diagnosed later in life   Prostate cancer Father    Colon polyps Father 51   Parkinson's disease Father    Congestive Heart Failure Father    Asthma Brother    Colon cancer Neg Hx    Rectal cancer Neg Hx    Stomach cancer Neg Hx    Esophageal cancer Neg Hx     Social History:  reports that she quit smoking about 32 years ago. Her smoking use included cigarettes. She has never used smokeless tobacco. She reports current alcohol use of about 7.0 standard drinks per week. She reports that she does not use drugs.   Physical Exam: BP 122/74    Pulse 76    Ht 5\' 6"  (1.676 m)    Wt 147 lb (66.7 kg)    BMI 23.73 kg/m    Constitutional:  Alert and oriented, No acute distress. HEENT: [place] AT, moist mucus membranes.  Trachea midline, no masses. Cardiovascular: No clubbing, cyanosis, or edema. Respiratory: Normal respiratory effort, no increased work of breathing. Neurologic: Grossly intact, no focal deficits, moving all 4 extremities. Psychiatric: Normal mood and affect.  Laboratory Data: Lab Results  Component Value Date   WBC 4.4 02/22/2015   HGB 13.2 02/22/2015   HCT 38.8 02/22/2015   MCV 98.1 02/22/2015   PLT 254.0 02/22/2015    Lab Results  Component Value Date   CREATININE 0.70 11/03/2020    Urinalysis    Component Value Date/Time   COLORURINE Orange (A) 08/02/2021 1632   APPEARANCEUR Cloudy (A) 08/02/2021 1632   LABSPEC Color Interference (A) 08/02/2021 1632   PHURINE Color Interference (A) 08/02/2021 1632   GLUCOSEU Color Interference (A) 08/02/2021 1632   HGBUR Color Interference (A) 08/02/2021 1632   BILIRUBINUR Color Interference (A) 08/02/2021 1632   BILIRUBINUR small 07/31/2021 1107   KETONESUR Color Interference (A) 08/02/2021 1632   PROTEINUR Positive (A) 07/31/2021 1107   UROBILINOGEN Color Interference (A) 08/02/2021 1632   NITRITE Color Interference (A) 08/02/2021 1632   LEUKOCYTESUR Color Interference (A) 08/02/2021 1632    Lab Results  Component Value Date   BACTERIA Many(>50/hpf) (A) 08/02/2021     Assessment & Plan:    1. Acute cystitis without hematuria Based on her urinalysis 2 days ago, strongly suspect bacterial UTI, awaiting culture still  Will start on Bactrim DS and adjust based on culture results  2. Recurrent cystitis Strongly suspect that she may have had an incompletely treated bacterial infection versus multidrug resistant UTI in the absence of culture data  We discussed the importance from here on out of obtaining UA/urine culture to guide her management and treatment  Behavioral  recommendations along with daily probiotics, cranberry tablets  twice daily as well as utilization of d-mannose were also discussed.  Lastly, I strongly agree with topical estrogen cream for this postmenopausal woman.  She has no risk factors for use of this medication.  I recommend using a pea-sized amount per urethral meatus 3 times a week, Monday Wednesday Friday for UTI prevention.  We discussed the data behind this in terms of reducing the frequency of infections in postmenopausal women.  I refilled this prescription for her at her request.    Hollice Espy, MD  [facility] [address] [place], [place] [address] [phone]

## 2021-08-04 NOTE — Telephone Encounter (Signed)
Spoke with pt on the phone and advised her to keep her appt today with Urology. Pt gave a verbal understanding.

## 2021-08-05 ENCOUNTER — Encounter: Payer: Self-pay | Admitting: Internal Medicine

## 2021-08-05 MED ORDER — CIPROFLOXACIN HCL 500 MG PO TABS: 500.0000 mg | ORAL_TABLET | Freq: Two times a day (BID) | ORAL | 0 refills | Status: AC

## 2021-08-05 NOTE — Addendum Note (Signed)
Addended by: Crecencio Mc on: 08/05/2021 10:43 AM   Modules accepted: Orders

## 2021-08-08 DIAGNOSIS — M9901 Segmental and somatic dysfunction of cervical region: Secondary | ICD-10-CM | POA: Diagnosis not present

## 2021-08-08 DIAGNOSIS — M9902 Segmental and somatic dysfunction of thoracic region: Secondary | ICD-10-CM | POA: Diagnosis not present

## 2021-08-08 DIAGNOSIS — M5412 Radiculopathy, cervical region: Secondary | ICD-10-CM | POA: Diagnosis not present

## 2021-08-08 DIAGNOSIS — M5033 Other cervical disc degeneration, cervicothoracic region: Secondary | ICD-10-CM | POA: Diagnosis not present

## 2021-08-08 NOTE — Patient Instructions (Addendum)
recommendations along with daily probiotics, cranberry tablets twice daily as well as utilization of d-mannose were also discussed.  Lastly, I strongly agree with topical estrogen cream for this postmenopausal woman.  She has no risk factors for use of this medication.  I recommend using a pea-sized amount per urethral meatus 3 times a week, Monday Wednesday Friday for UTI prevention.  We discussed the data behind this in terms of reducing the frequency of infections in postmenopausal women.  I refilled this prescription for her at her request.

## 2021-08-10 DIAGNOSIS — M9901 Segmental and somatic dysfunction of cervical region: Secondary | ICD-10-CM | POA: Diagnosis not present

## 2021-08-10 DIAGNOSIS — M5033 Other cervical disc degeneration, cervicothoracic region: Secondary | ICD-10-CM | POA: Diagnosis not present

## 2021-08-10 DIAGNOSIS — M5412 Radiculopathy, cervical region: Secondary | ICD-10-CM | POA: Diagnosis not present

## 2021-08-10 DIAGNOSIS — M9902 Segmental and somatic dysfunction of thoracic region: Secondary | ICD-10-CM | POA: Diagnosis not present

## 2021-08-16 DIAGNOSIS — M5412 Radiculopathy, cervical region: Secondary | ICD-10-CM | POA: Diagnosis not present

## 2021-08-16 DIAGNOSIS — M9901 Segmental and somatic dysfunction of cervical region: Secondary | ICD-10-CM | POA: Diagnosis not present

## 2021-08-16 DIAGNOSIS — M9902 Segmental and somatic dysfunction of thoracic region: Secondary | ICD-10-CM | POA: Diagnosis not present

## 2021-08-16 DIAGNOSIS — M5033 Other cervical disc degeneration, cervicothoracic region: Secondary | ICD-10-CM | POA: Diagnosis not present

## 2021-08-24 DIAGNOSIS — M5033 Other cervical disc degeneration, cervicothoracic region: Secondary | ICD-10-CM | POA: Diagnosis not present

## 2021-08-24 DIAGNOSIS — M9901 Segmental and somatic dysfunction of cervical region: Secondary | ICD-10-CM | POA: Diagnosis not present

## 2021-08-24 DIAGNOSIS — M9902 Segmental and somatic dysfunction of thoracic region: Secondary | ICD-10-CM | POA: Diagnosis not present

## 2021-08-24 DIAGNOSIS — M5412 Radiculopathy, cervical region: Secondary | ICD-10-CM | POA: Diagnosis not present

## 2021-08-31 DIAGNOSIS — M9902 Segmental and somatic dysfunction of thoracic region: Secondary | ICD-10-CM | POA: Diagnosis not present

## 2021-08-31 DIAGNOSIS — M5033 Other cervical disc degeneration, cervicothoracic region: Secondary | ICD-10-CM | POA: Diagnosis not present

## 2021-08-31 DIAGNOSIS — M9901 Segmental and somatic dysfunction of cervical region: Secondary | ICD-10-CM | POA: Diagnosis not present

## 2021-08-31 DIAGNOSIS — M5412 Radiculopathy, cervical region: Secondary | ICD-10-CM | POA: Diagnosis not present

## 2021-09-07 DIAGNOSIS — M5033 Other cervical disc degeneration, cervicothoracic region: Secondary | ICD-10-CM | POA: Diagnosis not present

## 2021-09-07 DIAGNOSIS — M9902 Segmental and somatic dysfunction of thoracic region: Secondary | ICD-10-CM | POA: Diagnosis not present

## 2021-09-07 DIAGNOSIS — M9901 Segmental and somatic dysfunction of cervical region: Secondary | ICD-10-CM | POA: Diagnosis not present

## 2021-09-07 DIAGNOSIS — M5412 Radiculopathy, cervical region: Secondary | ICD-10-CM | POA: Diagnosis not present

## 2021-09-20 NOTE — Progress Notes (Signed)
? ?09/21/21 ?10:59 AM  ? ?Ancil Linsey ?10/17/1950 ?[id number] ? ?Referring provider:  ?Leone Haven, MD ?[address] ?[address] ?[place],  [place] [address] ?Chief Complaint  ?Patient presents with  ? Urinary Tract Infection  ? ? ? ?HPI: ?Shannon [name] is a 71 y.o.female with a personal history of recurrent acute cystitis, who presents today for further evaluation of UTI.  ? ?She had a urinalysis on 07/31/2021 that showed moderate leukocytes but did not end up growing any bacteria.  This was repeated on the 11th which was grossly positive urine culture grew E.coli ESBL multi drug resistant, sensitive to Macrobid and cipro. She was not treated at time of urinalysis I prescribed her Septra during visit on 08/04/2021 , urine culture came back resistant to septra her PCP then switched her antibiotic ciprofloxacin.  ? ?She last had sexual intercourse 2 weeks ago and she felt symptoms of a UTI after this. She took the rest of her Bactrim that I had originally prescribed along with some Azo because it was the weekend and she felt a little relief.  ? ?She has been taking probiotics and has started her estrogen and cranberry tablets.  ? ?Today, she reports very mild symptoms.  Her burning intermittent no flank pain.  No fevers. ? ? ?PMH: ?Past Medical History:  ?Diagnosis Date  ? GERD (gastroesophageal reflux disease)   ? Hyperlipidemia   ? Osteoporosis   ? now has osteopenia  ? Thyroid disease   ? ? ?Surgical History: ?Past Surgical History:  ?Procedure Laterality Date  ? COLOSTOMY  2016  ? HAND SURGERY    ? 40 yrs ago  ? ? ?Home Medications:  ?Allergies as of 09/21/2021   ?No Known Allergies ?  ? ?  ?Medication List  ?  ? ?  ? Accurate as of September 21, 2021 10:59 AM. If you have any questions, ask your nurse or doctor.  ?  ?  ? ?  ? ?STOP taking these medications   ? ?sulfamethoxazole-trimethoprim 800-160 MG tablet ?Commonly known as: BACTRIM DS ?Stopped by: Hollice Espy, MD ?  ? ?  ? ?TAKE these medications   ? ?b  complex vitamins capsule ?Take by mouth. ?  ?CENTRUM SILVER PO ?Take 1 tablet by mouth daily. ?  ?D3 High Potency 125 MCG (5000 UT) capsule ?Generic drug: Cholecalciferol ?  ?estradiol 0.1 MG/GM vaginal cream ?Commonly known as: ESTRACE ?1 gram intravaginally at bedtime daily for 2-3 weeks, then decrease to 3 times per week ?  ?levothyroxine 88 MCG tablet ?Commonly known as: SYNTHROID ?Take 1 tablet (88 mcg total) by mouth daily. ?  ?simvastatin 20 MG tablet ?Commonly known as: ZOCOR ?TAKE ONE TABLET BY MOUTH EVERY EVENING ?  ? ?  ? ? ?Allergies: No Known Allergies ? ?Family History: ?Family History  ?Problem Relation Age of Onset  ? Breast cancer Mother 82  ? Heart disease Father   ? Cancer Father   ?     Prostate  ? Diabetes Father   ?     diagnosed later in life  ? Prostate cancer Father   ? Colon polyps Father 78  ? Parkinson's disease Father   ? Congestive Heart Failure Father   ? Asthma Brother   ? Colon cancer Neg Hx   ? Rectal cancer Neg Hx   ? Stomach cancer Neg Hx   ? Esophageal cancer Neg Hx   ? ? ?Social History:  reports that she quit smoking about 32 years  ago. Her smoking use included cigarettes. She has never used smokeless tobacco. She reports current alcohol use of about 7.0 standard drinks per week. She reports that she does not use drugs. ? ? ?Physical Exam: ?BP 126/79   Pulse 70   Ht 5\' 5"  (1.651 m)   Wt 139 lb (63 kg)   BMI 23.13 kg/m?   ?Constitutional:  Alert and oriented, No acute distress. ?HEENT: [place] AT, moist mucus membranes.  Trachea midline, no masses. ?Cardiovascular: No clubbing, cyanosis, or edema. ?Respiratory: Normal respiratory effort, no increased work of breathing. ?Skin: No rashes, bruises or suspicious lesions. ?Neurologic: Grossly intact, no focal deficits, moving all 4 extremities. ?Psychiatric: Normal mood and affect. ? ?Laboratory Data: ?Lab Results  ?Component Value Date  ? CREATININE 0.70 11/03/2020  ? ? ?Urinalysis ?11-30 Wbcs, nitrite negative, no bacteria   ? ?Pertinent Imaging: ?Results for orders placed or performed in visit on 09/21/21  ?BLADDER SCAN AMB NON-IMAGING  ?Result Value Ref Range  ? Scan Result 25 ml   ? ? ?Assessment & Plan:   ? ?1. Recurrent cystitis ?- She is emptying adequately today  ?- Urinalysis is fairly equivocal and may represent some ongoing underlying bladder inflammation rather than true ongoing infection ?- Urine sent for culture; unfortunately she reports today that she will be leaving town on Sunday for [place] and is worried about getting antibiotics while traveling.  I did go ahead and prescribe her a course of Cipro however under strict instructions to hold this prescription until instructed to take it.  We discussed the black box warning on Cipro including the risk of tendinitis and/or rupture. ?- Recommend she continue topical estrogen cream and  cranberry tablets, probiotics, yogurt that has active lactobacillus culture, and d-mannose  ?- We discussed personal hygiene after using sexual intercourse ?- Discussed that she can use AZO intermittently not everyday ?-We had a lengthy discussion today about the predicament given that her most recent urine culture grew a fairly resistant organism, would prefer to avoid suppressive antibiotics given the limited oral options ?-Preference to continue to use estrogen regularly for at least 2 to 3 months prior to pursuing the above ? ? ? ?I,Kailey Littlejohn,acting as a scribe for Hollice Espy, MD.,have documented all relevant documentation on the behalf of Hollice Espy, MD,as directed by  Hollice Espy, MD while in the presence of Hollice Espy, MD. ? ?I have reviewed the above documentation for accuracy and completeness, and I agree with the above.  ? ?Hollice Espy, MD ? ? ?[facility] ?[address] ?[place], [place] [address] ?[phone]

## 2021-09-21 ENCOUNTER — Other Ambulatory Visit: Payer: Self-pay

## 2021-09-21 ENCOUNTER — Ambulatory Visit: Payer: PPO | Admitting: Urology

## 2021-09-21 ENCOUNTER — Encounter: Payer: Self-pay | Admitting: Urology

## 2021-09-21 VITALS — BP 126/79 | HR 70 | Ht 65.0 in | Wt 139.0 lb

## 2021-09-21 DIAGNOSIS — N3000 Acute cystitis without hematuria: Secondary | ICD-10-CM

## 2021-09-21 LAB — URINALYSIS, COMPLETE
Bilirubin, UA: NEGATIVE
Glucose, UA: NEGATIVE
Ketones, UA: NEGATIVE
Nitrite, UA: NEGATIVE
RBC, UA: NEGATIVE
Specific Gravity, UA: 1.025 (ref 1.005–1.030)
Urobilinogen, Ur: 0.2 mg/dL (ref 0.2–1.0)
pH, UA: 6 (ref 5.0–7.5)

## 2021-09-21 LAB — MICROSCOPIC EXAMINATION: RBC, Urine: NONE SEEN /hpf (ref 0–2)

## 2021-09-21 LAB — BLADDER SCAN AMB NON-IMAGING: Scan Result: 25

## 2021-09-21 MED ORDER — CIPROFLOXACIN HCL 500 MG PO TABS: 500.0000 mg | ORAL_TABLET | Freq: Two times a day (BID) | ORAL | 0 refills | Status: AC

## 2021-09-22 ENCOUNTER — Encounter: Payer: Self-pay | Admitting: Urology

## 2021-09-22 DIAGNOSIS — M5412 Radiculopathy, cervical region: Secondary | ICD-10-CM | POA: Diagnosis not present

## 2021-09-22 DIAGNOSIS — M9901 Segmental and somatic dysfunction of cervical region: Secondary | ICD-10-CM | POA: Diagnosis not present

## 2021-09-22 DIAGNOSIS — M9902 Segmental and somatic dysfunction of thoracic region: Secondary | ICD-10-CM | POA: Diagnosis not present

## 2021-09-22 DIAGNOSIS — M5033 Other cervical disc degeneration, cervicothoracic region: Secondary | ICD-10-CM | POA: Diagnosis not present

## 2021-09-25 LAB — CULTURE, URINE COMPREHENSIVE

## 2021-09-26 ENCOUNTER — Telehealth: Payer: Self-pay

## 2021-09-26 NOTE — Telephone Encounter (Signed)
-----   Message from Hollice Espy, MD sent at 09/25/2021  4:53 PM EST ----- ?Urine culture ended up growing E. coli as well as Enterococcus.  Both of these are fairly low colony count.  If you are still having symptoms, it is okay to go ahead and take the Cipro as we discussed. ? ?Hollice Espy, MD ? ?

## 2021-09-26 NOTE — Telephone Encounter (Signed)
Pt aware and verbalized understanding.  

## 2021-10-06 DIAGNOSIS — M5033 Other cervical disc degeneration, cervicothoracic region: Secondary | ICD-10-CM | POA: Diagnosis not present

## 2021-10-06 DIAGNOSIS — M5412 Radiculopathy, cervical region: Secondary | ICD-10-CM | POA: Diagnosis not present

## 2021-10-06 DIAGNOSIS — M9902 Segmental and somatic dysfunction of thoracic region: Secondary | ICD-10-CM | POA: Diagnosis not present

## 2021-10-06 DIAGNOSIS — M9901 Segmental and somatic dysfunction of cervical region: Secondary | ICD-10-CM | POA: Diagnosis not present

## 2021-10-19 DIAGNOSIS — M5033 Other cervical disc degeneration, cervicothoracic region: Secondary | ICD-10-CM | POA: Diagnosis not present

## 2021-10-19 DIAGNOSIS — M9902 Segmental and somatic dysfunction of thoracic region: Secondary | ICD-10-CM | POA: Diagnosis not present

## 2021-10-19 DIAGNOSIS — M5412 Radiculopathy, cervical region: Secondary | ICD-10-CM | POA: Diagnosis not present

## 2021-10-19 DIAGNOSIS — M9901 Segmental and somatic dysfunction of cervical region: Secondary | ICD-10-CM | POA: Diagnosis not present

## 2021-11-07 ENCOUNTER — Telehealth: Payer: Self-pay | Admitting: Family Medicine

## 2021-11-07 NOTE — Telephone Encounter (Signed)
Patient requesting labs prior to physical on Friday 11/10/21. Needing lab orders. ?

## 2021-11-07 NOTE — Telephone Encounter (Signed)
Labs ordered and patient is scheduled.  Shannon [name],cma  ?

## 2021-11-08 ENCOUNTER — Other Ambulatory Visit (INDEPENDENT_AMBULATORY_CARE_PROVIDER_SITE_OTHER): Payer: PPO

## 2021-11-08 DIAGNOSIS — E785 Hyperlipidemia, unspecified: Secondary | ICD-10-CM | POA: Diagnosis not present

## 2021-11-08 DIAGNOSIS — E039 Hypothyroidism, unspecified: Secondary | ICD-10-CM

## 2021-11-08 LAB — COMPREHENSIVE METABOLIC PANEL
ALT: 42 U/L — ABNORMAL HIGH (ref 0–35)
AST: 40 U/L — ABNORMAL HIGH (ref 0–37)
Albumin: 4.5 g/dL (ref 3.5–5.2)
Alkaline Phosphatase: 55 U/L (ref 39–117)
BUN: 18 mg/dL (ref 6–23)
CO2: 25 mEq/L (ref 19–32)
Calcium: 9.4 mg/dL (ref 8.4–10.5)
Chloride: 101 mEq/L (ref 96–112)
Creatinine, Ser: 0.81 mg/dL (ref 0.40–1.20)
GFR: 73.38 mL/min (ref >60.00)
Glucose, Bld: 89 mg/dL (ref 70–99)
Potassium: 4.1 mEq/L (ref 3.5–5.1)
Sodium: 135 mEq/L (ref 135–145)
Total Bilirubin: 1.1 mg/dL (ref 0.2–1.2)
Total Protein: 7.2 g/dL (ref 6.0–8.3)

## 2021-11-08 LAB — LIPID PANEL
Cholesterol: 159 mg/dL (ref 0–200)
HDL: 64.1 mg/dL (ref >39.00)
LDL Cholesterol: 67 mg/dL (ref 0–99)
NonHDL: 94.46
Total CHOL/HDL Ratio: 2
Triglycerides: 136 mg/dL (ref 0.0–149.0)
VLDL: 27.2 mg/dL (ref 0.0–40.0)

## 2021-11-08 LAB — T4, FREE: Free T4: 1.02 ng/dL (ref 0.60–1.60)

## 2021-11-08 LAB — TSH: TSH: 3.07 u[IU]/mL (ref 0.35–5.50)

## 2021-11-09 DIAGNOSIS — M5033 Other cervical disc degeneration, cervicothoracic region: Secondary | ICD-10-CM | POA: Diagnosis not present

## 2021-11-09 DIAGNOSIS — M9902 Segmental and somatic dysfunction of thoracic region: Secondary | ICD-10-CM | POA: Diagnosis not present

## 2021-11-09 DIAGNOSIS — M9901 Segmental and somatic dysfunction of cervical region: Secondary | ICD-10-CM | POA: Diagnosis not present

## 2021-11-09 DIAGNOSIS — M5412 Radiculopathy, cervical region: Secondary | ICD-10-CM | POA: Diagnosis not present

## 2021-11-10 ENCOUNTER — Encounter: Payer: Self-pay | Admitting: Family Medicine

## 2021-11-10 ENCOUNTER — Ambulatory Visit (INDEPENDENT_AMBULATORY_CARE_PROVIDER_SITE_OTHER): Payer: PPO | Admitting: Family Medicine

## 2021-11-10 VITALS — BP 120/80 | HR 68 | Temp 98.6°F | Ht 65.0 in | Wt 145.8 lb

## 2021-11-10 DIAGNOSIS — Z1231 Encounter for screening mammogram for malignant neoplasm of breast: Secondary | ICD-10-CM | POA: Diagnosis not present

## 2021-11-10 DIAGNOSIS — Z780 Asymptomatic menopausal state: Secondary | ICD-10-CM

## 2021-11-10 DIAGNOSIS — R7989 Other specified abnormal findings of blood chemistry: Secondary | ICD-10-CM | POA: Diagnosis not present

## 2021-11-10 DIAGNOSIS — E039 Hypothyroidism, unspecified: Secondary | ICD-10-CM | POA: Diagnosis not present

## 2021-11-10 DIAGNOSIS — Z0001 Encounter for general adult medical examination with abnormal findings: Secondary | ICD-10-CM

## 2021-11-10 DIAGNOSIS — M2061 Acquired deformities of toe(s), unspecified, right foot: Secondary | ICD-10-CM

## 2021-11-10 DIAGNOSIS — E785 Hyperlipidemia, unspecified: Secondary | ICD-10-CM | POA: Diagnosis not present

## 2021-11-10 DIAGNOSIS — M2060 Acquired deformities of toe(s), unspecified, unspecified foot: Secondary | ICD-10-CM | POA: Insufficient documentation

## 2021-11-10 MED ORDER — TETANUS-DIPHTHERIA TOXOIDS TD 5-2 LFU IM INJ: 0.5000 mL | INJECTION | Freq: Once | INTRAMUSCULAR | 0 refills | Status: AC

## 2021-11-10 NOTE — Patient Instructions (Signed)
Nice to see you. ?Please continue work on diet and exercise. ?Please reduce your alcohol intake. ?We will recheck your liver enzymes in 1 month. ?Please call [phone] to schedule your mammogram and bone density scan for the end of June. ?Please get your tetanus vaccine at the pharmacy. ?

## 2021-11-10 NOTE — Assessment & Plan Note (Signed)
Discussed that she should continue the toe spacer.  Advised against having any surgery given that she is not having any discomfort from this. ?

## 2021-11-10 NOTE — Assessment & Plan Note (Signed)
Physical exam completed.  I encouraged healthy diet and exercise.  Discussed reduction in alcohol intake particularly in the setting of minimally elevated LFTs.  She will get her tetanus vaccine at the pharmacy.  Mammogram and bone density scan ordered.  Patient was given the phone number to call to schedule these for the end of June.  Lab work reviewed with patient. ?

## 2021-11-10 NOTE — Progress Notes (Signed)
?Tommi Rumps, MD ?Phone: [phone] ? ?Shannon [name] is a 71 y.o. female who presents today for CPE. ? ?Diet: 3 meals per day, lots of asian food, not a lot of junk, plenty of vegetables, no soda or sweet tea ?Exercise: walking daily ?Pap smear: aged out, no prior abnormals ?Colonoscopy: 12/01/19, 7 year recall, singles tubular adenoma, single hyperplastic polyp ?Mammogram: 01/12/21 negative ?Family history- ? Colon cancer: no ? Breast cancer: mother ? Ovarian cancer: no ?Menses: postmenopausal ?Vaccines-  ? Flu: UTD ? Tetanus: due ? Shingles: UTD ? COVID19: x4 ? Pneumonia: UTD ?HIV screening: aged out ?Hep C Screening: yes ?Tobacco use: no ?Alcohol use: 2/day ?Illicit Drug use: no ?Dentist: yes ?Ophthalmology: yes ? ?Patient's lab work revealed minimally elevated LFTs.  She does report being on antibiotics for time since November.  She does drink 2 alcoholic beverages daily. ? 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  There is a slight callus.  She has seen podiatry for this.  She notes there is no pain associated with this.  She typically wears a toe spacer. ? ? ?Active Ambulatory Problems  ?  Diagnosis Date Noted  ? Hyperlipidemia 05/20/2013  ? Hypothyroidism 05/20/2013  ? Osteoporosis 05/20/2013  ? Thyroid nodule 05/20/2013  ? Encounter for general adult medical examination with abnormal findings 11/24/2013  ? Hair loss 06/02/2014  ? Erythematous rash 10/15/2018  ? Carpal tunnel syndrome 02/10/2013  ? Eczema of scalp 04/03/2017  ? Elevated TSH 02/10/2013  ? Ganglion cyst of finger 09/24/2018  ? Stress incontinence 11/08/2020  ? Stress 11/08/2020  ? Skin exam, screening for cancer 11/08/2020  ? Cystitis 07/31/2021  ? LFTs abnormal 11/10/2021  ? 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 ?Resolved Ambulatory Problems  ?  Diagnosis Date Noted  ? Need for prophylactic vaccination and inoculation against influenza  05/20/2013  ? UTI (urinary tract infection) 11/09/2013  ? Screening for breast cancer 11/24/2013  ? Encounter for general adult medical examination with abnormal findings 09/10/2017  ? ?Past Medical History:  ?Diagnosis Date  ? GERD (gastroesophageal reflux disease)   ? Thyroid disease   ? ? ?Family History  ?Problem Relation Age of Onset  ? Breast cancer Mother 6  ? Heart disease Father   ? Cancer Father   ?     Prostate  ? Diabetes Father   ?     diagnosed later in life  ? Prostate cancer Father   ? Colon polyps Father 56  ? Parkinson's disease Father   ? Congestive Heart Failure Father   ? Asthma Brother   ? Colon cancer Neg Hx   ? Rectal cancer Neg Hx   ? Stomach cancer Neg Hx   ? Esophageal cancer Neg Hx   ? ? ?Social History  ? ?Socioeconomic History  ? Marital status: Married  ?  Spouse name: Not on file  ? Number of children: Not on file  ? Years of education: Not on file  ? Highest education level: Not on file  ?Occupational History  ? Not on file  ?Tobacco Use  ? Smoking status: Former  ?  Types: Cigarettes  ?  Quit date: 01/18/1989  ?  Years since quitting: 32.8  ? Smokeless tobacco: Never  ?Vaping Use  ? Vaping Use: Never used  ?Substance and Sexual Activity  ? Alcohol use: Yes  ?  Alcohol/week: 7.0 standard drinks  ?  Types: 7 Glasses of wine per week  ? Drug use: No  ? Sexual activity: Yes  ?  Partners: Male  ?Other Topics Concern  ? Not on file  ?Social History Narrative  ? Lives in [place] with husband. 28YO son, lives in [place], Ship [place]. Dog in home. Born in [place], raised in [place]. [place]. Previously lived in [place].  ?   ? Diet - regular diet  ?   ? Exercise - walking  ? Caffeine: coffee in the morning; cup of hot tea  ?   ? Hobbies - yardwork  ?   ? Work - [employer] manager in [employer]  ? ?Social Determinants of Health  ? ?Financial Resource Strain: Not on file  ?Food Insecurity: Not on file  ?Transportation Needs: Not on file  ?Physical Activity: Not on file  ?Stress: Not on file  ?Social  Connections: Not on file  ?Intimate Partner Violence: Not on file  ? ? ?ROS ? ?General:  Negative for nexplained weight loss, fever ?Skin: Negative for new or changing mole, sore that won't heal ?HEENT: Negative for trouble hearing, trouble seeing, ringing in ears, mouth sores, hoarseness, change in voice, dysphagia. ?CV:  Negative for chest pain, dyspnea, edema, palpitations ?Resp: Negative for cough, dyspnea, hemoptysis ?GI: Negative for nausea, vomiting, diarrhea, constipation, abdominal pain, melena, hematochezia. ?GU: Negative for dysuria, incontinence, urinary hesitance, hematuria, vaginal or penile discharge, polyuria, sexual difficulty, lumps in testicle or breasts ?MSK: Negative for muscle cramps or aches, joint pain or swelling ?Neuro: Negative for headaches, weakness, numbness, dizziness, passing out/fainting ?Psych: Negative for depression, anxiety, memory problems ? ?Objective ? ?Physical Exam ?Vitals:  ? 11/10/21 0901  ?BP: 120/80  ?Pulse: 68  ?Temp: 98.6 ?F (37 ?C)  ?SpO2: 98%  ? ? ?BP Readings from Last 3 Encounters:  ?11/10/21 120/80  ?09/21/21 126/79  ?08/04/21 122/74  ? ?Wt Readings from Last 3 Encounters:  ?11/10/21 145 lb 12.8 oz (66.1 kg)  ?09/21/21 139 lb (63 kg)  ?08/04/21 147 lb (66.7 kg)  ? ? ?Physical Exam ?Constitutional:   ?   General: She is not in acute distress. ?   Appearance: She is not diaphoretic.  ?HENT:  ?   Mouth/Throat:  ?   Mouth: Mucous membranes are moist.  ?   Pharynx: Oropharynx is clear.  ?Eyes:  ?   Conjunctiva/sclera: Conjunctivae normal.  ?   Pupils: Pupils are equal, round, and reactive to light.  ?Cardiovascular:  ?   Rate and Rhythm: Normal rate and regular rhythm.  ?   Heart sounds: Normal heart sounds.  ?Pulmonary:  ?   Effort: Pulmonary effort is normal.  ?   Breath sounds: Normal breath sounds.  ?Abdominal:  ?   General: Bowel sounds are normal. There is no distension.  ?   Palpations: Abdomen is soft.  ?   Tenderness: There is no abdominal tenderness.   ?Musculoskeletal:  ?   Right lower leg: No edema.  ?   Left lower leg: No edema.  ?Lymphadenopathy:  ?   Cervical: No cervical adenopathy.  ?Skin: ?   General: Skin is warm and dry.  ?Neurological:  ?   Mental Status: She is alert.  ?Psychiatric:     ?   Mood and Affect: Mood normal.  ? ? ? ?Assessment/Plan:  ? ?Problem List Items Addressed This Visit   ? ? Encounter for general adult medical examination with abnormal findings - Primary  ?  Physical exam completed.  I encouraged healthy  diet and exercise.  Discussed reduction in alcohol intake particularly in the setting of minimally elevated LFTs.  She will get her tetanus vaccine at the pharmacy.  Mammogram and bone density scan ordered.  Patient was given the phone number to call to schedule these for the end of June.  Lab work reviewed with patient. ? ?  ?  ? Hyperlipidemia  ? Relevant Orders  ? Comp Met (CMET) (Completed)  ? Lipid panel (Completed)  ? Hypothyroidism  ? Relevant Orders  ? TSH (Completed)  ? T4, free (Completed)  ? LFTs abnormal  ?  Minimal elevation.  Discussed that certainly the antibiotics could have played a role in this though her alcohol intake is likely playing a role as well.  Discussed reduction of her alcohol intake.  We will plan to recheck in 1 month. ? ?  ?  ? Relevant Orders  ? Hepatic function panel  ? Toe deformity  ?  Discussed that she should continue the toe spacer.  Advised against having any surgery given that she is not having any discomfort from this. ? ?  ?  ? ?Other Visit Diagnoses   ? ? Encounter for screening mammogram for malignant neoplasm of breast      ? Relevant Orders  ? MM 3D SCREEN BREAST BILATERAL  ? Postmenopausal estrogen deficiency      ? Relevant Orders  ? DG Bone Density  ? ?  ? ? ?Return in about 1 month (around 12/10/2021) for labs, CPE 1 year. ? ?This visit occurred during the SARS-CoV-2 public health emergency.  Safety protocols were in place, including screening questions prior to the visit,  additional usage of staff PPE, and extensive cleaning of exam room while observing appropriate contact time as indicated for disinfecting solutions.  ? ? ?Tommi Rumps, MD ?[place] ? ?

## 2021-11-10 NOTE — Assessment & Plan Note (Signed)
Minimal elevation.  Discussed that certainly the antibiotics could have played a role in this though her alcohol intake is likely playing a role as well.  Discussed reduction of her alcohol intake.  We will plan to recheck in 1 month. ?

## 2021-11-17 ENCOUNTER — Ambulatory Visit (INDEPENDENT_AMBULATORY_CARE_PROVIDER_SITE_OTHER): Payer: PPO

## 2021-11-17 VITALS — Ht 65.0 in | Wt 145.0 lb

## 2021-11-17 DIAGNOSIS — Z0000 Encounter for general adult medical examination without abnormal findings: Secondary | ICD-10-CM | POA: Diagnosis not present

## 2021-11-17 NOTE — Patient Instructions (Addendum)
?  Shannon [name] , ?Thank you for taking time to come for your Medicare Wellness Visit. I appreciate your ongoing commitment to your health goals. Please review the following plan we discussed and let me know if I can assist you in the future.  ? ?These are the goals we discussed: ? Goals   ? ?  ? Patient Stated  ?   Maintain Healthy Lifestyle (pt-stated)   ?   Stay active ?Healthy diet ?  ? ?  ?  ?This is a list of the screening recommended for you and due dates:  ?Health Maintenance  ?Topic Date Due  ? Tetanus Vaccine  05/20/2021  ? COVID-19 Vaccine (5 - Booster for Pfizer series) 11/26/2021*  ? Mammogram  01/12/2022  ? Flu Shot  02/20/2022  ? Colon Cancer Screening  12/01/2026  ? Pneumonia Vaccine  Completed  ? DEXA scan (bone density measurement)  Completed  ? Hepatitis C Screening: USPSTF Recommendation to screen - Ages 73-79 yo.  Completed  ? Zoster (Shingles) Vaccine  Completed  ? HPV Vaccine  Aged Out  ?*Topic was postponed. The date shown is not the original due date.  ?  ?

## 2021-11-17 NOTE — Progress Notes (Signed)
Subjective:   RAELEE [name] is a 71 y.o. female who presents for Medicare Annual (Subsequent) preventive examination.  Review of Systems    No ROS.  Medicare Wellness Virtual Visit.  Visual/audio telehealth visit, UTA vital signs.   See social history for additional risk factors.   Cardiac Risk Factors include: advanced age (>71men, >10 women)     Objective:    Today's Vitals   11/17/21 1520  Weight: 145 lb (65.8 kg)  Height: 5\' 5"  (1.651 m)   Body mass index is 24.13 kg/m.     11/17/2021    3:39 PM 02/15/2020    1:43 PM  Advanced Directives  Does Patient Have a Medical Advance Directive? Yes No  Type of Estate agent of [place];Living will   Does patient want to make changes to medical advance directive? No - Patient declined   Copy of Healthcare Power of Attorney in Chart? No - copy requested   Would patient like information on creating a medical advance directive?  No - Patient declined    Current Medications (verified) Outpatient Encounter Medications as of 11/17/2021  Medication Sig   b complex vitamins capsule Take by mouth.   Calcium Carb-Cholecalciferol (CALCIUM 600+D HIGH POTENCY) 600-10 MG-MCG TABS    Cholecalciferol (D3 HIGH POTENCY) 125 MCG (5000 UT) capsule    Cranberry-Vitamin C-Vitamin E (CRANBERRY PLUS VITAMIN C) 4200-20-3 MG-MG-UNIT CAPS    estradiol (ESTRACE) 0.1 MG/GM vaginal cream 1 gram intravaginally at bedtime daily for 2-3 weeks, then decrease to 3 times per week   levothyroxine (SYNTHROID) 88 MCG tablet Take 1 tablet (88 mcg total) by mouth daily.   Multiple Vitamins-Minerals (CENTRUM SILVER PO) Take 1 tablet by mouth daily.   Probiotic Product (PROBIOTIC-10 ULTIMATE PO) Take by mouth.   simvastatin (ZOCOR) 20 MG tablet TAKE ONE TABLET BY MOUTH EVERY EVENING   No facility-administered encounter medications on file as of 11/17/2021.    Allergies (verified) Patient has no known allergies.   History: Past Medical  History:  Diagnosis Date   GERD (gastroesophageal reflux disease)    Hyperlipidemia    Osteoporosis    now has osteopenia   Thyroid disease    Past Surgical History:  Procedure Laterality Date   HAND SURGERY     40 yrs ago   Family History  Problem Relation Age of Onset   Breast cancer Mother 79   Heart disease Father    Cancer Father        Prostate   Diabetes Father        diagnosed later in life   Prostate cancer Father    Colon polyps Father 22   Parkinson's disease Father    Congestive Heart Failure Father    Asthma Brother    Colon cancer Neg Hx    Rectal cancer Neg Hx    Stomach cancer Neg Hx    Esophageal cancer Neg Hx    Social History   Socioeconomic History   Marital status: Married    Spouse name: Not on file   Number of children: Not on file   Years of education: Not on file   Highest education level: Not on file  Occupational History   Not on file  Tobacco Use   Smoking status: Former    Types: Cigarettes    Quit date: 01/18/1989    Years since quitting: 32.8   Smokeless tobacco: Never  Vaping Use   Vaping Use: Never used  Substance and Sexual Activity  Alcohol use: Yes    Alcohol/week: 7.0 standard drinks    Types: 7 Glasses of wine per week   Drug use: No   Sexual activity: Yes    Partners: Male  Other Topics Concern   Not on file  Social History Narrative   Lives in [place] with husband. 23YO son, lives in [place], Consulting civil engineer. Dog in home. Born in [place], raised in [place]. College [place]. Previously lived in [place].      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[employer]   Social Determinants of Corporate investment banker Strain: Low Risk    Difficulty of Paying Living Expenses: Not hard at all  Food Insecurity: No Food Insecurity   Worried About Programme researcher, broadcasting/film/video in the Last Year: Never true   Barista in the Last Year:  Never true  Transportation Needs: No Transportation Needs   Lack of Transportation (Medical): No   Lack of Transportation (Non-Medical): No  Physical Activity: Not on file  Stress: No Stress Concern Present   Feeling of Stress : Not at all  Social Connections: Unknown   Frequency of Communication with Friends and Family: Not on file   Frequency of Social Gatherings with Friends and Family: Not on file   Attends Religious Services: Not on file   Active Member of Clubs or Organizations: Not on file   Attends Banker Meetings: Not on file   Marital Status: Married    Tobacco Counseling Counseling given: Not Answered   Clinical Intake:  Pre-visit preparation completed: Yes        Diabetes: No  How often do you need to have someone help you when you read instructions, pamphlets, or other written materials from your doctor or pharmacy?: 1 - Never  Interpreter Needed?: No      Activities of Daily Living    11/17/2021    3:23 PM  In your present state of health, do you have any difficulty performing the following activities:  Hearing? 0  Vision? 0  Difficulty concentrating or making decisions? 0  Walking or climbing stairs? 0  Dressing or bathing? 0  Doing errands, shopping? 0  Preparing Food and eating ? N  Using the Toilet? N  In the past six months, have you accidently leaked urine? N  Do you have problems with loss of bowel control? N  Managing your Medications? N  Managing your Finances? N  Housekeeping or managing your Housekeeping? N    Patient Care Team: Glori Luis, MD as PCP - General (Family Medicine)  Indicate any recent Medical Services you may have received from other than [facility] providers in the past year (date may be approximate).     Assessment:   This is a routine wellness examination for Shannon [name].  Virtual Visit via Telephone Note  I connected with  Shannon [name] on 11/17/21 at  3:15 PM EDT by telephone and verified that  I am speaking with the correct person using two identifiers.  Persons participating in the virtual visit: patient/Nurse Health Advisor   I discussed the limitations of performing an evaluation and management service by telehealth. The patient expressed understanding and agreed to proceed. Some vital signs may be absent or patient reported.   Hearing/Vision screen Hearing Screening -  Comments:: Patient is able to hear conversational tones without difficulty. No issues reported.  Vision Screening - Comments:: Followed by Hulen Luster  They have seen their ophthalmologist in the last 12 months.   Dietary issues and exercise activities discussed: Current Exercise Habits: Home exercise routine, Type of exercise: treadmill;walking, Intensity: Moderate Healthy diet Good water intake   Goals Addressed               This Visit's Progress     Patient Stated     Maintain Healthy Lifestyle (pt-stated)        Stay active Healthy diet       Depression Screen    11/17/2021    3:38 PM 11/10/2021    9:05 AM 07/31/2021   10:51 AM 11/08/2020   10:29 AM 02/15/2020    1:44 PM 10/14/2019    8:25 AM 09/10/2017    3:50 PM  PHQ 2/9 Scores  PHQ - 2 Score 0 0 0 0 0 0 0    Fall Risk    11/17/2021    3:43 PM 11/10/2021    9:05 AM 07/31/2021   10:51 AM 11/08/2020   10:29 AM 02/15/2020    1:44 PM  Fall Risk   Falls in the past year? 0 1 0 0 0  Number falls in past yr: 0 0  0 0  Injury with Fall?  0     Risk for fall due to :  No Fall Risks No Fall Risks    Follow up Falls evaluation completed Falls evaluation completed Falls evaluation completed Falls evaluation completed Falls evaluation completed    FALL RISK PREVENTION PERTAINING TO THE HOME: Home free of loose throw rugs in walkways, pet beds, electrical cords, etc? Yes  Adequate lighting in your home to reduce risk of falls? Yes   ASSISTIVE DEVICES UTILIZED TO PREVENT FALLS: Life alert? No  Use of a cane, walker or w/c? No   TIMED UP  AND GO: Was the test performed? No .   Cognitive Function: Patient is alert and oriented x3.     02/15/2020    1:54 PM  MMSE - Mini Mental State Exam  Not completed: Unable to complete        Immunizations Immunization History  Administered Date(s) Administered   Influenza, High Dose Seasonal PF 05/03/2018   Influenza,inj,Quad PF,6+ Mos 05/20/2013, 06/02/2014   Influenza-Unspecified 05/20/2016, 06/28/2017, 04/17/2019, 05/08/2020, 05/09/2021   PFIZER(Purple Top)SARS-COV-2 Vaccination 08/12/2019, 09/02/2019, 03/18/2020, 05/09/2021   Pneumococcal Conjugate-13 09/10/2017   Pneumococcal Polysaccharide-23 10/13/2018   Tdap 05/21/2011   Zoster Recombinat (Shingrix) 09/10/2017, 06/06/2018   Zoster, Live 05/20/2012, 08/13/2012    TDAP status: Due, Education has been provided regarding the importance of this vaccine. Advised may receive this vaccine at local pharmacy or Health Dept. Aware to provide a copy of the vaccination record if obtained from local pharmacy or Health Dept. Verbalized acceptance and understanding.  Screening Tests Health Maintenance  Topic Date Due   TETANUS/TDAP  05/20/2021   COVID-19 Vaccine (5 - Booster for Pfizer series) 11/26/2021 (Originally 07/04/2021)   MAMMOGRAM  01/12/2022   INFLUENZA VACCINE  02/20/2022   COLONOSCOPY (Pts 45-63yrs Insurance coverage will need to be confirmed)  12/01/2026   Pneumonia Vaccine 45+ Years old  Completed   DEXA SCAN  Completed   Hepatitis C Screening  Completed   Zoster Vaccines- Shingrix  Completed   HPV VACCINES  Aged Out   Health Maintenance Health Maintenance Due  Topic Date Due  TETANUS/TDAP  05/20/2021   Lung Cancer Screening: (Low Dose CT Chest recommended if Age 10-80 years, 30 pack-year currently smoking OR have quit w/in 15years.) does not qualify.   Vision Screening: Recommended annual ophthalmology exams for early detection of glaucoma and other disorders of the eye.  Dental Screening: Recommended  annual dental exams for proper oral hygiene  Community Resource Referral / Chronic Care Management: CRR required this visit?  No   CCM required this visit?  No      Plan:   Keep all routine maintenance appointments.   I have personally reviewed and noted the following in the patient's chart:   Medical and social history Use of alcohol, tobacco or illicit drugs  Current medications and supplements including opioid prescriptions.  Functional ability and status Nutritional status Physical activity Advanced directives List of other physicians Hospitalizations, surgeries, and ER visits in previous 12 months Vitals Screenings to include cognitive, depression, and falls Referrals and appointments  In addition, I have reviewed and discussed with patient certain preventive protocols, quality metrics, and best practice recommendations. A written personalized care plan for preventive services as well as general preventive health recommendations were provided to patient.     Ashok Pall, LPN   1/61/0960

## 2021-11-28 ENCOUNTER — Ambulatory Visit: Payer: PPO | Admitting: Physician Assistant

## 2021-11-28 ENCOUNTER — Ambulatory Visit: Payer: PPO | Admitting: Urology

## 2021-11-28 ENCOUNTER — Encounter: Payer: Self-pay | Admitting: Physician Assistant

## 2021-11-28 VITALS — BP 111/70 | HR 84 | Ht 65.0 in | Wt 140.0 lb

## 2021-11-28 DIAGNOSIS — N3090 Cystitis, unspecified without hematuria: Secondary | ICD-10-CM

## 2021-11-28 LAB — URINALYSIS, COMPLETE
Bilirubin, UA: NEGATIVE
Nitrite, UA: POSITIVE — AB
Specific Gravity, UA: 1.02 (ref 1.005–1.030)
Urobilinogen, Ur: 2 mg/dL — ABNORMAL HIGH (ref 0.2–1.0)
pH, UA: 5 (ref 5.0–7.5)

## 2021-11-28 LAB — MICROSCOPIC EXAMINATION: WBC, UA: >30 /hpf — AB (ref 0–5)

## 2021-11-28 MED ORDER — CIPROFLOXACIN HCL 250 MG PO TABS: 250.0000 mg | ORAL_TABLET | Freq: Two times a day (BID) | ORAL | 0 refills | Status: AC

## 2021-11-28 NOTE — Progress Notes (Signed)
? ?11/28/2021 ?3:57 PM  ? ?Ancil Linsey ?09/14/1950 ?[id number] ? ?CC: ?Chief Complaint  ?Patient presents with  ? Recurrent UTI  ? ?HPI: ?Shannon [name] is a 71 y.o. female with PMH recurrent UTI who presents today for evaluation of possible UTI.  ? ?Today she reports an approximate 2-day history of dysuria, urgency, frequency, and increased stress incontinence over baseline.  She has been taking Azo.  She denies fever, chills, nausea, vomiting, and gross hematuria. ? ?She is taking daily probiotics, cranberry supplements, and estrogen cream for UTI prevention. ? ?She was previously prescribed Cipro by Dr. Erlene Quan and states that she prefers this antibiotic.  She has previously been prescribed Macrobid and felt that it was not effective for her. ? ?In-office UA today positive for orange color and otherwise pan positive consistent with likely Azo contamination; urine microscopy with >30 WBCs/HPF and moderate bacteria. ? ?PMH: ?Past Medical History:  ?Diagnosis Date  ? GERD (gastroesophageal reflux disease)   ? Hyperlipidemia   ? Osteoporosis   ? now has osteopenia  ? Thyroid disease   ? ? ?Surgical History: ?Past Surgical History:  ?Procedure Laterality Date  ? HAND SURGERY    ? 40 yrs ago  ? ? ?Home Medications:  ?Allergies as of 11/28/2021   ?No Known Allergies ?  ? ?  ?Medication List  ?  ? ?  ? Accurate as of Nov 28, 2021  3:57 PM. If you have any questions, ask your nurse or doctor.  ?  ?  ? ?  ? ?b complex vitamins capsule ?Take by mouth. ?  ?Calcium Carb-Cholecalciferol 600-10 MG-MCG Tabs ?  ?CENTRUM SILVER PO ?Take 1 tablet by mouth daily. ?  ?ciprofloxacin 250 MG tablet ?Commonly known as: CIPRO ?Take 1 tablet (250 mg total) by mouth 2 (two) times daily for 5 days. ?Started by: Debroah Loop, PA-C ?  ?Cranberry Plus Vitamin C 4200-20-3 MG-MG-UNIT Caps ?Generic drug: Cranberry-Vitamin C-Vitamin E ?  ?D3 High Potency 125 MCG (5000 UT) capsule ?Generic drug: Cholecalciferol ?  ?estradiol 0.1 MG/GM  vaginal cream ?Commonly known as: ESTRACE ?1 gram intravaginally at bedtime daily for 2-3 weeks, then decrease to 3 times per week ?  ?levothyroxine 88 MCG tablet ?Commonly known as: SYNTHROID ?Take 1 tablet (88 mcg total) by mouth daily. ?  ?PROBIOTIC-10 ULTIMATE PO ?Take by mouth. ?  ?simvastatin 20 MG tablet ?Commonly known as: ZOCOR ?TAKE ONE TABLET BY MOUTH EVERY EVENING ?  ? ?  ? ? ?Allergies:  ?No Known Allergies ? ?Family History: ?Family History  ?Problem Relation Age of Onset  ? Breast cancer Mother 86  ? Heart disease Father   ? Cancer Father   ?     Prostate  ? Diabetes Father   ?     diagnosed later in life  ? Prostate cancer Father   ? Colon polyps Father 14  ? Parkinson's disease Father   ? Congestive Heart Failure Father   ? Asthma Brother   ? Colon cancer Neg Hx   ? Rectal cancer Neg Hx   ? Stomach cancer Neg Hx   ? Esophageal cancer Neg Hx   ? ? ?Social History:  ? reports that she quit smoking about 32 years ago. Her smoking use included cigarettes. She has never used smokeless tobacco. She reports current alcohol use of about 7.0 standard drinks per week. She reports that she does not use drugs. ? ?Physical Exam: ?BP 111/70   Pulse 84   Ht '5\' 5"'$  (  1.651 m)   Wt 140 lb (63.5 kg)   BMI 23.30 kg/m?   ?Constitutional:  Alert and oriented, no acute distress, nontoxic appearing ?HEENT: [place], AT ?Cardiovascular: No clubbing, cyanosis, or edema ?Respiratory: Normal respiratory effort, no increased work of breathing ?Skin: No rashes, bruises or suspicious lesions ?Neurologic: Grossly intact, no focal deficits, moving all 4 extremities ?Psychiatric: Normal mood and affect ? ?Laboratory Data: ?Results for orders placed or performed in visit on 11/28/21  ?Microscopic Examination  ? Urine  ?Result Value Ref Range  ? WBC, UA >30 (A) 0 - 5 /hpf  ? RBC 0-2 0 - 2 /hpf  ? Epithelial Cells (non renal) 0-10 0 - 10 /hpf  ? Bacteria, UA Moderate (A) None seen/Few  ?Urinalysis, Complete  ?Result Value Ref Range  ?  Specific Gravity, UA 1.020 1.005 - 1.030  ? pH, UA 5.0 5.0 - 7.5  ? Color, UA Orange Yellow  ? Appearance Ur Clear Clear  ? Leukocytes,UA 3+ (A) Negative  ? Protein,UA 2+ (A) Negative/Trace  ? Glucose, UA Trace (A) Negative  ? Ketones, UA Trace (A) Negative  ? RBC, UA Trace (A) Negative  ? Bilirubin, UA Negative Negative  ? Urobilinogen, Ur 2.0 (H) 0.2 - 1.0 mg/dL  ? Nitrite, UA Positive (A) Negative  ? Microscopic Examination See below:   ? ?Assessment & Plan:   ?1. Recurrent cystitis ?UA today notable for pyuria and bacteriuria.  Will start empiric Cipro and send for culture for further evaluation.  Encouraged her to continue cranberry supplements, probiotics, and vaginal estrogen cream for UTI prevention.  We discussed that it can take several months of consistent use of vaginal estrogen cream for it to make a difference in UTI frequency.  She expressed understanding. ?- Urinalysis, Complete ?- ciprofloxacin (CIPRO) 250 MG tablet; Take 1 tablet (250 mg total) by mouth 2 (two) times daily for 5 days.  Dispense: 10 tablet; Refill: 0 ?- CULTURE, URINE COMPREHENSIVE ? ?Return if symptoms worsen or fail to improve. ? ?Debroah Loop, PA-C ? ?[facility] ?[address], [address] ?[place], [place] [address] ?[phone]   ?

## 2021-12-02 LAB — CULTURE, URINE COMPREHENSIVE

## 2021-12-07 DIAGNOSIS — M9902 Segmental and somatic dysfunction of thoracic region: Secondary | ICD-10-CM | POA: Diagnosis not present

## 2021-12-07 DIAGNOSIS — M5412 Radiculopathy, cervical region: Secondary | ICD-10-CM | POA: Diagnosis not present

## 2021-12-07 DIAGNOSIS — M9901 Segmental and somatic dysfunction of cervical region: Secondary | ICD-10-CM | POA: Diagnosis not present

## 2021-12-07 DIAGNOSIS — M5033 Other cervical disc degeneration, cervicothoracic region: Secondary | ICD-10-CM | POA: Diagnosis not present

## 2021-12-11 ENCOUNTER — Other Ambulatory Visit (INDEPENDENT_AMBULATORY_CARE_PROVIDER_SITE_OTHER): Payer: PPO

## 2021-12-11 DIAGNOSIS — R7989 Other specified abnormal findings of blood chemistry: Secondary | ICD-10-CM | POA: Diagnosis not present

## 2021-12-11 LAB — HEPATIC FUNCTION PANEL
ALT: 37 U/L — ABNORMAL HIGH (ref 0–35)
AST: 44 U/L — ABNORMAL HIGH (ref 0–37)
Albumin: 4.7 g/dL (ref 3.5–5.2)
Alkaline Phosphatase: 54 U/L (ref 39–117)
Bilirubin, Direct: 0.2 mg/dL (ref 0.0–0.3)
Total Bilirubin: 0.9 mg/dL (ref 0.2–1.2)
Total Protein: 7.4 g/dL (ref 6.0–8.3)

## 2021-12-19 ENCOUNTER — Telehealth: Payer: Self-pay | Admitting: Family Medicine

## 2021-12-19 NOTE — Telephone Encounter (Signed)
Pt returning call

## 2021-12-20 ENCOUNTER — Other Ambulatory Visit: Payer: Self-pay | Admitting: Family Medicine

## 2021-12-20 DIAGNOSIS — R7989 Other specified abnormal findings of blood chemistry: Secondary | ICD-10-CM

## 2021-12-20 NOTE — Telephone Encounter (Signed)
Called the patient and gave lab results, see results note.  ng

## 2021-12-25 ENCOUNTER — Other Ambulatory Visit: Payer: Self-pay | Admitting: Internal Medicine

## 2021-12-25 DIAGNOSIS — E039 Hypothyroidism, unspecified: Secondary | ICD-10-CM

## 2022-01-01 ENCOUNTER — Ambulatory Visit: Payer: PPO | Admitting: Internal Medicine

## 2022-01-01 ENCOUNTER — Ambulatory Visit
Admission: RE | Admit: 2022-01-01 | Discharge: 2022-01-01 | Disposition: A | Discharge status: Home / Self Care | Payer: PPO | Source: Ambulatory Visit | Attending: Family Medicine | Admitting: Family Medicine

## 2022-01-01 DIAGNOSIS — R7989 Other specified abnormal findings of blood chemistry: Secondary | ICD-10-CM | POA: Insufficient documentation

## 2022-01-01 DIAGNOSIS — R945 Abnormal results of liver function studies: Secondary | ICD-10-CM | POA: Diagnosis not present

## 2022-01-03 DIAGNOSIS — M5412 Radiculopathy, cervical region: Secondary | ICD-10-CM | POA: Diagnosis not present

## 2022-01-03 DIAGNOSIS — M9901 Segmental and somatic dysfunction of cervical region: Secondary | ICD-10-CM | POA: Diagnosis not present

## 2022-01-03 DIAGNOSIS — M5033 Other cervical disc degeneration, cervicothoracic region: Secondary | ICD-10-CM | POA: Diagnosis not present

## 2022-01-03 DIAGNOSIS — M9902 Segmental and somatic dysfunction of thoracic region: Secondary | ICD-10-CM | POA: Diagnosis not present

## 2022-01-04 ENCOUNTER — Other Ambulatory Visit: Payer: Self-pay | Admitting: Family Medicine

## 2022-01-04 ENCOUNTER — Other Ambulatory Visit: Payer: Self-pay

## 2022-01-04 DIAGNOSIS — R7989 Other specified abnormal findings of blood chemistry: Secondary | ICD-10-CM

## 2022-01-29 ENCOUNTER — Ambulatory Visit
Admission: RE | Admit: 2022-01-29 | Discharge: 2022-01-29 | Disposition: A | Discharge status: Home / Self Care | Payer: PPO | Source: Ambulatory Visit | Attending: Family Medicine | Admitting: Family Medicine

## 2022-01-29 ENCOUNTER — Encounter: Payer: Self-pay | Admitting: Family Medicine

## 2022-01-29 DIAGNOSIS — Z780 Asymptomatic menopausal state: Secondary | ICD-10-CM | POA: Diagnosis not present

## 2022-01-29 DIAGNOSIS — M8589 Other specified disorders of bone density and structure, multiple sites: Secondary | ICD-10-CM | POA: Diagnosis not present

## 2022-01-29 DIAGNOSIS — M5412 Radiculopathy, cervical region: Secondary | ICD-10-CM | POA: Diagnosis not present

## 2022-01-29 DIAGNOSIS — Z1231 Encounter for screening mammogram for malignant neoplasm of breast: Secondary | ICD-10-CM | POA: Diagnosis not present

## 2022-01-29 DIAGNOSIS — M9901 Segmental and somatic dysfunction of cervical region: Secondary | ICD-10-CM | POA: Diagnosis not present

## 2022-01-29 DIAGNOSIS — M5033 Other cervical disc degeneration, cervicothoracic region: Secondary | ICD-10-CM | POA: Diagnosis not present

## 2022-01-29 DIAGNOSIS — M9902 Segmental and somatic dysfunction of thoracic region: Secondary | ICD-10-CM | POA: Diagnosis not present

## 2022-01-31 ENCOUNTER — Ambulatory Visit: Payer: PPO | Admitting: Internal Medicine

## 2022-02-07 DIAGNOSIS — M5033 Other cervical disc degeneration, cervicothoracic region: Secondary | ICD-10-CM | POA: Diagnosis not present

## 2022-02-07 DIAGNOSIS — M9902 Segmental and somatic dysfunction of thoracic region: Secondary | ICD-10-CM | POA: Diagnosis not present

## 2022-02-07 DIAGNOSIS — M9901 Segmental and somatic dysfunction of cervical region: Secondary | ICD-10-CM | POA: Diagnosis not present

## 2022-02-07 DIAGNOSIS — M5412 Radiculopathy, cervical region: Secondary | ICD-10-CM | POA: Diagnosis not present

## 2022-02-12 ENCOUNTER — Other Ambulatory Visit (INDEPENDENT_AMBULATORY_CARE_PROVIDER_SITE_OTHER): Payer: PPO

## 2022-02-12 DIAGNOSIS — R7989 Other specified abnormal findings of blood chemistry: Secondary | ICD-10-CM | POA: Diagnosis not present

## 2022-02-12 LAB — HEPATIC FUNCTION PANEL
ALT: 34 U/L (ref 0–35)
AST: 36 U/L (ref 0–37)
Albumin: 4.5 g/dL (ref 3.5–5.2)
Alkaline Phosphatase: 43 U/L (ref 39–117)
Bilirubin, Direct: 0.1 mg/dL (ref 0.0–0.3)
Total Bilirubin: 0.6 mg/dL (ref 0.2–1.2)
Total Protein: 7 g/dL (ref 6.0–8.3)

## 2022-02-15 DIAGNOSIS — M9902 Segmental and somatic dysfunction of thoracic region: Secondary | ICD-10-CM | POA: Diagnosis not present

## 2022-02-15 DIAGNOSIS — M9901 Segmental and somatic dysfunction of cervical region: Secondary | ICD-10-CM | POA: Diagnosis not present

## 2022-02-15 DIAGNOSIS — M5412 Radiculopathy, cervical region: Secondary | ICD-10-CM | POA: Diagnosis not present

## 2022-02-15 DIAGNOSIS — M5033 Other cervical disc degeneration, cervicothoracic region: Secondary | ICD-10-CM | POA: Diagnosis not present

## 2022-02-20 DIAGNOSIS — M9901 Segmental and somatic dysfunction of cervical region: Secondary | ICD-10-CM | POA: Diagnosis not present

## 2022-02-20 DIAGNOSIS — M5412 Radiculopathy, cervical region: Secondary | ICD-10-CM | POA: Diagnosis not present

## 2022-02-20 DIAGNOSIS — M5033 Other cervical disc degeneration, cervicothoracic region: Secondary | ICD-10-CM | POA: Diagnosis not present

## 2022-02-20 DIAGNOSIS — M9902 Segmental and somatic dysfunction of thoracic region: Secondary | ICD-10-CM | POA: Diagnosis not present

## 2022-02-21 DIAGNOSIS — L814 Other melanin hyperpigmentation: Secondary | ICD-10-CM | POA: Diagnosis not present

## 2022-02-21 DIAGNOSIS — L578 Other skin changes due to chronic exposure to nonionizing radiation: Secondary | ICD-10-CM | POA: Diagnosis not present

## 2022-02-21 DIAGNOSIS — L249 Irritant contact dermatitis, unspecified cause: Secondary | ICD-10-CM | POA: Diagnosis not present

## 2022-02-21 DIAGNOSIS — D225 Melanocytic nevi of trunk: Secondary | ICD-10-CM | POA: Diagnosis not present

## 2022-02-21 DIAGNOSIS — L648 Other androgenic alopecia: Secondary | ICD-10-CM | POA: Diagnosis not present

## 2022-02-21 DIAGNOSIS — Z86018 Personal history of other benign neoplasm: Secondary | ICD-10-CM | POA: Diagnosis not present

## 2022-02-21 DIAGNOSIS — L400 Psoriasis vulgaris: Secondary | ICD-10-CM | POA: Diagnosis not present

## 2022-02-21 DIAGNOSIS — D485 Neoplasm of uncertain behavior of skin: Secondary | ICD-10-CM | POA: Diagnosis not present

## 2022-02-21 DIAGNOSIS — Z872 Personal history of diseases of the skin and subcutaneous tissue: Secondary | ICD-10-CM | POA: Diagnosis not present

## 2022-02-21 DIAGNOSIS — L308 Other specified dermatitis: Secondary | ICD-10-CM | POA: Diagnosis not present

## 2022-02-22 DIAGNOSIS — M5033 Other cervical disc degeneration, cervicothoracic region: Secondary | ICD-10-CM | POA: Diagnosis not present

## 2022-02-22 DIAGNOSIS — M9901 Segmental and somatic dysfunction of cervical region: Secondary | ICD-10-CM | POA: Diagnosis not present

## 2022-02-22 DIAGNOSIS — M9902 Segmental and somatic dysfunction of thoracic region: Secondary | ICD-10-CM | POA: Diagnosis not present

## 2022-02-22 DIAGNOSIS — M5412 Radiculopathy, cervical region: Secondary | ICD-10-CM | POA: Diagnosis not present

## 2022-02-27 DIAGNOSIS — M9901 Segmental and somatic dysfunction of cervical region: Secondary | ICD-10-CM | POA: Diagnosis not present

## 2022-02-27 DIAGNOSIS — M9902 Segmental and somatic dysfunction of thoracic region: Secondary | ICD-10-CM | POA: Diagnosis not present

## 2022-02-27 DIAGNOSIS — M5033 Other cervical disc degeneration, cervicothoracic region: Secondary | ICD-10-CM | POA: Diagnosis not present

## 2022-02-27 DIAGNOSIS — M5412 Radiculopathy, cervical region: Secondary | ICD-10-CM | POA: Diagnosis not present

## 2022-03-01 DIAGNOSIS — M9901 Segmental and somatic dysfunction of cervical region: Secondary | ICD-10-CM | POA: Diagnosis not present

## 2022-03-01 DIAGNOSIS — M5033 Other cervical disc degeneration, cervicothoracic region: Secondary | ICD-10-CM | POA: Diagnosis not present

## 2022-03-01 DIAGNOSIS — M5412 Radiculopathy, cervical region: Secondary | ICD-10-CM | POA: Diagnosis not present

## 2022-03-01 DIAGNOSIS — M9902 Segmental and somatic dysfunction of thoracic region: Secondary | ICD-10-CM | POA: Diagnosis not present

## 2022-03-02 ENCOUNTER — Encounter: Payer: Self-pay | Admitting: Family Medicine

## 2022-03-06 DIAGNOSIS — M9901 Segmental and somatic dysfunction of cervical region: Secondary | ICD-10-CM | POA: Diagnosis not present

## 2022-03-06 DIAGNOSIS — M5033 Other cervical disc degeneration, cervicothoracic region: Secondary | ICD-10-CM | POA: Diagnosis not present

## 2022-03-06 DIAGNOSIS — M5412 Radiculopathy, cervical region: Secondary | ICD-10-CM | POA: Diagnosis not present

## 2022-03-06 DIAGNOSIS — M9902 Segmental and somatic dysfunction of thoracic region: Secondary | ICD-10-CM | POA: Diagnosis not present

## 2022-03-08 DIAGNOSIS — M5412 Radiculopathy, cervical region: Secondary | ICD-10-CM | POA: Diagnosis not present

## 2022-03-08 DIAGNOSIS — M9901 Segmental and somatic dysfunction of cervical region: Secondary | ICD-10-CM | POA: Diagnosis not present

## 2022-03-08 DIAGNOSIS — M9902 Segmental and somatic dysfunction of thoracic region: Secondary | ICD-10-CM | POA: Diagnosis not present

## 2022-03-08 DIAGNOSIS — M5033 Other cervical disc degeneration, cervicothoracic region: Secondary | ICD-10-CM | POA: Diagnosis not present

## 2022-03-13 DIAGNOSIS — M9902 Segmental and somatic dysfunction of thoracic region: Secondary | ICD-10-CM | POA: Diagnosis not present

## 2022-03-13 DIAGNOSIS — M9901 Segmental and somatic dysfunction of cervical region: Secondary | ICD-10-CM | POA: Diagnosis not present

## 2022-03-13 DIAGNOSIS — M5412 Radiculopathy, cervical region: Secondary | ICD-10-CM | POA: Diagnosis not present

## 2022-03-13 DIAGNOSIS — M5033 Other cervical disc degeneration, cervicothoracic region: Secondary | ICD-10-CM | POA: Diagnosis not present

## 2022-03-15 DIAGNOSIS — M9901 Segmental and somatic dysfunction of cervical region: Secondary | ICD-10-CM | POA: Diagnosis not present

## 2022-03-15 DIAGNOSIS — M5412 Radiculopathy, cervical region: Secondary | ICD-10-CM | POA: Diagnosis not present

## 2022-03-15 DIAGNOSIS — M5033 Other cervical disc degeneration, cervicothoracic region: Secondary | ICD-10-CM | POA: Diagnosis not present

## 2022-03-15 DIAGNOSIS — M9902 Segmental and somatic dysfunction of thoracic region: Secondary | ICD-10-CM | POA: Diagnosis not present

## 2022-03-20 DIAGNOSIS — M9901 Segmental and somatic dysfunction of cervical region: Secondary | ICD-10-CM | POA: Diagnosis not present

## 2022-03-20 DIAGNOSIS — M5412 Radiculopathy, cervical region: Secondary | ICD-10-CM | POA: Diagnosis not present

## 2022-03-20 DIAGNOSIS — M9902 Segmental and somatic dysfunction of thoracic region: Secondary | ICD-10-CM | POA: Diagnosis not present

## 2022-03-20 DIAGNOSIS — M5033 Other cervical disc degeneration, cervicothoracic region: Secondary | ICD-10-CM | POA: Diagnosis not present

## 2022-03-21 ENCOUNTER — Ambulatory Visit: Payer: PPO | Admitting: Urology

## 2022-03-21 ENCOUNTER — Encounter: Payer: Self-pay | Admitting: Urology

## 2022-03-21 VITALS — BP 135/76 | Wt 145.0 lb

## 2022-03-21 DIAGNOSIS — Z87440 Personal history of urinary (tract) infections: Secondary | ICD-10-CM

## 2022-03-21 DIAGNOSIS — N3090 Cystitis, unspecified without hematuria: Secondary | ICD-10-CM | POA: Diagnosis not present

## 2022-03-21 DIAGNOSIS — N393 Stress incontinence (female) (male): Secondary | ICD-10-CM

## 2022-03-21 LAB — URINALYSIS, COMPLETE
Bilirubin, UA: NEGATIVE
Glucose, UA: NEGATIVE
Ketones, UA: NEGATIVE
Leukocytes,UA: NEGATIVE
Nitrite, UA: NEGATIVE
Protein,UA: NEGATIVE
RBC, UA: NEGATIVE
Specific Gravity, UA: 1.02 (ref 1.005–1.030)
Urobilinogen, Ur: 0.2 mg/dL (ref 0.2–1.0)
pH, UA: 6.5 (ref 5.0–7.5)

## 2022-03-21 LAB — BLADDER SCAN AMB NON-IMAGING

## 2022-03-21 LAB — MICROSCOPIC EXAMINATION
Bacteria, UA: NONE SEEN
RBC, Urine: NONE SEEN /hpf (ref 0–2)

## 2022-03-21 NOTE — Progress Notes (Signed)
03/21/2022 11:19 AM   Ancil Linsey 05-26-1951 [id number]  Referring provider: Leone Haven, MD [address] [place],  [place] [address]  Chief Complaint  Patient presents with   Follow-up    Bladder leakage     HPI: 71 year old female with personal history of recurrent urinary tract infections who presents today specifically to discuss urinary issues.  She reports that she has some very mild incontinence with sneezing over the past several years.  After this bout of recurrent urinary tract infections, this is gotten exponentially worse.  Is very bothersome including interfering with her sex life due to fear of urinary leakage.  She describes her leakage as a dribbling.  It is exacerbated by activity, leaning over or bending stooping, laughing coughing and sneezing.  She denies any urgency or frequency type symptoms.  She only gets up 1 time at night to urinate.  She never has large volume leakage.  She wears an incontinence pad, approximately 1/day at this point.  She has upgraded to a level 3 pad however.  She also reports some irritation of her vulva due to chronic moisture.  She self treated for a yeast infection just in case.  She is using her estrogen cream.  PVR today 0.  Urinalysis today is negative.   PMH: Past Medical History:  Diagnosis Date   GERD (gastroesophageal reflux disease)    Hyperlipidemia    Osteoporosis    now has osteopenia   Thyroid disease     Surgical History: Past Surgical History:  Procedure Laterality Date   HAND SURGERY     40 yrs ago    Home Medications:  Allergies as of 03/21/2022   No Known Allergies      Medication List        Accurate as of March 21, 2022 11:19 AM. If you have any questions, ask your nurse or doctor.          b complex vitamins capsule Take by mouth.   Calcium Carb-Cholecalciferol 600-10 MG-MCG Tabs   CENTRUM SILVER PO Take 1 tablet by mouth daily.   Cranberry Plus Vitamin  C 4200-20-3 MG-MG-UNIT Caps Generic drug: Cranberry-Vitamin C-Vitamin E   D3 High Potency 125 MCG (5000 UT) capsule Generic drug: Cholecalciferol   estradiol 0.1 MG/GM vaginal cream Commonly known as: ESTRACE 1 gram intravaginally at bedtime daily for 2-3 weeks, then decrease to 3 times per week   levothyroxine 88 MCG tablet Commonly known as: SYNTHROID TAKE ONE TABLET BY MOUTH DAILY   PROBIOTIC-10 ULTIMATE PO Take by mouth.   simvastatin 20 MG tablet Commonly known as: ZOCOR TAKE ONE TABLET BY MOUTH EVERY EVENING        Allergies: No Known Allergies  Family History: Family History  Problem Relation Age of Onset   Breast cancer Mother 64   Heart disease Father    Cancer Father        Prostate   Diabetes Father        diagnosed later in life   Prostate cancer Father    Colon polyps Father 25   Parkinson's disease Father    Congestive Heart Failure Father    Asthma Brother    Colon cancer Neg Hx    Rectal cancer Neg Hx    Stomach cancer Neg Hx    Esophageal cancer Neg Hx     Social History:  reports that she quit smoking about 33 years ago. Her smoking use included cigarettes. She has never used  smokeless tobacco. She reports current alcohol use of about 7.0 standard drinks of alcohol per week. She reports that she does not use drugs.   Physical Exam: BP 135/76   Wt 145 lb (65.8 kg)   BMI 24.13 kg/m   Constitutional:  Alert and oriented, No acute distress. HEENT: [place] AT, moist mucus membranes.  Trachea midline, no masses. Cardiovascular: No clubbing, cyanosis, or edema. Respiratory: Normal respiratory effort, no increased work of breathing. GI: Abdomen is soft, nontender, nondistended, no abdominal masses GU: No CVA tenderness Skin: No rashes, bruises or suspicious lesions. Neurologic: Grossly intact, no focal deficits, moving all 4 extremities. Psychiatric: Normal mood and affect.  Laboratory Data: Lab Results  Component Value Date   WBC 4.4  02/22/2015   HGB 13.2 02/22/2015   HCT 38.8 02/22/2015   MCV 98.1 02/22/2015   PLT 254.0 02/22/2015    Lab Results  Component Value Date   CREATININE 0.81 11/08/2021    Urinalysis UA today is negative   Assessment & Plan:    1. Stress incontinence, female Worsening stress incontinence without urge  We discussed the pathophysiology of stress incontinence is a primarily structural issue.  That being said, behavioral modification including pelvic floor exercises and avoidance of irritating substances also sometimes helpful.  We discussed various options including referral to PT to work on pelvic floor strengthening versus procedural surgical intervention discussed.  She would like to start conservatively with PT.  I  f this fails, she would like to meet with my partner, Dr. Bjorn Loser for consultation - Ambulatory referral to Physical Therapy  2. Recurrent cystitis Continue estrogen cream - Urinalysis, Complete - BLADDER SCAN AMB NON-IMAGING    Hollice Espy, MD  [facility] [address], [place], [place] [address] [phone]

## 2022-03-22 DIAGNOSIS — M9901 Segmental and somatic dysfunction of cervical region: Secondary | ICD-10-CM | POA: Diagnosis not present

## 2022-03-22 DIAGNOSIS — M5033 Other cervical disc degeneration, cervicothoracic region: Secondary | ICD-10-CM | POA: Diagnosis not present

## 2022-03-22 DIAGNOSIS — M9902 Segmental and somatic dysfunction of thoracic region: Secondary | ICD-10-CM | POA: Diagnosis not present

## 2022-03-22 DIAGNOSIS — M5412 Radiculopathy, cervical region: Secondary | ICD-10-CM | POA: Diagnosis not present

## 2022-03-27 DIAGNOSIS — M9902 Segmental and somatic dysfunction of thoracic region: Secondary | ICD-10-CM | POA: Diagnosis not present

## 2022-03-27 DIAGNOSIS — M9901 Segmental and somatic dysfunction of cervical region: Secondary | ICD-10-CM | POA: Diagnosis not present

## 2022-03-27 DIAGNOSIS — M5033 Other cervical disc degeneration, cervicothoracic region: Secondary | ICD-10-CM | POA: Diagnosis not present

## 2022-03-27 DIAGNOSIS — M5412 Radiculopathy, cervical region: Secondary | ICD-10-CM | POA: Diagnosis not present

## 2022-04-06 ENCOUNTER — Telehealth: Payer: PPO | Admitting: Emergency Medicine

## 2022-04-06 DIAGNOSIS — R300 Dysuria: Secondary | ICD-10-CM

## 2022-04-06 MED ORDER — PHENAZOPYRIDINE HCL 200 MG PO TABS: 200.0000 mg | ORAL_TABLET | Freq: Three times a day (TID) | ORAL | 0 refills | Status: DC

## 2022-04-06 MED ORDER — CIPROFLOXACIN HCL 500 MG PO TABS: 500.0000 mg | ORAL_TABLET | Freq: Two times a day (BID) | ORAL | 0 refills | Status: DC

## 2022-04-06 NOTE — Progress Notes (Signed)
E-Visit for Urinary Problems  We are sorry that you are not feeling well.  Here is how we plan to help!  Based on what you shared with me it looks like you most likely have a simple urinary tract infection.  A UTI (Urinary Tract Infection) is a bacterial infection of the bladder.  Most cases of urinary tract infections are simple to treat but a key part of your care is to encourage you to drink plenty of fluids and watch your symptoms carefully.  I have prescribed Cipro and AZO.  Your symptoms should gradually improve. Call us if the burning in your urine worsens, you develop worsening fever, back pain or pelvic pain or if your symptoms do not resolve after completing the antibiotic.  Urinary tract infections can be prevented by drinking plenty of water to keep your body hydrated.  Also be sure when you wipe, wipe from front to back and don't hold it in!  If possible, empty your bladder every 4 hours.  MDM Chart review shows resistant to most cephalosporins.  Patient prefers Cipro per urology note.  Last culture shows sensitivity to Cipro.  HOME CARE Drink plenty of fluids Compete the full course of the antibiotics even if the symptoms resolve Remember, when you need to go.go. Holding in your urine can increase the likelihood of getting a UTI! GET HELP RIGHT AWAY IF: You cannot urinate You get a high fever Worsening back pain occurs You see blood in your urine You feel sick to your stomach or throw up You feel like you are going to pass out  MAKE SURE YOU  Understand these instructions. Will watch your condition. Will get help right away if you are not doing well or get worse.   Thank you for choosing an e-visit.  Your e-visit answers were reviewed by a board certified advanced clinical practitioner to complete your personal care plan. Depending upon the condition, your plan could have included both over the counter or prescription medications.  Please review your pharmacy  choice. Make sure the pharmacy is open so you can pick up prescription now. If there is a problem, you may contact your provider through [employer] and have the prescription routed to another pharmacy.  Your safety is important to [place]. If you have drug allergies check your prescription carefully.   For the next 24 hours you can use MyChart to ask questions about today's visit, request a non-urgent call back, or ask for a work or school excuse. You will get an email in the next two days asking about your experience. I hope that your e-visit has been valuable and will speed your recovery.  Approximately 5 minutes was used in reviewing the patient's chart, questionnaire, prescribing medications, and documentation.

## 2022-04-10 DIAGNOSIS — M9901 Segmental and somatic dysfunction of cervical region: Secondary | ICD-10-CM | POA: Diagnosis not present

## 2022-04-10 DIAGNOSIS — M9902 Segmental and somatic dysfunction of thoracic region: Secondary | ICD-10-CM | POA: Diagnosis not present

## 2022-04-10 DIAGNOSIS — M5033 Other cervical disc degeneration, cervicothoracic region: Secondary | ICD-10-CM | POA: Diagnosis not present

## 2022-04-10 DIAGNOSIS — M5412 Radiculopathy, cervical region: Secondary | ICD-10-CM | POA: Diagnosis not present

## 2022-04-12 ENCOUNTER — Ambulatory Visit: Payer: PPO

## 2022-04-17 ENCOUNTER — Ambulatory Visit: Payer: PPO | Attending: Urology

## 2022-04-17 DIAGNOSIS — M6281 Muscle weakness (generalized): Secondary | ICD-10-CM | POA: Diagnosis not present

## 2022-04-17 DIAGNOSIS — N393 Stress incontinence (female) (male): Secondary | ICD-10-CM | POA: Diagnosis not present

## 2022-04-17 DIAGNOSIS — R278 Other lack of coordination: Secondary | ICD-10-CM | POA: Diagnosis not present

## 2022-04-17 DIAGNOSIS — M6289 Other specified disorders of muscle: Secondary | ICD-10-CM

## 2022-04-17 NOTE — Therapy (Signed)
OUTPATIENT PHYSICAL THERAPY FEMALE PELVIC EVALUATION   Patient Name: MONI ROTHROCK MRN: [id number] DOB:01-Aug-1950, 71 y.o., female Today's Date: 04/17/2022   PT End of Session - 04/17/22 0844     Visit Number 1    Number of Visits 10    Date for PT Re-Evaluation 06/26/22    Authorization Type IE: 04/17/22    PT Start Time 0845    PT Stop Time 0925    PT Time Calculation (min) 40 min    Activity Tolerance Patient tolerated treatment well             Past Medical History:  Diagnosis Date   GERD (gastroesophageal reflux disease)    Hyperlipidemia    Osteoporosis    now has osteopenia   Thyroid disease    Past Surgical History:  Procedure Laterality Date   HAND SURGERY     40 yrs ago   Patient Active Problem List   Diagnosis Date Noted   LFTs abnormal 11/10/2021   Toe deformity 11/10/2021   Cystitis 07/31/2021   Stress incontinence 11/08/2020   Stress 11/08/2020   Skin exam, screening for cancer 11/08/2020   Erythematous rash 10/15/2018   Ganglion cyst of finger 09/24/2018   Eczema of scalp 04/03/2017   Hair loss 06/02/2014   Encounter for general adult medical examination with abnormal findings 11/24/2013   Hyperlipidemia 05/20/2013   Hypothyroidism 05/20/2013   Osteoporosis 05/20/2013   Thyroid nodule 05/20/2013   Carpal tunnel syndrome 02/10/2013   Elevated TSH 02/10/2013    PCP: Leone Haven, MD  REFERRING PROVIDER: Hollice Espy, MD  REFERRING DIAG: N39.3 (ICD-10-CM) - Stress incontinence, female   THERAPY DIAG:  Pelvic floor dysfunction  Other lack of coordination  Muscle weakness (generalized)  Rationale for Evaluation and Treatment: Rehabilitation  ONSET DATE: 1 year   RED FLAGS: N/A  Have you had any night sweats? Unexplained weight loss? Saddle anesthesia?  Unexplained changes in bowel or bladder habits?   SUBJECTIVE: Patient confirms identification and approves PT to assess pelvic floor and treatment Yes                                                                                                                                                                                            PRECAUTIONS: None  WEIGHT BEARING RESTRICTIONS: No  FALLS:  Has patient fallen in last 6 months? 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 Pt also had a series of UTI's last year which is where she started taking cranberry and probiotic pills. Pt does hold her urine occasionally because she gets distracted or is busy at work. Some days are worse than others in terms of leakage.    PAIN:  Are you having pain? No NPRS scale: 0/10 Pain location:     PATIENT GOALS: Pt would like to not have urinary leakage and not have to wear incontinence pads    UROLOGICAL HISTORY Fluid intake: 2 cups coffee in morning, sparkling water, pure water, white wine at night (no more than 2)   Pain with urination: No Fully empty bladder: No Stream: Strong Urgency: No  Frequency: 6-7x  Just in case: Yes  Nocturia: 1-2x  Leakage: Coughing, Sneezing, Laughing, Exercise, Lifting, and Bending forward Pads: Yes Type: Lvl 3, Poise Amount: 2x/day, can be more if doing any of the above  Bladder control (0-10): 6/10   GASTROINTESTINAL HISTORY Pt has no concerns Pain with bowel movement: No Type of bowel movement: Frequency:  Fully empty rectum:  Leakage:  Pads:  Fiber supplement:    SEXUAL HISTORY/FUNCTION  Pain with intercourse: no Ability to have vaginal penetration:  Yes; Deep thrusting: Yes Able to achieve orgasm?: Yes  OBSTETRICAL HISTORY Vaginal deliveries: G2P1 Tearing: yes    GYNECOLOGICAL HISTORY Hysterectomy: no Pelvic Organ Prolapse: None Pain with  exam: no Heaviness/pressure: no   OBJECTIVE:    COGNITION: Overall cognitive status: Within functional limits for tasks assessed     POSTURE: Deferred 2/2 time constraints Grossly  Lumbar lordosis:   Thoracic kyphosis: Iliac crest height:  Lumbar lateral shift:  Pelvic obliquity:  Leg length discrepancy:   GAIT: Deferred 2/2 time constraints Distance walked:  Assistive device utilized:  Level of assistance:  Comments:   Trendelenburg:   SENSATION: Deferred 2/2 time constraints Light touch: , L2-S2 dermatomes  Proprioception:    RANGE OF MOTION:  Deferred 2/2 time constraints  (Norm range in degrees)  LEFT  RIGHT   Lumbar forward flexion (65):      Lumbar extension (30):     Lumbar lateral flexion (25):     Thoracic and Lumbar rotation (30 degrees):       Hip Flexion (0-125):      Hip IR (0-45):     Hip ER (0-45):     Hip Adduction:      Hip Abduction (0-40):     Hip extension (0-15):     (*= pain, Blank rows = not tested)   STRENGTH: MMT  Deferred 2/2 time constraints  RLE  LLE   Hip Flexion    Hip Extension    Hip Abduction     Hip Adduction     Hip ER     Hip IR     Knee Extension    Knee Flexion    Dorsiflexion     Plantarflexion (seated)    (*= pain, Blank rows = not tested)   SPECIAL TESTS: Deferred 2/2 time constraints Centralization and Peripheralization (SN 92, -LR 0.12):  Slump (SN 83, -LR 0.32):  SLR (SN 92, -LR 0.29): R: Lumbar quadrant (SN 70): R:  FABER (SN 81): FADIR (SN 94):  Hip scour (SN 50):  Thigh Thrust (SN 88, -LR 0.18) : Distraction (ON62):  Compression (SN/SP 69): Stork/March (SP 93):  PHYSICAL PERFORMANCE MEASURES:Deferred 2/2 time constraints  STS:  RLE SLS:  LLE SLS:  6 MWT:  10MWT:  5TSTS:  PALPATION: Deferred 2/2 time constraints Abdominal:  Diastasis:  finger above umbilicus,  fingers at and below umbilicus  Scar mobility: present/mobile perpendicular, parallel Rib flare:  present/absent  EXTERNAL PELVIC EXAM: Patient educated on the purpose of the pelvic exam and articulated understanding; patient consented to the exam verbally. Deferred 2/2 time constraints Palpation: Breath coordination: present/absent/inconsistent Voluntary Contraction: present/absent Relaxation: full/delayed/non-relaxing Perineal movement with sustained IAP increase ("bear down"): descent/no change/elevation/excessive descent Perineal movement with rapid IAP increase ("cough"): elevation/no change/descent Pubic symphysis: (0= no contraction, 1= flicker, 2= weak squeeze, 3= fair squeeze with lift, 4= good squeeze and lift against resistance, 5= strong squeeze against strong resistance)   INTERNAL PELVIC EXAM: Patient educated on the purpose of the pelvic exam and articulated understanding; patient consented to the exam verbally. Deferred 2/2 to time constraints Introitus Appears:  Skin integrity:  Scar mobility: Strength (PERF):  Symmetry: Palpation: Prolapse: (0= no contraction, 1= flicker, 2= weak squeeze, 3= fair squeeze with lift, 4= good squeeze and lift against resistance, 5= strong squeeze against strong resistance)    Patient Education:  Patient educated on what to expect during course of physical therapy, POC, and provided with HEP including: bladder irritants handodut. Patient verbalized understanding and returned demonstration. Patient will benefit from further education in order to maximize compliance and understanding for long-term therapeutic gains.   Patient Surveys:  FOTO Urinary Problem - 56     ASSESSMENT:  Clinical Impression: Patient is a 71 y.o. who was seen today for physical therapy evaluation and treatment for a chief concern of urinary leakage. Today's evaluation suggest deficits in IAP management, PFM coordination, PFM endurance, pain, and PFM extensibility as evidenced by urinary leakage with coughing/sneezing/lifting/bending/occasionally with physical  activity, use of incontinence pads (2x/day or more), increased urinary frequency ("just in case" trips to bathroom), hx of LBP, and hx of UTIs. Patient's responses on FOTO Urinary Problem (56) indicates moderate limitation/disability/distress. Patient's progress may be limited due to time since onsest; however, patient's motivation is advantageous. Pt with basic understanding of PFM function in bowel/bladder habits, sexual function, deep core, and posture. Patient will benefit from skilled therapeutic intervention to address deficits in IAP management, PFM coordination, PFM endurance, and PFM extensibility in order to increase PLOF and improve overall QOL.    Objective Impairments: decreased coordination, decreased endurance, decreased strength, improper body mechanics, and pain.   Activity Limitations: carrying, lifting, bending, squatting, continence, and toileting  Personal Factors: Age, Behavior pattern, Past/current experiences, Time since onset of injury/illness/exacerbation, and 1-2 comorbidities: osteoporosis, hypothyroidism  are also affecting patient's functional outcome.   Rehab Potential: Good  Clinical Decision Making: Evolving/moderate complexity  Evaluation Complexity: Moderate   GOALS: Goals reviewed with patient? Yes  SHORT TERM GOALS: Target date: 05/22/2022  Patient will demonstrate independence with HEP in order to maximize therapeutic gains and improve carryover from physical therapy sessions to ADLs in the home and community. Baseline: bladder irritants handout Goal status: INITIAL   LONG TERM GOALS: Target date: 06/26/2022   Patient will score >/= 63 on FOTO Urinary Problem  in order to demonstrate improved IAP management, improved PFM coordination/strength, and overall QOL.  Baseline: 56 Goal status: INITIAL  2.  Patient will report less than 5 incidents of stress urinary incontinence over the course of 3 weeks while coughing/sneezing/laughing/physical  activity/lifting/bending in order to demonstrate improved PFM coordination, strength, and function for improved overall QOL. Baseline: urinary leakage with all the above  Goal status: INITIAL  3.  Patient will report decreased reliance on  protective undergarments as indicated by a 24 hour period to demonstrate improved bladder control and allow for increased participation in activities outside of the home. Baseline: 2x/day or more, Lvl 3 Poise  Goal status: INITIAL  4.  Patient will demonstrate circumferential and sequential contraction of >3/5 MMT, > 5 sec hold x5 and 5 consecutive quick flicks with </= 10 min rest between testing bouts, and relaxation of the PFM coordinated with breath for improved management of intra-abdominal pressure and normal bowel and bladder function without the presence of pain nor incontinence in order to improve participation at home and in the community. Baseline: will assess next visit  Goal status: INITIAL  5.  Patient will demonstrate coordinated lengthening and relaxation of PFM with diaphragmatic inhalation in order to decrease spasm and allow for unrestricted elimination of urine/feces for improved overall QOL. Baseline: will assess next visit, hx of UTIs Goal status: INITIAL    PLAN: PT Frequency: 1x/week  PT Duration: 10 weeks  Planned Interventions: Therapeutic exercises, Therapeutic activity, Neuromuscular re-education, Balance training, Gait training, Patient/Family education, Self Care, Joint mobilization, Cryotherapy, Moist heat, scar mobilization, Taping, and Manual therapy  Plan For Next Session: phy assess  Claudie Brickhouse, PT, DPT  04/17/2022, 12:59 PM

## 2022-04-22 ENCOUNTER — Telehealth: Payer: PPO

## 2022-04-22 ENCOUNTER — Telehealth: Payer: PPO | Admitting: Family

## 2022-04-22 DIAGNOSIS — R399 Unspecified symptoms and signs involving the genitourinary system: Secondary | ICD-10-CM

## 2022-04-22 MED ORDER — CEPHALEXIN 500 MG PO CAPS: 500.0000 mg | ORAL_CAPSULE | Freq: Two times a day (BID) | ORAL | 0 refills | Status: DC

## 2022-04-22 NOTE — Patient Instructions (Signed)
Urinary Tract Infection, Adult  A urinary tract infection (UTI) is an infection of any part of the urinary tract. The urinary tract includes the kidneys, ureters, bladder, and urethra. These organs make, store, and get rid of urine in the body. An upper UTI affects the ureters and kidneys. A lower UTI affects the bladder and urethra. What are the causes? Most urinary tract infections are caused by bacteria in your genital area around your urethra, where urine leaves your body. These bacteria grow and cause inflammation of your urinary tract. What increases the risk? You are more likely to develop this condition if: You have a urinary catheter that stays in place. You are not able to control when you urinate or have a bowel movement (incontinence). You are female and you: Use a spermicide or diaphragm for birth control. Have low estrogen levels. Are pregnant. You have certain genes that increase your risk. You are sexually active. You take antibiotic medicines. You have a condition that causes your flow of urine to slow down, such as: An enlarged prostate, if you are female. Blockage in your urethra. A kidney stone. A nerve condition that affects your bladder control (neurogenic bladder). Not getting enough to drink, or not urinating often. You have certain medical conditions, such as: Diabetes. A weak disease-fighting system (immunesystem). Sickle cell disease. Gout. Spinal cord injury. What are the signs or symptoms? Symptoms of this condition include: Needing to urinate right away (urgency). Frequent urination. This may include small amounts of urine each time you urinate. Pain or burning with urination. Blood in the urine. Urine that smells bad or unusual. Trouble urinating. Cloudy urine. Vaginal discharge, if you are female. Pain in the abdomen or the lower back. You may also have: Vomiting or a decreased appetite. Confusion. Irritability or tiredness. A fever or  chills. Diarrhea. The first symptom in older adults may be confusion. In some cases, they may not have any symptoms until the infection has worsened. How is this diagnosed? This condition is diagnosed based on your medical history and a physical exam. You may also have other tests, including: Urine tests. Blood tests. Tests for STIs (sexually transmitted infections). If you have had more than one UTI, a cystoscopy or imaging studies may be done to determine the cause of the infections. How is this treated? Treatment for this condition includes: Antibiotic medicine. Over-the-counter medicines to treat discomfort. Drinking enough water to stay hydrated. If you have frequent infections or have other conditions such as a kidney stone, you may need to see a health care provider who specializes in the urinary tract (urologist). In rare cases, urinary tract infections can cause sepsis. Sepsis is a life-threatening condition that occurs when the body responds to an infection. Sepsis is treated in the hospital with IV antibiotics, fluids, and other medicines. Follow these instructions at home:  Medicines Take over-the-counter and prescription medicines only as told by your health care provider. If you were prescribed an antibiotic medicine, take it as told by your health care provider. Do not stop using the antibiotic even if you start to feel better. General instructions Make sure you: Empty your bladder often and completely. Do not hold urine for long periods of time. Empty your bladder after sex. Wipe from front to back after urinating or having a bowel movement if you are female. Use each tissue only one time when you wipe. Drink enough fluid to keep your urine pale yellow. Keep all follow-up visits. This is important. Contact a health   care provider if: Your symptoms do not get better after 1-2 days. Your symptoms go away and then return. Get help right away if: You have severe pain in  your back or your lower abdomen. You have a fever or chills. You have nausea or vomiting. Summary A urinary tract infection (UTI) is an infection of any part of the urinary tract, which includes the kidneys, ureters, bladder, and urethra. Most urinary tract infections are caused by bacteria in your genital area. Treatment for this condition often includes antibiotic medicines. If you were prescribed an antibiotic medicine, take it as told by your health care provider. Do not stop using the antibiotic even if you start to feel better. Keep all follow-up visits. This is important. This information is not intended to replace advice given to you by your health care provider. Make sure you discuss any questions you have with your health care provider. Document Revised: 02/19/2020 Document Reviewed: 02/19/2020 Elsevier Patient Education  2023 [employer]  

## 2022-04-22 NOTE — Progress Notes (Signed)
Virtual Visit Consent   Shannon [name], you are scheduled for a virtual visit with a [facility] provider today. Just as with appointments in the office, your consent must be obtained to participate. Your consent will be active for this visit and any virtual visit you may have with one of our providers in the next 365 days. If you have a MyChart account, a copy of this consent can be sent to you electronically.  As this is a virtual visit, video technology does not allow for your provider to perform a traditional examination. This may limit your provider's ability to fully assess your condition. If your provider identifies any concerns that need to be evaluated in person or the need to arrange testing (such as labs, EKG, etc.), we will make arrangements to do so. Although advances in technology are sophisticated, we cannot ensure that it will always work on either your end or our end. If the connection with a video visit is poor, the visit may have to be switched to a telephone visit. With either a video or telephone visit, we are not always able to ensure that we have a secure connection.  By engaging in this virtual visit, you consent to the provision of healthcare and authorize for your insurance to be billed (if applicable) for the services provided during this visit. Depending on your insurance coverage, you may receive a charge related to this service.  I need to obtain your verbal consent now. Are you willing to proceed with your visit today? MARCHELLA HIBBARD has provided verbal consent on 04/22/2022 for a virtual visit (video or telephone). Evelina Dun, FNP  Date: 04/22/2022 9:07 AM  Virtual Visit via Video Note   I, Evelina Dun, connected with  Shannon [name]  ([id number], June 09, 1951) on 04/22/22 at  9:00 AM EDT by a video-enabled telemedicine application and verified that I am speaking with the correct person using two identifiers.  Location: Patient: Virtual Visit Location Patient:  Home Provider: Virtual Visit Location Provider: Home Office   I discussed the limitations of evaluation and management by telemedicine and the availability of in person appointments. The patient expressed understanding and agreed to proceed.    History of Present Illness: Shannon [name] is a 71 y.o. who identifies as a female who was assigned female at birth, and is being seen today for recurrent UTI symptoms. She was given cipro on 04/06/22 and while taking it her symptoms improved, but returned a week later.   HPI: Dysuria  This is a recurrent problem. The current episode started today. The problem has been gradually worsening. The quality of the pain is described as aching. The pain is mild. Associated symptoms include frequency, hesitancy, nausea and urgency. Pertinent negatives include no hematuria. She has tried antibiotics and increased fluids for the symptoms. The treatment provided mild relief.    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   Cystitis 07/31/2021   Stress incontinence 11/08/2020   Stress 11/08/2020   Skin exam, screening for cancer 11/08/2020   Erythematous rash 10/15/2018   Ganglion cyst of finger 09/24/2018   Eczema of scalp 04/03/2017   Hair loss 06/02/2014   Encounter for general adult medical examination with abnormal findings 11/24/2013   Hyperlipidemia 05/20/2013   Hypothyroidism 05/20/2013   Osteoporosis 05/20/2013   Thyroid nodule 05/20/2013   Carpal tunnel syndrome 02/10/2013   Elevated TSH 02/10/2013  Allergies: No Known Allergies Medications:  Current Outpatient Medications:    cephALEXin (KEFLEX) 500 MG capsule, Take 1 capsule (500 mg total) by mouth 2 (two) times daily., Disp: 14 capsule, Rfl: 0   b complex vitamins capsule, Take by mouth., Disp: , Rfl:    Calcium Carb-Cholecalciferol 600-10 MG-MCG TABS, , Disp: , Rfl:    Cholecalciferol (D3 HIGH POTENCY) 125 MCG  (5000 UT) capsule, , Disp: , Rfl:    Cranberry-Vitamin C-Vitamin E (CRANBERRY PLUS VITAMIN C) 4200-20-3 MG-MG-UNIT CAPS, , Disp: , Rfl:    estradiol (ESTRACE) 0.1 MG/GM vaginal cream, 1 gram intravaginally at bedtime daily for 2-3 weeks, then decrease to 3 times per week, Disp: 42.5 g, Rfl: 12   levothyroxine (SYNTHROID) 88 MCG tablet, TAKE ONE TABLET BY MOUTH DAILY, Disp: 90 tablet, Rfl: 3   Multiple Vitamins-Minerals (CENTRUM SILVER PO), Take 1 tablet by mouth daily., Disp: , Rfl:    Probiotic Product (PROBIOTIC-10 ULTIMATE PO), Take by mouth., Disp: , Rfl:    simvastatin (ZOCOR) 20 MG tablet, TAKE ONE TABLET BY MOUTH EVERY EVENING, Disp: 90 tablet, Rfl: 1  Observations/Objective: Patient is well-developed, well-nourished in no acute distress.  Resting comfortably  at home.  Head is normocephalic, atraumatic.  No labored breathing.  Speech is clear and coherent with logical content.  Patient is alert and oriented at baseline.    Assessment and Plan: 1. UTI symptoms - cephALEXin (KEFLEX) 500 MG capsule; Take 1 capsule (500 mg total) by mouth 2 (two) times daily.  Dispense: 14 capsule; Refill: 0  Force fluids Given this is a weekend, will treat with Keflex. She will follow up with Urologists for urine culture.  AZO over the counter X2 days Pt is followed by Urologists. If symptoms continue will need urine culture.    Follow Up Instructions: I discussed the assessment and treatment plan with the patient. The patient was provided an opportunity to ask questions and all were answered. The patient agreed with the plan and demonstrated an understanding of the instructions.  A copy of instructions were sent to the patient via MyChart unless otherwise noted below.     The patient was advised to call back or seek an in-person evaluation if the symptoms worsen or if the condition fails to improve as anticipated.  Time:  I spent 6 minutes with the patient via telehealth technology discussing  the above problems/concerns.    Evelina Dun, FNP

## 2022-04-24 ENCOUNTER — Ambulatory Visit: Payer: PPO | Attending: Urology

## 2022-04-24 DIAGNOSIS — M6289 Other specified disorders of muscle: Secondary | ICD-10-CM | POA: Diagnosis not present

## 2022-04-24 DIAGNOSIS — M6281 Muscle weakness (generalized): Secondary | ICD-10-CM | POA: Diagnosis not present

## 2022-04-24 DIAGNOSIS — R278 Other lack of coordination: Secondary | ICD-10-CM | POA: Insufficient documentation

## 2022-04-24 NOTE — Therapy (Signed)
OUTPATIENT PHYSICAL THERAPY FEMALE PELVIC TREATMENT   Patient Name: Shannon [name] MRN: [id number] DOB:1951/05/07, 71 y.o., female Today's Date: 04/24/2022   PT End of Session - 04/24/22 0847     Visit Number 2    Number of Visits 10    Date for PT Re-Evaluation 06/26/22    Authorization Type IE: 04/17/22    PT Start Time 0846    PT Stop Time 0926    PT Time Calculation (min) 40 min    Activity Tolerance Patient tolerated treatment well             Past Medical History:  Diagnosis Date   GERD (gastroesophageal reflux disease)    Hyperlipidemia    Osteoporosis    now has osteopenia   Thyroid disease    Past Surgical History:  Procedure Laterality Date   HAND SURGERY     40 yrs ago   Patient Active Problem List   Diagnosis Date Noted   LFTs abnormal 11/10/2021   Toe deformity 11/10/2021   Cystitis 07/31/2021   Stress incontinence 11/08/2020   Stress 11/08/2020   Skin exam, screening for cancer 11/08/2020   Erythematous rash 10/15/2018   Ganglion cyst of finger 09/24/2018   Eczema of scalp 04/03/2017   Hair loss 06/02/2014   Encounter for general adult medical examination with abnormal findings 11/24/2013   Hyperlipidemia 05/20/2013   Hypothyroidism 05/20/2013   Osteoporosis 05/20/2013   Thyroid nodule 05/20/2013   Carpal tunnel syndrome 02/10/2013   Elevated TSH 02/10/2013    PCP: Leone Haven, MD  REFERRING PROVIDER: Hollice Espy, MD  REFERRING DIAG: N39.3 (ICD-10-CM) - Stress incontinence, female   THERAPY DIAG:  Pelvic floor dysfunction  Muscle weakness (generalized)  Other lack of coordination  Rationale for Evaluation and Treatment: Rehabilitation  ONSET DATE: 1 year                                                                                                                                                                                           PRECAUTIONS: None  WEIGHT BEARING RESTRICTIONS: No  FALLS:  Has patient fallen  in last 6 months? 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 Pt also had a series of UTI's last year which is where she started taking cranberry and probiotic pills. Pt does hold her urine occasionally because she gets distracted or is busy at work. Some days are worse than others in terms of leakage.  PATIENT GOALS: Pt would like to not have urinary leakage and not have to wear incontinence pads    UROLOGICAL HISTORY Fluid intake: 2 cups coffee in morning, sparkling water, pure water, white wine at night (no more than 2)   Pain with urination: No Fully empty bladder: No Stream: Strong Urgency: No  Frequency: 6-7x  Just in case: Yes  Nocturia: 1-2x  Leakage: Coughing, Sneezing, Laughing, Exercise, Lifting, and Bending forward Pads: Yes Type: Lvl 3, Poise Amount: 2x/day, can be more if doing any of the above  Bladder control (0-10): 6/10    OBSTETRICAL HISTORY Vaginal deliveries: G2P1 Tearing: yes    GYNECOLOGICAL HISTORY Hysterectomy: no Pelvic Organ Prolapse: None Pain with exam: no Heaviness/pressure: no   SUBJECTIVE:  Patient with no changes from IE.   PAIN:  Are you having pain? No NPRS scale: 0/10    TODAY'S TREATMENT  Neuromuscular Re-education   Pre-treatment assessment:    OBJECTIVE:    COGNITION: Overall cognitive status: Within functional limits for tasks assessed     POSTURE:   Iliac crest height: L iliac crest higher, L shoulder elevation Pelvic obliquity: L posteriorly rotated innominate     RANGE OF MOTION:    (Norm range in degrees)  LEFT 04/24/22 RIGHT 04/24/22  Lumbar forward flexion (65):  WNL    Lumbar extension (30): WNL    Lumbar lateral flexion (25):  WNL WNL  Thoracic and Lumbar rotation (30 degrees):    WNL WNL  Hip Flexion (0-125):    WNL WNL  Hip IR (0-45):  WNL WNL  Hip ER (0-45):  WNL WNL  Hip Adduction:      Hip Abduction (0-40):  WNL WNL  Hip extension (0-15):     (*= pain, Blank rows = not tested)   STRENGTH: MMT    RLE 04/24/22 LLE 04/24/22  Hip Flexion 5 4  Hip Extension    Hip Abduction     Hip Adduction     Hip ER  5 5  Hip IR  5 5  Knee Extension 5 5  Knee Flexion 5 4  Dorsiflexion     Plantarflexion (seated) 5 5  (*= pain, Blank rows = not tested)   SPECIAL TESTS:   FABER (SN 81): negative B FADIR (SN 94): negative B     PALPATION:  Abdominal:  Diastasis:  2 fingers above umbilicus,  1.5 fingers at umbilicus, and 1 finger below umbilicus, significant doming observed  -no tenderness upon palpation but some muscular tension noted in lower quadrants   EXTERNAL PELVIC EXAM: Patient educated on the purpose of the pelvic exam and articulated understanding; patient consented to the exam verbally.  Breath coordination: present but inconsistent Voluntary Contraction: 4/5 MMT Relaxation: full Perineal movement with sustained IAP increase ("bear down"): elevation then cued again and felt no change, Valsalva noted Perineal movement with rapid IAP increase ("cough"): no change (0= no contraction, 1= flicker, 2= weak squeeze, 3= fair squeeze with lift, 4= good squeeze and lift against resistance, 5= strong squeeze against strong resistance)   Neuromuscular Re-education: Supine hooklying diaphragmatic breathing with VCs and TCs for downregulation of the nervous system and improved management of IAP  Seated diaphragmatic breathing with VCs and TCs for downregulation of the nervous system and improved management of IAP  Discussion and demonstration on log roll technique for improved IAP management and to decrease low back pain   Patient response to interventions: Pt excited to continue working with PFPT    Patient  Education:  Patient provided with HEP including: supine and seated diaphragmatic  breathing. Patient verbalized understanding and returned demonstration. Patient will benefit from further education in order to maximize compliance and understanding for long-term therapeutic gains.     ASSESSMENT:  Clinical Impression: Patient presents to clinic with excellent motivation to participate in today's session. Upon physical examination, Pt demonstrates deficits in IAP management, PFM coordination, PFM endurance, pain, LE strength, and posture as evidenced by increased L iliac crest height with L shoulder elevation, L posteriorly rotated innominate, continued LBP, 4/5 MMT of L hip flexion/knee flexion with observed fasciculations, presence of a DRA (2 fingers above umbilicus with significant doming observed), present but inconsistent breath coordination, and elevation/no change of PFM with sustained IAP increase ("bear down"). Pt required moderate VCs and TCs with diaphragmatic breathing to allow natural expansion of the abdomen and avoid chest excursion. Patient responded positively to active and educational interventions. Patient will benefit from skilled therapeutic intervention to address deficits in IAP management, PFM coordination, PFM endurance, pain, LE strength, and posture in order to increase PLOF and improve overall QOL.    Objective Impairments: decreased coordination, decreased endurance, decreased strength, improper body mechanics, and pain.   Activity Limitations: carrying, lifting, bending, squatting, continence, and toileting  Personal Factors: Age, Behavior pattern, Past/current experiences, Time since onset of injury/illness/exacerbation, and 1-2 comorbidities: osteoporosis, hypothyroidism  are also affecting patient's functional outcome.   Rehab Potential: Good  Clinical Decision Making: Evolving/moderate complexity  Evaluation Complexity: Moderate   GOALS: Goals reviewed with patient? Yes  SHORT TERM GOALS: Target date: 05/22/2022  Patient will  demonstrate independence with HEP in order to maximize therapeutic gains and improve carryover from physical therapy sessions to ADLs in the home and community. Baseline: bladder irritants handout Goal status: INITIAL   LONG TERM GOALS: Target date: 06/26/2022   Patient will score >/= 63 on FOTO Urinary Problem  in order to demonstrate improved IAP management, improved PFM coordination/strength, and overall QOL.  Baseline: 56 Goal status: INITIAL  2.  Patient will report less than 5 incidents of stress urinary incontinence over the course of 3 weeks while coughing/sneezing/laughing/physical activity/lifting/bending in order to demonstrate improved PFM coordination, strength, and function for improved overall QOL. Baseline: urinary leakage with all the above  Goal status: INITIAL  3.  Patient will report decreased reliance on protective undergarments as indicated by a 24 hour period to demonstrate improved bladder control and allow for increased participation in activities outside of the home. Baseline: 2x/day or more, Lvl 3 Poise  Goal status: INITIAL  4.  Patient will demonstrate circumferential and sequential contraction of >3/5 MMT, > 5 sec hold x5 and 5 consecutive quick flicks with </= 10 min rest between testing bouts, and relaxation of the PFM coordinated with breath for improved management of intra-abdominal pressure and normal bowel and bladder function without the presence of pain nor incontinence in order to improve participation at home and in the community. Baseline: 4/5 MMT, no quick flicks tested  Goal status: INITIAL  5.  Patient will demonstrate coordinated lengthening and relaxation of PFM with diaphragmatic inhalation in order to decrease spasm and allow for unrestricted elimination of urine/feces for improved overall QOL. Baseline: present but inconsistent  Goal status: INITIAL    PLAN: PT Frequency: 1x/week  PT Duration: 10 weeks  Planned Interventions:  Therapeutic exercises, Therapeutic activity, Neuromuscular re-education, Balance training, Gait training, Patient/Family education, Self Care, Joint mobilization, Cryotherapy, Moist heat, scar mobilization, Taping, and Manual therapy  Plan  For Next Session: thoracic rotation, PFM lengthening techniques/pain modulation?   Joellen Tullos, PT, DPT  04/24/2022, 9:44 AM

## 2022-04-25 DIAGNOSIS — M9901 Segmental and somatic dysfunction of cervical region: Secondary | ICD-10-CM | POA: Diagnosis not present

## 2022-04-25 DIAGNOSIS — M5412 Radiculopathy, cervical region: Secondary | ICD-10-CM | POA: Diagnosis not present

## 2022-04-25 DIAGNOSIS — M9902 Segmental and somatic dysfunction of thoracic region: Secondary | ICD-10-CM | POA: Diagnosis not present

## 2022-04-25 DIAGNOSIS — M5033 Other cervical disc degeneration, cervicothoracic region: Secondary | ICD-10-CM | POA: Diagnosis not present

## 2022-05-07 ENCOUNTER — Encounter: Payer: Self-pay | Admitting: Internal Medicine

## 2022-05-07 ENCOUNTER — Ambulatory Visit: Payer: PPO | Admitting: Internal Medicine

## 2022-05-07 VITALS — BP 110/68 | HR 66 | Ht 65.0 in | Wt 146.4 lb

## 2022-05-07 DIAGNOSIS — E041 Nontoxic single thyroid nodule: Secondary | ICD-10-CM

## 2022-05-07 DIAGNOSIS — E039 Hypothyroidism, unspecified: Secondary | ICD-10-CM | POA: Diagnosis not present

## 2022-05-07 LAB — T4, FREE: Free T4: 0.89 ng/dL (ref 0.60–1.60)

## 2022-05-07 LAB — TSH: TSH: 2.61 u[IU]/mL (ref 0.35–5.50)

## 2022-05-07 NOTE — Patient Instructions (Signed)
Please continue: - Levothyroxine 88 mcg daily.  Take the thyroid hormone every day, with water, at least 30 minutes before breakfast, separated by at least 4 hours from: - acid reflux medications - calcium - iron - multivitamins  Please stop at the lab.  Please return in 1 year. 

## 2022-05-07 NOTE — Progress Notes (Signed)
Patient ID: LANEA VANKIRK, female   DOB: 11-15-1950, 71 y.o.   MRN: [id number]  HPI f/u for  GLADIS SOLEY is a 71 y.o.-year-old female, returning for f/u for MNG and hypothyroidism. Last visit 1.5 years ago.  Interim hx: Pt denies feeling nodules in neck, hoarseness, dysphagia, choking. She has less hair loss -currently on Viviscal, Rogaine - topical.  Multinodular goiter Reviewed and addended history: Pt's PCP felt a right sided nodule at OV in 2014  - Thyroid U/S (05/22/2013): heterogeneous gland, multinodular goiter - largest nodule in left lobe: 2.5 x 1.4 x 2.4 cm, solid, slightly echogenic, moderate internal vascularity, no calcifications.  - largest nodule on the right: 1.4 x 0.9 x 1.0 cm, predominantly  isoechoic, with mild internal vascularity, no calcifications.   - FNA both nodules (06/12/2013): Adequacy Reason Satisfactory For Evaluation. Diagnosis THYROID, FINE NEEDLE ASPIRATION LEFT, (SPECIMEN 1 OF 2, COLLECTED ON 06/11/2013). 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  Specimen Clinical Information Nontoxic uninodular goiter, nodule, 2.5 x 1.4 x 2.4cm dominant left nodule   Adequacy Reason Satisfactory For Evaluation. Diagnosis THYROID, FINE NEEDLE ASPIRATION RIGHT, (SPECIMEN 2 OF 2, COLLECTED ON 11/20 2014). BENIGN. FINDINGS CONSISTENT WITH GOITER. FINDINGS CONSISTENT WITH THE CONTENTS OF A CYST.  Specimen Clinical Information Nontoxic uninodular goiter, Nodule, 1.4 x 0.9 x 1.0cm domnant right lobe thyroid nodule  The first nodule was characterized as consistent with a follicular neoplasm and/or lesion. This was not an entity described in the Bethesda criteria. I was not sure how to interpret this. The treatment plan for a follicular lesion would be to repeat FNA, while for follicular neoplasm would be surgical lobectomy.  I discussed with the pathologist >> he suggested that we interpret this as FLUS.  Thyroid U/S (02/21/2015): Right  thyroid lobe Measurements: 5.0 cm x 1.5 cm x 1.8 cm. Heterogeneous thyroid with relatively increased flow.  - Superior nodule measures 7 mm x 6 mm x 8 mm. - Previously biopsied lower right thyroid nodule measures 1.2 cm x 1.0 cm x 1.3 cm. Nodule is solid with internal reflectors. Left thyroid lobe Measurements: 6.3 cm x 2.0 cm x 1.9 cm.  Heterogeneous left thyroid with relatively increased flow. - Nodule at the inferior aspect has been previously biopsied and measures 2.9 cm x 1.8 cm x 2.8 cm. Isthmus Thickness: 3 mm.  No nodules visualized. Lymphadenopathy: None visualized.  Left thyroid nodule has increased in size. Due to previous history of follicular lesion of undetermined significance, I suggested repeat biopsy with [facility] molecular testing.     03/03/2015: FNA: Adequacy Reason Satisfactory For Evaluation. 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 COMMENT: THE SPECIMEN IS HYPERCELLULAR AND CONSISTS OF SMALL TO LARGE GROUPS OF FOLLICULAR EPITHELIAL CELLS WITH FOCAL HURTHLE CELL CHANGES. THERE IS MILD CYTOLOGIC ATYPIA, INCLUDING INTRANUCLEAR GROOVES. BASED ON THESE FEATURES, A FOLLICULAR LESION/NEOPLASM CAN NOT BE ENTIRELY RULED OUT. Specimen Clinical Information Nodule at the inferior aspect has been previously biopsied and measures 2.9 cm x 1.8 cm x 2.8 cm Source Thyroid, Fine Needle Aspiration, Left Lobe, (Specimen 1 of 1, collected on 03/02/15 )  AFIRMA test results were benign.  Thyroid U/S (10/15/2019): CLINICAL DATA:  Prior ultrasound follow-up. History of multinodular thyroid goiter and status post prior fine-needle aspiration of both right and left inferior nodules. The left inferior nodule has been sample twice including most recently on 03/02/2015.  Cytology has demonstrated atypia undetermined significance  (Bethesda 3).   THYROID ULTRASOUND Parenchymal Echotexture: Moderately heterogenous Isthmus: 0.4 cm Right lobe: 4.7 x 1.4 x 1.4 cm Left lobe: 5.8 x 1.4 x 1.7 cm _________________________________________________________   Nodule # 1: Prior biopsy: No Location: Right; Superior Maximum size: 1.0 cm; Other 2 dimensions: 0.9 x 0.6 cm, previously, 0.8 cm Composition: solid/almost completely solid (2) Echogenicity: isoechoic (1) Change in features: Yes Given size (<1.4 cm) and appearance, this nodule does NOT meet TI-RADS criteria for biopsy or dedicated follow-up. This nodule is barely visualized currently and may or may not still be present. On the prior study, a clearly appeared spongiform and benign in appearance.  ________________________________________________________   Nodule # 2: Prior biopsy: Yes Location: Right; Inferior Maximum size: 1.3 cm; Other 2 dimensions: 1.1 x 0.8 cm, previously, 1.3 cm Composition: solid/almost completely solid (2) Echogenicity: isoechoic (1) Given size (<1.4 cm) and appearance, this nodule does NOT meet TI-RADS criteria for biopsy or dedicated follow-up. This nodule appears stable. _________________________________________________________   Nodule # 3: Prior biopsy: Yes Location: Left; Inferior Maximum size: 2.4 cm; Other 2 dimensions: 2.2 x 1.5 cm, previously, 2.9 cm Composition: solid/almost completely solid (2) Echogenicity: isoechoic (1)  This nodule shows similar morphology and does appear to be smaller in overall volume compared to the 2016 study. It clearly has not grown.  ________________________________________________________   No abnormal lymph nodes identified.   IMPRESSION: 1. Residual nodule versus pseudo nodule in the superior right lobe measures 1 cm and does not meet criteria for biopsy or dedicated follow-up. 2. Stable 1.3 cm right inferior solid nodule which has been previously sampled. 3. The left inferior thyroid  nodule which has been previously sampled does appear to be smaller in volume and with similar morphology compared to the prior study in 2016. This nodule currently measures 2.4 x 1.5 x 2.2 cm compared to 2.9 x 1.8 x 2.8 cm previously.  Hypothyroidism  - dx ~2003 >> started on levoxyl then >> but stopped subsequently as she did not feel different. She restarted LT4, on 25 mcg daily and we then increased the dose gradually.  She was initially not taking this correctly, but she now takes it as advised.  Pt is now on levothyroxine 88 mcg daily, taken: - in am - fasting - at least 30-40 min from b'fast - no Fe, MVI, PPIs - + Calcium 600 mg daily - + multivitamins - stopped Biotin (120 MCG)  Reviewed her TFTs: Lab Results  Component Value Date   TSH 3.07 11/08/2021   TSH 2.25 11/03/2020   TSH 3.51 10/02/2019   TSH 2.48 09/22/2018   TSH 3.89 01/03/2018   TSH 6.51 (H) 10/09/2017   TSH 3.22 09/06/2017   TSH 2.71 10/08/2016   TSH 2.98 03/20/2016   TSH 6.15 (H) 01/23/2016   FREET4 1.02 11/08/2021   FREET4 0.92 11/03/2020   FREET4 1.15 10/02/2019   FREET4 0.94 09/22/2018   FREET4 1.01 01/03/2018   FREET4 0.84 10/09/2017   FREET4 1.33 09/06/2017   FREET4 0.90 10/08/2016   FREET4 0.89 03/20/2016   FREET4 0.97 01/23/2016    She has a history of osteoporosis-on alendronate-when she remembers, HL - on Zocor.  She is on Viviscal (biotin 120 mcg daily, low-dose calcium) -for hair loss. On Nioxin shampoo. On Rogain.  Lab Results  Component Value Date   VD25OH 69.06 10/14/2019   VD25OH 69.78 09/22/2018   VD25OH 35.11 09/06/2017   VD25OH 39.36 06/20/2016   VD25OH  39 11/24/2013   VD25OH 51 05/20/2013   She takes vitamin D 400 units daily.  ROS: + see HPI  I reviewed pt's medications, allergies, PMH, social hx, family hx, and changes were documented in the history of present illness. Otherwise, unchanged from my initial visit note.  Past Medical History:  Diagnosis Date   GERD  (gastroesophageal reflux disease)    Hyperlipidemia    Osteoporosis    now has osteopenia   Thyroid disease    Past Surgical History:  Procedure Laterality Date   HAND SURGERY     40 yrs ago   History   Social History   Marital Status: Married    Spouse Name: N/A    Number of Children: 1   Occupational History   retail.   Social History Main Topics   Smoking status: Former Smoker    Types: Cigarettes    Quit date: 01/18/1989   Smokeless tobacco: Never Used   Alcohol Use: 4.2 oz/week    7 Glasses of wine per week   Drug Use: No   Sexual Activity: Yes    Partners: Male   Social History Narrative   Lives in [place] with husband. 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 Dog in home. Born in [place], raised in [place]. [place]. Previously lived in [place].      Diet - regular diet      Exercise - walking   Caffeine: coffee in the morning; cup of hot tea      Hobbies - yardwork      Work - [employer] in [place]   Current Outpatient Medications on File Prior to Visit  Medication Sig Dispense Refill   b complex vitamins capsule Take by mouth.     Calcium Carb-Cholecalciferol 600-10 MG-MCG TABS      cephALEXin (KEFLEX) 500 MG capsule Take 1 capsule (500 mg total) by mouth 2 (two) times daily. 14 capsule 0   Cholecalciferol (D3 HIGH POTENCY) 125 MCG (5000 UT) capsule      Cranberry-Vitamin C-Vitamin E (CRANBERRY PLUS VITAMIN C) 4200-20-3 MG-MG-UNIT CAPS      estradiol (ESTRACE) 0.1 MG/GM vaginal cream 1 gram intravaginally at bedtime daily for 2-3 weeks, then decrease to 3 times per week 42.5 g 12   levothyroxine (SYNTHROID) 88 MCG tablet TAKE ONE TABLET BY MOUTH DAILY 90 tablet 3   Multiple Vitamins-Minerals (CENTRUM SILVER PO) Take 1 tablet by mouth daily.     Probiotic Product (PROBIOTIC-10 ULTIMATE PO) Take by mouth.     simvastatin (ZOCOR) 20 MG tablet TAKE ONE TABLET BY MOUTH EVERY EVENING 90 tablet 1   No current facility-administered medications on file prior to  visit.   No Known Allergies Family History  Problem Relation Age of Onset   Breast cancer Mother 40   Heart disease Father    Cancer Father        Prostate   Diabetes Father        diagnosed later in life   Prostate cancer Father    Colon polyps Father 46   Parkinson's disease Father    Congestive Heart Failure Father    Asthma Brother    Colon cancer Neg Hx    Rectal cancer Neg Hx    Stomach cancer Neg Hx    Esophageal cancer Neg Hx    PE: BP 110/68 (BP Location: Right Arm, Patient Position: Sitting, Cuff Size: Normal)   Pulse 66   Ht '5\' 5"'$  (1.651 m)   Wt 146  lb 6.4 oz (66.4 kg)   SpO2 98%   BMI 24.36 kg/m  Wt Readings from Last 3 Encounters:  05/07/22 146 lb 6.4 oz (66.4 kg)  03/21/22 145 lb (65.8 kg)  11/28/21 140 lb (63.5 kg)   Constitutional: normal weight, in NAD Eyes:  EOMI, no exophthalmos ENT: + mild thyromegaly, no cervical lymphadenopathy Cardiovascular: RRR, No MRG Respiratory: CTA B Musculoskeletal: no deformities Skin:no rashes Neurological: no tremor with outstretched hands  ASSESSMENT: 1. MNG  2. Hypothyroidism  PLAN: 1.  Multinodular goiter -no neck compression symptoms -Latest thyroid ultrasound checked in 2021, the previous showed that the nodules were stable or smaller than before; the dominant nodule measured 2.4 x 2.2 x 1.5 cm and was isoechoic with mild to moderate internal blood flow, more wide than tall.  This nodule was biopsied and the biopsy returned inconclusive (FLUS x2), however, Affirma molecular marker returned benign, so the risk of cancer is very low.  This nodule is smaller on the latest ultrasound (previously measuring 2.9 cm).  No biopsy was needed at that time. -We will repeat another ultrasound at next visit - I will see her back in 1 year  2. Hypothyroidism - latest thyroid labs reviewed with pt. >> normal: Lab Results  Component Value Date   TSH 3.07 11/08/2021  - she continues on LT4 88 mcg daily - pt feels good  on this dose. - we discussed about taking the thyroid hormone every day, with water, >30 minutes before breakfast, separated by >4 hours from acid reflux medications, calcium, iron, multivitamins. Pt. is taking it correctly. - will check thyroid tests today: TSH and fT4 - If labs are abnormal, she will need to return for repeat TFTs in 1.5 months  Component     Latest Ref Rng 05/07/2022  T4,Free(Direct)     0.60 - 1.60 ng/dL 0.89   TSH     0.35 - 5.50 uIU/mL 2.61    Normal TFTs.  Philemon Kingdom, MD PhD [facility] Endocr[facility]

## 2022-05-09 DIAGNOSIS — M5412 Radiculopathy, cervical region: Secondary | ICD-10-CM | POA: Diagnosis not present

## 2022-05-09 DIAGNOSIS — M5033 Other cervical disc degeneration, cervicothoracic region: Secondary | ICD-10-CM | POA: Diagnosis not present

## 2022-05-09 DIAGNOSIS — M9901 Segmental and somatic dysfunction of cervical region: Secondary | ICD-10-CM | POA: Diagnosis not present

## 2022-05-09 DIAGNOSIS — M9902 Segmental and somatic dysfunction of thoracic region: Secondary | ICD-10-CM | POA: Diagnosis not present

## 2022-05-10 ENCOUNTER — Ambulatory Visit: Payer: PPO

## 2022-05-10 DIAGNOSIS — M6289 Other specified disorders of muscle: Secondary | ICD-10-CM | POA: Diagnosis not present

## 2022-05-10 DIAGNOSIS — R278 Other lack of coordination: Secondary | ICD-10-CM

## 2022-05-10 DIAGNOSIS — M6281 Muscle weakness (generalized): Secondary | ICD-10-CM

## 2022-05-10 NOTE — Therapy (Signed)
OUTPATIENT PHYSICAL THERAPY FEMALE PELVIC TREATMENT   Patient Name: Shannon [name] MRN: [id number] DOB:Jan 29, 1951, 71 y.o., female Today's Date: 05/10/2022   PT End of Session - 05/10/22 0846     Visit Number 3    Number of Visits 10    Date for PT Re-Evaluation 06/26/22    Authorization Type IE: 04/17/22    PT Start Time 0847    PT Stop Time 0925    PT Time Calculation (min) 38 min    Activity Tolerance Patient tolerated treatment well             Past Medical History:  Diagnosis Date   GERD (gastroesophageal reflux disease)    Hyperlipidemia    Osteoporosis    now has osteopenia   Thyroid disease    Past Surgical History:  Procedure Laterality Date   HAND SURGERY     40 yrs ago   Patient Active Problem List   Diagnosis Date Noted   LFTs abnormal 11/10/2021   Toe deformity 11/10/2021   Cystitis 07/31/2021   Stress incontinence 11/08/2020   Stress 11/08/2020   Skin exam, screening for cancer 11/08/2020   Erythematous rash 10/15/2018   Ganglion cyst of finger 09/24/2018   Eczema of scalp 04/03/2017   Hair loss 06/02/2014   Encounter for general adult medical examination with abnormal findings 11/24/2013   Hyperlipidemia 05/20/2013   Hypothyroidism 05/20/2013   Osteoporosis 05/20/2013   Thyroid nodule 05/20/2013   Carpal tunnel syndrome 02/10/2013   Elevated TSH 02/10/2013    PCP: Leone Haven, MD  REFERRING PROVIDER: Hollice Espy, MD  REFERRING DIAG: N39.3 (ICD-10-CM) - Stress incontinence, female   THERAPY DIAG:  Pelvic floor dysfunction  Muscle weakness (generalized)  Other lack of coordination  Rationale for Evaluation and Treatment: Rehabilitation  ONSET DATE: 1 year                                                                                                                                                                                           PRECAUTIONS: None  WEIGHT BEARING RESTRICTIONS: No  FALLS:  Has patient fallen  in last 6 months? 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 Pt also had a series of UTI's last year which is where she started taking cranberry and probiotic pills. Pt does hold her urine occasionally because she gets distracted or is busy at work. Some days are worse than others in terms of leakage.  PATIENT GOALS: Pt would like to not have urinary leakage and not have to wear incontinence pads    UROLOGICAL HISTORY Fluid intake: 2 cups coffee in morning, sparkling water, pure water, white wine at night (no more than 2)   Pain with urination: No Fully empty bladder: No Stream: Strong Urgency: No  Frequency: 6-7x  Just in case: Yes  Nocturia: 1-2x  Leakage: Coughing, Sneezing, Laughing, Exercise, Lifting, and Bending forward Pads: Yes Type: Lvl 3, Poise Amount: 2x/day, can be more if doing any of the above  Bladder control (0-10): 6/10    OBSTETRICAL HISTORY Vaginal deliveries: G2P1 Tearing: yes    GYNECOLOGICAL HISTORY Hysterectomy: no Pelvic Organ Prolapse: None Pain with exam: no Heaviness/pressure: no   SUBJECTIVE:  Patient has been doing well and noticed no worsening of urinary leakage with traveling.   PAIN:  Are you having pain? No NPRS scale: 0/10    OBJECTIVE:    COGNITION: Overall cognitive status: Within functional limits for tasks assessed     POSTURE:   Iliac crest height: L iliac crest higher, L shoulder elevation Pelvic obliquity: L posteriorly rotated innominate     RANGE OF MOTION:    (Norm range in degrees)  LEFT 04/24/22 RIGHT 04/24/22  Lumbar forward flexion (65):  WNL    Lumbar extension (30): WNL    Lumbar lateral flexion (25):  WNL WNL  Thoracic and Lumbar rotation (30 degrees):    WNL WNL  Hip Flexion (0-125):   WNL WNL  Hip IR (0-45):  WNL  WNL  Hip ER (0-45):  WNL WNL  Hip Adduction:      Hip Abduction (0-40):  WNL WNL  Hip extension (0-15):     (*= pain, Blank rows = not tested)   STRENGTH: MMT    RLE 04/24/22 LLE 04/24/22  Hip Flexion 5 4  Hip Extension    Hip Abduction     Hip Adduction     Hip ER  5 5  Hip IR  5 5  Knee Extension 5 5  Knee Flexion 5 4  Dorsiflexion     Plantarflexion (seated) 5 5  (*= pain, Blank rows = not tested)   SPECIAL TESTS:   FABER (SN 81): negative B FADIR (SN 94): negative B     PALPATION:  Abdominal:  Diastasis:  2 fingers above umbilicus,  1.5 fingers at umbilicus, and 1 finger below umbilicus, significant doming observed  -no tenderness upon palpation but some muscular tension noted in lower quadrants   EXTERNAL PELVIC EXAM: Patient educated on the purpose of the pelvic exam and articulated understanding; patient consented to the exam verbally.  Breath coordination: present but inconsistent Voluntary Contraction: 4/5 MMT Relaxation: full Perineal movement with sustained IAP increase ("bear down"): elevation then cued again and felt no change, Valsalva noted Perineal movement with rapid IAP increase ("cough"): no change (0= no contraction, 1= flicker, 2= weak squeeze, 3= fair squeeze with lift, 4= good squeeze and lift against resistance, 5= strong squeeze against strong resistance)   TODAY'S TREATMENT  Neuromuscular Re-education: Supine hooklying diaphragmatic breathing with VCs and TCs for downregulation of the nervous system and improved management of IAP  Supine hooklying PFM lengthening techniques with diaphragmatic breathing, VCs and TCs as needed              B Single knee to chest   Double knee to chest              "Happy baby" pose  Butterfly pose "adductor stretch"   Piriformis pose   Childs pose   Right sidelying thoracic rotations, x10, for improved lengthening of the anterior fascial slings    Patient response to interventions: Pt felt more  relaxed and "stretched" after interventions   Patient Education:  Patient provided with HEP including: PFM lengthening and thoracic rotation. Patient educated throughout session on appropriate technique and form using multi-modal cueing, HEP, and activity modification. Patient will benefit from further education in order to maximize compliance and understanding for long-term therapeutic gains.     ASSESSMENT:  Clinical Impression: Patient presents to clinic with excellent motivation to participate in today's session. Pt continues to demonstrate deficits in IAP management, PFM coordination, PFM endurance, pain, LE strength, and posture. Pt has been doing well and no increase in urinary leakage with traveling. Pt required moderate VCs and TCs with PFM lengthening techniques for proper technique and to decrease bodily compensations. Patient responded positively to active and educational interventions. Patient will benefit from skilled therapeutic intervention to address deficits in IAP management, PFM coordination, PFM endurance, pain, LE strength, and posture in order to increase PLOF and improve overall QOL.    Objective Impairments: decreased coordination, decreased endurance, decreased strength, improper body mechanics, and pain.   Activity Limitations: carrying, lifting, bending, squatting, continence, and toileting  Personal Factors: Age, Behavior pattern, Past/current experiences, Time since onset of injury/illness/exacerbation, and 1-2 comorbidities: osteoporosis, hypothyroidism  are also affecting patient's functional outcome.   Rehab Potential: Good  Clinical Decision Making: Evolving/moderate complexity  Evaluation Complexity: Moderate   GOALS: Goals reviewed with patient? Yes  SHORT TERM GOALS: Target date: 05/22/2022  Patient will demonstrate independence with HEP in order to maximize therapeutic gains and improve carryover from physical therapy sessions to ADLs in the home  and community. Baseline: bladder irritants handout Goal status: INITIAL   LONG TERM GOALS: Target date: 06/26/2022   Patient will score >/= 63 on FOTO Urinary Problem  in order to demonstrate improved IAP management, improved PFM coordination/strength, and overall QOL.  Baseline: 56 Goal status: INITIAL  2.  Patient will report less than 5 incidents of stress urinary incontinence over the course of 3 weeks while coughing/sneezing/laughing/physical activity/lifting/bending in order to demonstrate improved PFM coordination, strength, and function for improved overall QOL. Baseline: urinary leakage with all the above  Goal status: INITIAL  3.  Patient will report decreased reliance on protective undergarments as indicated by a 24 hour period to demonstrate improved bladder control and allow for increased participation in activities outside of the home. Baseline: 2x/day or more, Lvl 3 Poise  Goal status: INITIAL  4.  Patient will demonstrate circumferential and sequential contraction of >3/5 MMT, > 5 sec hold x5 and 5 consecutive quick flicks with </= 10 min rest between testing bouts, and relaxation of the PFM coordinated with breath for improved management of intra-abdominal pressure and normal bowel and bladder function without the presence of pain nor incontinence in order to improve participation at home and in the community. Baseline: 4/5 MMT, no quick flicks tested  Goal status: INITIAL  5.  Patient will demonstrate coordinated lengthening and relaxation of PFM with diaphragmatic inhalation in order to decrease spasm and allow for unrestricted elimination of urine/feces for improved overall QOL. Baseline: present but inconsistent  Goal status: INITIAL    PLAN: PT Frequency: 1x/week  PT Duration: 10 weeks  Planned Interventions: Therapeutic exercises, Therapeutic activity, Neuromuscular re-education, Balance training, Gait training, Patient/Family education, Self Care, Joint  mobilization, Cryotherapy, Moist  heat, scar mobilization, Taping, and Manual therapy  Plan For Next Session: how was PFM lengthen? Start deep core   [employer], PT, DPT  05/10/2022, 8:49 AM

## 2022-05-13 ENCOUNTER — Encounter: Payer: Self-pay | Admitting: Family Medicine

## 2022-05-17 ENCOUNTER — Ambulatory Visit: Payer: PPO

## 2022-05-17 DIAGNOSIS — R278 Other lack of coordination: Secondary | ICD-10-CM

## 2022-05-17 DIAGNOSIS — M6281 Muscle weakness (generalized): Secondary | ICD-10-CM

## 2022-05-17 DIAGNOSIS — M6289 Other specified disorders of muscle: Secondary | ICD-10-CM | POA: Diagnosis not present

## 2022-05-17 NOTE — Therapy (Signed)
OUTPATIENT PHYSICAL THERAPY FEMALE PELVIC TREATMENT   Patient Name: Shannon [name] MRN: [id number] DOB:25-Feb-1951, 71 y.o., female Today's Date: 05/17/2022   PT End of Session - 05/17/22 0849     Visit Number 4    Number of Visits 10    Date for PT Re-Evaluation 06/26/22    Authorization Type IE: 04/17/22    PT Start Time 0847    PT Stop Time 0925    PT Time Calculation (min) 38 min    Activity Tolerance Patient tolerated treatment well             Past Medical History:  Diagnosis Date   GERD (gastroesophageal reflux disease)    Hyperlipidemia    Osteoporosis    now has osteopenia   Thyroid disease    Past Surgical History:  Procedure Laterality Date   HAND SURGERY     40 yrs ago   Patient Active Problem List   Diagnosis Date Noted   LFTs abnormal 11/10/2021   Toe deformity 11/10/2021   Cystitis 07/31/2021   Stress incontinence 11/08/2020   Stress 11/08/2020   Skin exam, screening for cancer 11/08/2020   Erythematous rash 10/15/2018   Ganglion cyst of finger 09/24/2018   Eczema of scalp 04/03/2017   Hair loss 06/02/2014   Encounter for general adult medical examination with abnormal findings 11/24/2013   Hyperlipidemia 05/20/2013   Hypothyroidism 05/20/2013   Osteoporosis 05/20/2013   Thyroid nodule 05/20/2013   Carpal tunnel syndrome 02/10/2013   Elevated TSH 02/10/2013    PCP: Leone Haven, MD  REFERRING PROVIDER: Hollice Espy, MD  REFERRING DIAG: N39.3 (ICD-10-CM) - Stress incontinence, female   THERAPY DIAG:  Pelvic floor dysfunction  Muscle weakness (generalized)  Other lack of coordination  Rationale for Evaluation and Treatment: Rehabilitation  ONSET DATE: 1 year                                                                                                                                                                                           PRECAUTIONS: None  WEIGHT BEARING RESTRICTIONS: No  FALLS:  Has patient fallen  in last 6 months? 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 Pt also had a series of UTI's last year which is where she started taking cranberry and probiotic pills. Pt does hold her urine occasionally because she gets distracted or is busy at work. Some days are worse than others in terms of leakage.  PATIENT GOALS: Pt would like to not have urinary leakage and not have to wear incontinence pads    UROLOGICAL HISTORY Fluid intake: 2 cups coffee in morning, sparkling water, pure water, white wine at night (no more than 2)   Pain with urination: No Fully empty bladder: No Stream: Strong Urgency: No  Frequency: 6-7x  Just in case: Yes  Nocturia: 1-2x  Leakage: Coughing, Sneezing, Laughing, Exercise, Lifting, and Bending forward Pads: Yes Type: Lvl 3, Poise Amount: 2x/day, can be more if doing any of the above  Bladder control (0-10): 6/10    OBSTETRICAL HISTORY Vaginal deliveries: G2P1 Tearing: yes    GYNECOLOGICAL HISTORY Hysterectomy: no Pelvic Organ Prolapse: None Pain with exam: no Heaviness/pressure: no   SUBJECTIVE:  Patient has been doing well and practicing HEP. Pt noticed urinary leakage with walking.   PAIN:  Are you having pain? No NPRS scale: 0/10    OBJECTIVE:    COGNITION: Overall cognitive status: Within functional limits for tasks assessed     POSTURE:   Iliac crest height: L iliac crest higher, L shoulder elevation Pelvic obliquity: L posteriorly rotated innominate     RANGE OF MOTION:    (Norm range in degrees)  LEFT 04/24/22 RIGHT 04/24/22  Lumbar forward flexion (65):  WNL    Lumbar extension (30): WNL    Lumbar lateral flexion (25):  WNL WNL  Thoracic and Lumbar rotation (30 degrees):    WNL WNL  Hip Flexion (0-125):   WNL WNL  Hip IR (0-45):   WNL WNL  Hip ER (0-45):  WNL WNL  Hip Adduction:      Hip Abduction (0-40):  WNL WNL  Hip extension (0-15):     (*= pain, Blank rows = not tested)   STRENGTH: MMT    RLE 04/24/22 LLE 04/24/22  Hip Flexion 5 4  Hip Extension    Hip Abduction     Hip Adduction     Hip ER  5 5  Hip IR  5 5  Knee Extension 5 5  Knee Flexion 5 4  Dorsiflexion     Plantarflexion (seated) 5 5  (*= pain, Blank rows = not tested)   SPECIAL TESTS:   FABER (SN 81): negative B FADIR (SN 94): negative B     PALPATION:  Abdominal:  Diastasis:  2 fingers above umbilicus,  1.5 fingers at umbilicus, and 1 finger below umbilicus, significant doming observed  -no tenderness upon palpation but some muscular tension noted in lower quadrants   EXTERNAL PELVIC EXAM: Patient educated on the purpose of the pelvic exam and articulated understanding; patient consented to the exam verbally.  Breath coordination: present but inconsistent Voluntary Contraction: 4/5 MMT Relaxation: full Perineal movement with sustained IAP increase ("bear down"): elevation then cued again and felt no change, Valsalva noted Perineal movement with rapid IAP increase ("cough"): no change (0= no contraction, 1= flicker, 2= weak squeeze, 3= fair squeeze with lift, 4= good squeeze and lift against resistance, 5= strong squeeze against strong resistance)   TODAY'S TREATMENT  Manual Therapy: In R sidelying, L innominate anterior mobs for improved posture and alignment, MET given as part of HEP   Long axis distraction in R sidelying for improved lengthening and alignment   Neuromuscular Re-education: POSTURE:   Iliac crest height: equal height iliac crests compared to (IE: L iliac crest higher, L shoulder elevation) Pelvic obliquity: L posteriorly rotated innominate   Supine hooklying diaphragmatic breathing with VCs and TCs for downregulation of  the nervous system and improved management of IAP  [facility]    Supine hooklying TrA contraction with coordinated exhale    Supine hooklying TrA contraction with bent knee fall outs   Patient response to interventions: Pt responded well to "tightening of the jeans"   Patient Education:  Patient provided with HEP including: supine TrA activation and with bent knee fallouts, anterior innominate MET. Patient educated throughout session on appropriate technique and form using multi-modal cueing, HEP, and activity modification. Patient will benefit from further education in order to maximize compliance and understanding for long-term therapeutic gains.     ASSESSMENT:  Clinical Impression: Patient presents to clinic with excellent motivation to participate in today's session. Pt continues to demonstrate deficits in IAP management, PFM coordination, PFM endurance, pain, LE strength, and posture. Pt has been doing well and has continued to notice to urinary leakage with physical activity such as walking. Brief discussion on length of time it may take to notice improvements at the pelvic floor and IAP management. Pt's posture has improved since IE (L iliac crest high and L shoulder elevation). Pt now with equal iliac crests and shoulder height. Pt responded well to manual interventions at the innominate. Pt required moderate VCs and TCs with beginning TrA activation to decrease bodily compensations (decrease activation of superficial abdominal musculature and significant PFM activation). With continued time, Pt able to demonstrate understanding. Patient responded positively to manual, active, and educational interventions. Patient will continue to benefit from skilled therapeutic intervention to address deficits in IAP management, PFM coordination, PFM endurance, pain, LE strength, and posture in order to increase PLOF and improve overall QOL.    Objective Impairments: decreased coordination, decreased endurance, decreased strength, improper body mechanics, and  pain.   Activity Limitations: carrying, lifting, bending, squatting, continence, and toileting  Personal Factors: Age, Behavior pattern, Past/current experiences, Time since onset of injury/illness/exacerbation, and 1-2 comorbidities: osteoporosis, hypothyroidism  are also affecting patient's functional outcome.   Rehab Potential: Good  Clinical Decision Making: Evolving/moderate complexity  Evaluation Complexity: Moderate   GOALS: Goals reviewed with patient? Yes  SHORT TERM GOALS: Target date: 05/22/2022  Patient will demonstrate independence with HEP in order to maximize therapeutic gains and improve carryover from physical therapy sessions to ADLs in the home and community. Baseline: bladder irritants handout Goal status: INITIAL   LONG TERM GOALS: Target date: 06/26/2022   Patient will score >/= 63 on FOTO Urinary Problem  in order to demonstrate improved IAP management, improved PFM coordination/strength, and overall QOL.  Baseline: 56 Goal status: INITIAL  2.  Patient will report less than 5 incidents of stress urinary incontinence over the course of 3 weeks while coughing/sneezing/laughing/physical activity/lifting/bending in order to demonstrate improved PFM coordination, strength, and function for improved overall QOL. Baseline: urinary leakage with all the above  Goal status: INITIAL  3.  Patient will report decreased reliance on protective undergarments as indicated by a 24 hour period to demonstrate improved bladder control and allow for increased participation in activities outside of the home. Baseline: 2x/day or more, Lvl 3 Poise  Goal status: INITIAL  4.  Patient will demonstrate circumferential and sequential contraction of >3/5 MMT, > 5 sec hold x5 and 5 consecutive quick flicks with </= 10 min rest between testing bouts, and relaxation of the PFM coordinated with breath for improved management of intra-abdominal pressure and normal bowel and bladder function  without the presence of pain nor incontinence in order to improve participation at home and in  the community. Baseline: 4/5 MMT, no quick flicks tested  Goal status: INITIAL  5.  Patient will demonstrate coordinated lengthening and relaxation of PFM with diaphragmatic inhalation in order to decrease spasm and allow for unrestricted elimination of urine/feces for improved overall QOL. Baseline: present but inconsistent  Goal status: INITIAL    PLAN: PT Frequency: 1x/week  PT Duration: 10 weeks  Planned Interventions: Therapeutic exercises, Therapeutic activity, Neuromuscular re-education, Balance training, Gait training, Patient/Family education, Self Care, Joint mobilization, Cryotherapy, Moist heat, scar mobilization, Taping, and Manual therapy  Plan For Next Session: how was deep core and innominate? Continue deep core   Ikeya Brockel, PT, DPT  05/17/2022, 8:49 AM

## 2022-05-23 DIAGNOSIS — M9901 Segmental and somatic dysfunction of cervical region: Secondary | ICD-10-CM | POA: Diagnosis not present

## 2022-05-23 DIAGNOSIS — M9902 Segmental and somatic dysfunction of thoracic region: Secondary | ICD-10-CM | POA: Diagnosis not present

## 2022-05-23 DIAGNOSIS — M5412 Radiculopathy, cervical region: Secondary | ICD-10-CM | POA: Diagnosis not present

## 2022-05-23 DIAGNOSIS — M5033 Other cervical disc degeneration, cervicothoracic region: Secondary | ICD-10-CM | POA: Diagnosis not present

## 2022-05-24 ENCOUNTER — Ambulatory Visit: Payer: PPO | Attending: Urology

## 2022-05-24 DIAGNOSIS — M6289 Other specified disorders of muscle: Secondary | ICD-10-CM | POA: Insufficient documentation

## 2022-05-24 DIAGNOSIS — R278 Other lack of coordination: Secondary | ICD-10-CM | POA: Insufficient documentation

## 2022-05-24 DIAGNOSIS — M6281 Muscle weakness (generalized): Secondary | ICD-10-CM | POA: Diagnosis not present

## 2022-05-24 NOTE — Therapy (Signed)
OUTPATIENT PHYSICAL THERAPY FEMALE PELVIC TREATMENT   Patient Name: Shannon [name] MRN: [id number] DOB:[birth year], 71 y.o., female Today's Date: 05/24/2022   PT End of Session - 05/24/22 0848     Visit Number 5    Number of Visits 10    Date for PT Re-Evaluation 06/26/22    Authorization Type IE: 04/17/22    PT Start Time 0847    PT Stop Time 0925    PT Time Calculation (min) 38 min    Activity Tolerance Patient tolerated treatment well             Past Medical History:  Diagnosis Date   GERD (gastroesophageal reflux disease)    Hyperlipidemia    Osteoporosis    now has osteopenia   Thyroid disease    Past Surgical History:  Procedure Laterality Date   HAND SURGERY     40 yrs ago   Patient Active Problem List   Diagnosis Date Noted   LFTs abnormal 11/10/2021   Toe deformity 11/10/2021   Cystitis 07/31/2021   Stress incontinence 11/08/2020   Stress 11/08/2020   Skin exam, screening for cancer 11/08/2020   Erythematous rash 10/15/2018   Ganglion cyst of finger 09/24/2018   Eczema of scalp 04/03/2017   Hair loss 06/02/2014   Encounter for general adult medical examination with abnormal findings 11/24/2013   Hyperlipidemia 05/20/2013   Hypothyroidism 05/20/2013   Osteoporosis 05/20/2013   Thyroid nodule 05/20/2013   Carpal tunnel syndrome 02/10/2013   Elevated TSH 02/10/2013    PCP: Leone Haven, MD  REFERRING PROVIDER: Hollice Espy, MD  REFERRING DIAG: N39.3 (ICD-10-CM) - Stress incontinence, female   THERAPY DIAG:  Pelvic floor dysfunction  Muscle weakness (generalized)  Other lack of coordination  Rationale for Evaluation and Treatment: Rehabilitation  ONSET DATE: 1 year                                                                                                                                                                                           PRECAUTIONS: None  WEIGHT BEARING RESTRICTIONS: No  FALLS:  Has patient fallen  in last 6 months? 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 Pt also had a series of UTI's last year which is where she started taking cranberry and probiotic pills. Pt does hold her urine occasionally because she gets distracted or is busy at work. Some days are worse than others in terms of leakage.  PATIENT GOALS: Pt would like to not have urinary leakage and not have to wear incontinence pads    UROLOGICAL HISTORY Fluid intake: 2 cups coffee in morning, sparkling water, pure water, white wine at night (no more than 2)   Pain with urination: No Fully empty bladder: No Stream: Strong Urgency: No  Frequency: 6-7x  Just in case: Yes  Nocturia: 1-2x  Leakage: Coughing, Sneezing, Laughing, Exercise, Lifting, and Bending forward Pads: Yes Type: Lvl 3, Poise Amount: 2x/day, can be more if doing any of the above  Bladder control (0-10): 6/10    OBSTETRICAL HISTORY Vaginal deliveries: G2P1 Tearing: yes    GYNECOLOGICAL HISTORY Hysterectomy: no Pelvic Organ Prolapse: None Pain with exam: no Heaviness/pressure: no   SUBJECTIVE:  Patient has been doing well and practicing HEP. Pt did notice a day with increased urinary leakage.    PAIN:  Are you having pain? No NPRS scale: 0/10    OBJECTIVE:    COGNITION: Overall cognitive status: Within functional limits for tasks assessed     POSTURE:   Iliac crest height: L iliac crest higher, L shoulder elevation Pelvic obliquity: L posteriorly rotated innominate     RANGE OF MOTION:    (Norm range in degrees)  LEFT 04/24/22 RIGHT 04/24/22  Lumbar forward flexion (65):  WNL    Lumbar extension (30): WNL    Lumbar lateral flexion (25):  WNL WNL  Thoracic and Lumbar rotation (30 degrees):    WNL WNL  Hip Flexion (0-125):   WNL WNL  Hip  IR (0-45):  WNL WNL  Hip ER (0-45):  WNL WNL  Hip Adduction:      Hip Abduction (0-40):  WNL WNL  Hip extension (0-15):     (*= pain, Blank rows = not tested)   STRENGTH: MMT    RLE 04/24/22 LLE 04/24/22  Hip Flexion 5 4  Hip Extension    Hip Abduction     Hip Adduction     Hip ER  5 5  Hip IR  5 5  Knee Extension 5 5  Knee Flexion 5 4  Dorsiflexion     Plantarflexion (seated) 5 5  (*= pain, Blank rows = not tested)   SPECIAL TESTS:   FABER (SN 81): negative B FADIR (SN 94): negative B     PALPATION:  Abdominal:  Diastasis:  2 fingers above umbilicus,  1.5 fingers at umbilicus, and 1 finger below umbilicus, significant doming observed  -no tenderness upon palpation but some muscular tension noted in lower quadrants   EXTERNAL PELVIC EXAM: Patient educated on the purpose of the pelvic exam and articulated understanding; patient consented to the exam verbally.  Breath coordination: present but inconsistent Voluntary Contraction: 4/5 MMT Relaxation: full Perineal movement with sustained IAP increase ("bear down"): elevation then cued again and felt no change, Valsalva noted Perineal movement with rapid IAP increase ("cough"): no change (0= no contraction, 1= flicker, 2= weak squeeze, 3= fair squeeze with lift, 4= good squeeze and lift against resistance, 5= strong squeeze against strong resistance)   TODAY'S TREATMENT  Neuromuscular Re-education: Discussion on the sympathetic vs parasympathetic nervous system (increased cortisol levels) affecting PFM coordination. Pt verbalized understanding.   Seated diaphragmatic breathing with VCs and TCs for downregulation of the nervous system and improved management of IAP  Seated TrA contraction with coordinated breath for improved IAP management, VCs and TCs required   Quadruped diaphragmatic breathing with VCs and TCs for downregulation of the nervous system and improved management  of IAP  Quadruped TrA contraction with  coordinated breath for improved IAP management, VCs and TCs required    Patient response to interventions: Pt able to differentiate between TrA and PFM when contracting    Patient Education:  Patient provided with HEP including: seated TrA activation, seated TrA with march, quadruped breathing then TrA activation. Patient educated throughout session on appropriate technique and form using multi-modal cueing, HEP, and activity modification. Patient will benefit from further education in order to maximize compliance and understanding for long-term therapeutic gains.     ASSESSMENT:  Clinical Impression: Patient presents to clinic with excellent motivation to participate in today's session. Pt continues to demonstrate deficits in IAP management, PFM coordination, PFM endurance, pain, LE strength, and posture. Brief discussion on sympathetic nervous system and increased cortisol levels affecting PFM coordination. Pt verbalized understanding. Pt required moderate VCs and TCs for increased challenge of TrA activation in various positions. Patient responded positively to active and educational interventions. Patient will continue to benefit from skilled therapeutic intervention to address deficits in IAP management, PFM coordination, PFM endurance, pain, LE strength, and posture in order to increase PLOF and improve overall QOL.    Objective Impairments: decreased coordination, decreased endurance, decreased strength, improper body mechanics, and pain.   Activity Limitations: carrying, lifting, bending, squatting, continence, and toileting  Personal Factors: Age, Behavior pattern, Past/current experiences, Time since onset of injury/illness/exacerbation, and 1-2 comorbidities: osteoporosis, hypothyroidism  are also affecting patient's functional outcome.   Rehab Potential: Good  Clinical Decision Making: Evolving/moderate complexity  Evaluation Complexity: Moderate   GOALS: Goals reviewed  with patient? Yes  SHORT TERM GOALS: Target date: 05/22/2022  Patient will demonstrate independence with HEP in order to maximize therapeutic gains and improve carryover from physical therapy sessions to ADLs in the home and community. Baseline: bladder irritants handout Goal status: INITIAL   LONG TERM GOALS: Target date: 06/26/2022   Patient will score >/= 63 on FOTO Urinary Problem  in order to demonstrate improved IAP management, improved PFM coordination/strength, and overall QOL.  Baseline: 56 Goal status: INITIAL  2.  Patient will report less than 5 incidents of stress urinary incontinence over the course of 3 weeks while coughing/sneezing/laughing/physical activity/lifting/bending in order to demonstrate improved PFM coordination, strength, and function for improved overall QOL. Baseline: urinary leakage with all the above  Goal status: INITIAL  3.  Patient will report decreased reliance on protective undergarments as indicated by a 24 hour period to demonstrate improved bladder control and allow for increased participation in activities outside of the home. Baseline: 2x/day or more, Lvl 3 Poise  Goal status: INITIAL  4.  Patient will demonstrate circumferential and sequential contraction of >3/5 MMT, > 5 sec hold x5 and 5 consecutive quick flicks with </= 10 min rest between testing bouts, and relaxation of the PFM coordinated with breath for improved management of intra-abdominal pressure and normal bowel and bladder function without the presence of pain nor incontinence in order to improve participation at home and in the community. Baseline: 4/5 MMT, no quick flicks tested  Goal status: INITIAL  5.  Patient will demonstrate coordinated lengthening and relaxation of PFM with diaphragmatic inhalation in order to decrease spasm and allow for unrestricted elimination of urine/feces for improved overall QOL. Baseline: present but inconsistent  Goal status: INITIAL    PLAN: PT  Frequency: 1x/week  PT Duration: 10 weeks  Planned Interventions: Therapeutic exercises, Therapeutic activity, Neuromuscular re-education, Balance training, Gait training, Patient/Family education, Self Care, Joint mobilization,  Cryotherapy, Moist heat, scar mobilization, Taping, and Manual therapy  Plan For Next Session: check hip, how was deep core this week? More sitting stuff or standing at wall    [facility], PT, DPT  05/24/2022, 8:49 AM

## 2022-05-29 ENCOUNTER — Ambulatory Visit: Payer: PPO

## 2022-05-31 ENCOUNTER — Ambulatory Visit: Payer: PPO

## 2022-05-31 DIAGNOSIS — M6289 Other specified disorders of muscle: Secondary | ICD-10-CM

## 2022-05-31 DIAGNOSIS — M6281 Muscle weakness (generalized): Secondary | ICD-10-CM

## 2022-05-31 DIAGNOSIS — R278 Other lack of coordination: Secondary | ICD-10-CM

## 2022-05-31 NOTE — Therapy (Signed)
OUTPATIENT PHYSICAL THERAPY FEMALE PELVIC TREATMENT   Patient Name: Shannon [name] MRN: [id number] DOB:01-Aug-1950, 71 y.o., female Today's Date: 05/31/2022   PT End of Session - 05/31/22 1017     Visit Number 6    Number of Visits 10    Date for PT Re-Evaluation 06/26/22    Authorization Type IE: 04/17/22    PT Start Time 1015    PT Stop Time 1052    PT Time Calculation (min) 37 min    Activity Tolerance Patient tolerated treatment well             Past Medical History:  Diagnosis Date   GERD (gastroesophageal reflux disease)    Hyperlipidemia    Osteoporosis    now has osteopenia   Thyroid disease    Past Surgical History:  Procedure Laterality Date   HAND SURGERY     40 yrs ago   Patient Active Problem List   Diagnosis Date Noted   LFTs abnormal 11/10/2021   Toe deformity 11/10/2021   Cystitis 07/31/2021   Stress incontinence 11/08/2020   Stress 11/08/2020   Skin exam, screening for cancer 11/08/2020   Erythematous rash 10/15/2018   Ganglion cyst of finger 09/24/2018   Eczema of scalp 04/03/2017   Hair loss 06/02/2014   Encounter for general adult medical examination with abnormal findings 11/24/2013   Hyperlipidemia 05/20/2013   Hypothyroidism 05/20/2013   Osteoporosis 05/20/2013   Thyroid nodule 05/20/2013   Carpal tunnel syndrome 02/10/2013   Elevated TSH 02/10/2013    PCP: Leone Haven, MD  REFERRING PROVIDER: Hollice Espy, MD  REFERRING DIAG: N39.3 (ICD-10-CM) - Stress incontinence, female   THERAPY DIAG:  Pelvic floor dysfunction  Muscle weakness (generalized)  Other lack of coordination  Rationale for Evaluation and Treatment: Rehabilitation  ONSET DATE: 1 year                                                                                                                                                                                           PRECAUTIONS: None  WEIGHT BEARING RESTRICTIONS: No  FALLS:  Has patient fallen  in last 6 months? 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 Pt also had a series of UTI's last year which is where she started taking cranberry and probiotic pills. Pt does hold her urine occasionally because she gets distracted or is busy at work. Some days are worse than others in terms of leakage.  PATIENT GOALS: Pt would like to not have urinary leakage and not have to wear incontinence pads    UROLOGICAL HISTORY Fluid intake: 2 cups coffee in morning, sparkling water, pure water, white wine at night (no more than 2)   Pain with urination: No Fully empty bladder: No Stream: Strong Urgency: No  Frequency: 6-7x  Just in case: Yes  Nocturia: 1-2x  Leakage: Coughing, Sneezing, Laughing, Exercise, Lifting, and Bending forward Pads: Yes Type: Lvl 3, Poise Amount: 2x/day, can be more if doing any of the above  Bladder control (0-10): 6/10    OBSTETRICAL HISTORY Vaginal deliveries: G2P1 Tearing: yes    GYNECOLOGICAL HISTORY Hysterectomy: no Pelvic Organ Prolapse: None Pain with exam: no Heaviness/pressure: no   SUBJECTIVE:  Patient has been doing well and practicing HEP. Pt has been noticing urinary leakage with lifting and coughing fits.    PAIN:  Are you having pain? No NPRS scale: 0/10    OBJECTIVE:    COGNITION: Overall cognitive status: Within functional limits for tasks assessed     POSTURE:   Iliac crest height: L iliac crest higher, L shoulder elevation Pelvic obliquity: L posteriorly rotated innominate     RANGE OF MOTION:    (Norm range in degrees)  LEFT 04/24/22 RIGHT 04/24/22  Lumbar forward flexion (65):  WNL    Lumbar extension (30): WNL    Lumbar lateral flexion (25):  WNL WNL  Thoracic and Lumbar rotation (30 degrees):    WNL WNL  Hip Flexion (0-125):    WNL WNL  Hip IR (0-45):  WNL WNL  Hip ER (0-45):  WNL WNL  Hip Adduction:      Hip Abduction (0-40):  WNL WNL  Hip extension (0-15):     (*= pain, Blank rows = not tested)   STRENGTH: MMT    RLE 04/24/22 LLE 04/24/22  Hip Flexion 5 4  Hip Extension    Hip Abduction     Hip Adduction     Hip ER  5 5  Hip IR  5 5  Knee Extension 5 5  Knee Flexion 5 4  Dorsiflexion     Plantarflexion (seated) 5 5  (*= pain, Blank rows = not tested)   SPECIAL TESTS:   FABER (SN 81): negative B FADIR (SN 94): negative B     PALPATION:  Abdominal:  Diastasis:  2 fingers above umbilicus,  1.5 fingers at umbilicus, and 1 finger below umbilicus, significant doming observed  -no tenderness upon palpation but some muscular tension noted in lower quadrants   EXTERNAL PELVIC EXAM: Patient educated on the purpose of the pelvic exam and articulated understanding; patient consented to the exam verbally.  Breath coordination: present but inconsistent Voluntary Contraction: 4/5 MMT Relaxation: full Perineal movement with sustained IAP increase ("bear down"): elevation then cued again and felt no change, Valsalva noted Perineal movement with rapid IAP increase ("cough"): no change (0= no contraction, 1= flicker, 2= weak squeeze, 3= fair squeeze with lift, 4= good squeeze and lift against resistance, 5= strong squeeze against strong resistance)    TODAY'S TREATMENT  Neuromuscular Re-education: Body mechanics/positional techniques - with weighted box then with 10lb weight in box  Practiced squat and semi lunge squat position  Cueing for breathing required   Standing wall plank, 2x 30 secs, with coordinated breathing for improved IAP management, VCs and TCs required   Standing modified bird dog interchanging between UE and LE for improved IAP management, VCs and TCs required   Standing  wall squats, x15,  with coordinated breath for improved IAP management, VCs and TCs required   Patient  response to interventions: Pt able to differentiate between TrA and PFM when contracting    Patient Education:  Patient provided with HEP including: standing TrA activation. Patient educated throughout session on appropriate technique and form using multi-modal cueing, HEP, and activity modification. Patient will benefit from further education in order to maximize compliance and understanding for long-term therapeutic gains.     ASSESSMENT:  Clinical Impression: Patient presents to clinic with excellent motivation to participate in today's session. Pt continues to demonstrate deficits in IAP management, PFM coordination, PFM endurance, pain, LE strength, and posture. Part of session focused on body mechanics/lifting mechanics (squat vs lunge squat) especially when at work. Pt having to lift up to 15 lbs of weight and practiced with weighted box and 10lb weight added. Pt required verbal cueing for proper technique and coordinating with breath. Pt required moderate VCs and TCs for increased challenge of TrA activation in standing. Patient responded positively to active and educational interventions. Patient will continue to benefit from skilled therapeutic intervention to address deficits in IAP management, PFM coordination, PFM endurance, pain, LE strength, and posture in order to increase PLOF and improve overall QOL.    Objective Impairments: decreased coordination, decreased endurance, decreased strength, improper body mechanics, and pain.   Activity Limitations: carrying, lifting, bending, squatting, continence, and toileting  Personal Factors: Age, Behavior pattern, Past/current experiences, Time since onset of injury/illness/exacerbation, and 1-2 comorbidities: osteoporosis, hypothyroidism  are also affecting patient's functional outcome.   Rehab Potential: Good  Clinical Decision Making: Evolving/moderate complexity  Evaluation Complexity: Moderate   GOALS: Goals reviewed with  patient? Yes  SHORT TERM GOALS: Target date: 05/22/2022  Patient will demonstrate independence with HEP in order to maximize therapeutic gains and improve carryover from physical therapy sessions to ADLs in the home and community. Baseline: bladder irritants handout Goal status: INITIAL   LONG TERM GOALS: Target date: 06/26/2022   Patient will score >/= 63 on FOTO Urinary Problem  in order to demonstrate improved IAP management, improved PFM coordination/strength, and overall QOL.  Baseline: 56 Goal status: INITIAL  2.  Patient will report less than 5 incidents of stress urinary incontinence over the course of 3 weeks while coughing/sneezing/laughing/physical activity/lifting/bending in order to demonstrate improved PFM coordination, strength, and function for improved overall QOL. Baseline: urinary leakage with all the above  Goal status: INITIAL  3.  Patient will report decreased reliance on protective undergarments as indicated by a 24 hour period to demonstrate improved bladder control and allow for increased participation in activities outside of the home. Baseline: 2x/day or more, Lvl 3 Poise  Goal status: INITIAL  4.  Patient will demonstrate circumferential and sequential contraction of >3/5 MMT, > 5 sec hold x5 and 5 consecutive quick flicks with </= 10 min rest between testing bouts, and relaxation of the PFM coordinated with breath for improved management of intra-abdominal pressure and normal bowel and bladder function without the presence of pain nor incontinence in order to improve participation at home and in the community. Baseline: 4/5 MMT, no quick flicks tested  Goal status: INITIAL  5.  Patient will demonstrate coordinated lengthening and relaxation of PFM with diaphragmatic inhalation in order to decrease spasm and allow for unrestricted elimination of urine/feces for improved overall QOL. Baseline: present but inconsistent  Goal status: INITIAL    PLAN: PT  Frequency: 1x/week  PT Duration: 10 weeks  Planned  Interventions: Therapeutic exercises, Therapeutic activity, Neuromuscular re-education, Balance training, Gait training, Patient/Family education, Self Care, Joint mobilization, Cryotherapy, Moist heat, scar mobilization, Taping, and Manual therapy  Plan For Next Session: check hip, how was deep core this week? Pallof press, bird dog/dead bug, how was lifting mechanics?    Orvile Corona, PT, DPT  05/31/2022, 10:53 AM

## 2022-06-04 ENCOUNTER — Ambulatory Visit: Payer: PPO

## 2022-06-11 DIAGNOSIS — M5412 Radiculopathy, cervical region: Secondary | ICD-10-CM | POA: Diagnosis not present

## 2022-06-11 DIAGNOSIS — M9902 Segmental and somatic dysfunction of thoracic region: Secondary | ICD-10-CM | POA: Diagnosis not present

## 2022-06-11 DIAGNOSIS — M9901 Segmental and somatic dysfunction of cervical region: Secondary | ICD-10-CM | POA: Diagnosis not present

## 2022-06-11 DIAGNOSIS — M5033 Other cervical disc degeneration, cervicothoracic region: Secondary | ICD-10-CM | POA: Diagnosis not present

## 2022-06-12 ENCOUNTER — Ambulatory Visit: Payer: PPO

## 2022-06-12 DIAGNOSIS — R278 Other lack of coordination: Secondary | ICD-10-CM

## 2022-06-12 DIAGNOSIS — M6289 Other specified disorders of muscle: Secondary | ICD-10-CM | POA: Diagnosis not present

## 2022-06-12 DIAGNOSIS — M6281 Muscle weakness (generalized): Secondary | ICD-10-CM

## 2022-06-12 NOTE — Therapy (Signed)
OUTPATIENT PHYSICAL THERAPY FEMALE PELVIC TREATMENT   Patient Name: Shannon [name] MRN: [id number] DOB:1950-12-03, 71 y.o., female Today's Date: 06/12/2022   PT End of Session - 06/12/22 0853     Visit Number 7    Number of Visits 10    Date for PT Re-Evaluation 06/26/22    Authorization Type IE: 04/17/22    PT Start Time 0852    PT Stop Time 0925    PT Time Calculation (min) 33 min    Activity Tolerance Patient tolerated treatment well             Past Medical History:  Diagnosis Date   GERD (gastroesophageal reflux disease)    Hyperlipidemia    Osteoporosis    now has osteopenia   Thyroid disease    Past Surgical History:  Procedure Laterality Date   HAND SURGERY     40 yrs ago   Patient Active Problem List   Diagnosis Date Noted   LFTs abnormal 11/10/2021   Toe deformity 11/10/2021   Cystitis 07/31/2021   Stress incontinence 11/08/2020   Stress 11/08/2020   Skin exam, screening for cancer 11/08/2020   Erythematous rash 10/15/2018   Ganglion cyst of finger 09/24/2018   Eczema of scalp 04/03/2017   Hair loss 06/02/2014   Encounter for general adult medical examination with abnormal findings 11/24/2013   Hyperlipidemia 05/20/2013   Hypothyroidism 05/20/2013   Osteoporosis 05/20/2013   Thyroid nodule 05/20/2013   Carpal tunnel syndrome 02/10/2013   Elevated TSH 02/10/2013    PCP: Leone Haven, MD  REFERRING PROVIDER: Hollice Espy, MD  REFERRING DIAG: N39.3 (ICD-10-CM) - Stress incontinence, female   THERAPY DIAG:  Pelvic floor dysfunction  Muscle weakness (generalized)  Other lack of coordination  Rationale for Evaluation and Treatment: Rehabilitation  ONSET DATE: 1 year                                                                                                                                                                                           PRECAUTIONS: None  WEIGHT BEARING RESTRICTIONS: No  FALLS:  Has patient fallen  in last 6 months? 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 Pt also had a series of UTI's last year which is where she started taking cranberry and probiotic pills. Pt does hold her urine occasionally because she gets distracted or is busy at work. Some days are worse than others in terms of leakage.  PATIENT GOALS: Pt would like to not have urinary leakage and not have to wear incontinence pads    UROLOGICAL HISTORY Fluid intake: 2 cups coffee in morning, sparkling water, pure water, white wine at night (no more than 2)   Pain with urination: No Fully empty bladder: No Stream: Strong Urgency: No  Frequency: 6-7x  Just in case: Yes  Nocturia: 1-2x  Leakage: Coughing, Sneezing, Laughing, Exercise, Lifting, and Bending forward Pads: Yes Type: Lvl 3, Poise Amount: 2x/day, can be more if doing any of the above  Bladder control (0-10): 6/10    OBSTETRICAL HISTORY Vaginal deliveries: G2P1 Tearing: yes    GYNECOLOGICAL HISTORY Hysterectomy: no Pelvic Organ Prolapse: None Pain with exam: no Heaviness/pressure: no   SUBJECTIVE:  Pt arrived 7 mins after scheduled time. Patient has been doing well and during her most recent trip she did not have any discomfort in the L hip/gluteal area. That is very different for the patient and is excited about that. Pt has noticed continued urinary leakage with walking but not as much as it used to be.    PAIN:  Are you having pain? No NPRS scale: 0/10    OBJECTIVE:    COGNITION: Overall cognitive status: Within functional limits for tasks assessed     POSTURE:   Iliac crest height: L iliac crest higher, L shoulder elevation Pelvic obliquity: L posteriorly rotated innominate     RANGE OF MOTION:    (Norm range in degrees)  LEFT 04/24/22  RIGHT 04/24/22  Lumbar forward flexion (65):  WNL    Lumbar extension (30): WNL    Lumbar lateral flexion (25):  WNL WNL  Thoracic and Lumbar rotation (30 degrees):    WNL WNL  Hip Flexion (0-125):   WNL WNL  Hip IR (0-45):  WNL WNL  Hip ER (0-45):  WNL WNL  Hip Adduction:      Hip Abduction (0-40):  WNL WNL  Hip extension (0-15):     (*= pain, Blank rows = not tested)   STRENGTH: MMT    RLE 04/24/22 LLE 04/24/22  Hip Flexion 5 4  Hip Extension    Hip Abduction     Hip Adduction     Hip ER  5 5  Hip IR  5 5  Knee Extension 5 5  Knee Flexion 5 4  Dorsiflexion     Plantarflexion (seated) 5 5  (*= pain, Blank rows = not tested)   SPECIAL TESTS:   FABER (SN 81): negative B FADIR (SN 94): negative B     PALPATION:  Abdominal:  Diastasis:  2 fingers above umbilicus,  1.5 fingers at umbilicus, and 1 finger below umbilicus, significant doming observed  -no tenderness upon palpation but some muscular tension noted in lower quadrants   EXTERNAL PELVIC EXAM: Patient educated on the purpose of the pelvic exam and articulated understanding; patient consented to the exam verbally.  Breath coordination: present but inconsistent Voluntary Contraction: 4/5 MMT Relaxation: full Perineal movement with sustained IAP increase ("bear down"): elevation then cued again and felt no change, Valsalva noted Perineal movement with rapid IAP increase ("cough"): no change (0= no contraction, 1= flicker, 2= weak squeeze, 3= fair squeeze with lift, 4= good squeeze and lift against resistance, 5= strong squeeze against strong resistance)    TODAY'S TREATMENT  Neuromuscular Re-education: Review on urinary incontinence episodes and discussion on minimizing "just in case" visits to the bathroom in relation to Bradley's loops Pt notices more urinary leakage with increased  walking but it varies because at times she does not  However, Pt verbalizes that incontinence is much less than when first  starting PFPT   Discussion on bladder irritants and how that can increase urinary incontinence. Pt has educational handout on bladder irritants and verbalized understanding.   Supine 90/90 isometric hold with coordinated breath, 3 x30 secs, for improved IAP management, VCs and TCs required  Increased challenge with UE hold   Standing modified bird dog with coordinated breath for improved IAP management, VCs and TCs required    Patient response to interventions: Pt able to feel the deep core activation during above activities    Patient Education:  Patient provided with HEP including: supine 90/90 hold, standing bird dog. Patient educated throughout session on appropriate technique and form using multi-modal cueing, HEP, and activity modification. Patient will benefit from further education in order to maximize compliance and understanding for long-term therapeutic gains.      ASSESSMENT:  Clinical Impression: Patient presents to clinic with excellent motivation to participate in today's session. Pt continues to demonstrate deficits in IAP management, PFM coordination, PFM endurance, pain, LE strength, and posture. Discussion on when Pt notices more urinary incontinence to tailor PFPT session around certain SUI. Pt verbalized understanding about continuing to challenge the deep core with more emphasis on single leg activities bilaterally to aid in decreasing urinary incontinence while walking. Pt required moderate VCs and TCs for increased challenge of TrA activation in various positions to decrease bodily compensations. Patient responded positively to active and educational interventions. Patient will continue to benefit from skilled therapeutic intervention to address deficits in IAP management, PFM coordination, PFM endurance, pain, LE strength, and posture in order to increase PLOF and improve overall QOL.    Objective Impairments: decreased coordination, decreased endurance, decreased  strength, improper body mechanics, and pain.   Activity Limitations: carrying, lifting, bending, squatting, continence, and toileting  Personal Factors: Age, Behavior pattern, Past/current experiences, Time since onset of injury/illness/exacerbation, and 1-2 comorbidities: osteoporosis, hypothyroidism  are also affecting patient's functional outcome.   Rehab Potential: Good  Clinical Decision Making: Evolving/moderate complexity  Evaluation Complexity: Moderate   GOALS: Goals reviewed with patient? Yes  SHORT TERM GOALS: Target date: 05/22/2022  Patient will demonstrate independence with HEP in order to maximize therapeutic gains and improve carryover from physical therapy sessions to ADLs in the home and community. Baseline: bladder irritants handout Goal status: INITIAL   LONG TERM GOALS: Target date: 06/26/2022   Patient will score >/= 63 on FOTO Urinary Problem  in order to demonstrate improved IAP management, improved PFM coordination/strength, and overall QOL.  Baseline: 56 Goal status: INITIAL  2.  Patient will report less than 5 incidents of stress urinary incontinence over the course of 3 weeks while coughing/sneezing/laughing/physical activity/lifting/bending in order to demonstrate improved PFM coordination, strength, and function for improved overall QOL. Baseline: urinary leakage with all the above  Goal status: INITIAL  3.  Patient will report decreased reliance on protective undergarments as indicated by a 24 hour period to demonstrate improved bladder control and allow for increased participation in activities outside of the home. Baseline: 2x/day or more, Lvl 3 Poise   Goal status: INITIAL  4.  Patient will demonstrate circumferential and sequential contraction of >3/5 MMT, > 5 sec hold x5 and 5 consecutive quick flicks with </= 10 min rest between testing bouts, and relaxation of the PFM coordinated with breath for improved management of intra-abdominal pressure  and normal bowel and bladder function  without the presence of pain nor incontinence in order to improve participation at home and in the community. Baseline: 4/5 MMT, no quick flicks tested  Goal status: INITIAL  5.  Patient will demonstrate coordinated lengthening and relaxation of PFM with diaphragmatic inhalation in order to decrease spasm and allow for unrestricted elimination of urine/feces for improved overall QOL. Baseline: present but inconsistent  Goal status: INITIAL    PLAN: PT Frequency: 1x/week  PT Duration: 10 weeks  Planned Interventions: Therapeutic exercises, Therapeutic activity, Neuromuscular re-education, Balance training, Gait training, Patient/Family education, Self Care, Joint mobilization, Cryotherapy, Moist heat, scar mobilization, Taping, and Manual therapy  Plan For Next Session:  check hip, bridging with one leg, single leg squat, SLS, Pallof press,   Antwaine Boomhower, PT, DPT  06/12/2022, 9:19 AM

## 2022-06-19 ENCOUNTER — Ambulatory Visit: Payer: PPO

## 2022-06-19 DIAGNOSIS — R278 Other lack of coordination: Secondary | ICD-10-CM

## 2022-06-19 DIAGNOSIS — M6289 Other specified disorders of muscle: Secondary | ICD-10-CM

## 2022-06-19 DIAGNOSIS — M6281 Muscle weakness (generalized): Secondary | ICD-10-CM

## 2022-06-19 NOTE — Therapy (Signed)
OUTPATIENT PHYSICAL THERAPY FEMALE PELVIC TREATMENT   Patient Name: Shannon [name] MRN: [id number] DOB:03/28/1951, 71 y.o., female Today's Date: 06/19/2022   PT End of Session - 06/19/22 0845     Visit Number 8    Number of Visits 10    Date for PT Re-Evaluation 06/26/22    Authorization Type IE: 04/17/22    PT Start Time 0845    PT Stop Time 0925    PT Time Calculation (min) 40 min    Activity Tolerance Patient tolerated treatment well             Past Medical History:  Diagnosis Date   GERD (gastroesophageal reflux disease)    Hyperlipidemia    Osteoporosis    now has osteopenia   Thyroid disease    Past Surgical History:  Procedure Laterality Date   HAND SURGERY     40 yrs ago   Patient Active Problem List   Diagnosis Date Noted   LFTs abnormal 11/10/2021   Toe deformity 11/10/2021   Cystitis 07/31/2021   Stress incontinence 11/08/2020   Stress 11/08/2020   Skin exam, screening for cancer 11/08/2020   Erythematous rash 10/15/2018   Ganglion cyst of finger 09/24/2018   Eczema of scalp 04/03/2017   Hair loss 06/02/2014   Encounter for general adult medical examination with abnormal findings 11/24/2013   Hyperlipidemia 05/20/2013   Hypothyroidism 05/20/2013   Osteoporosis 05/20/2013   Thyroid nodule 05/20/2013   Carpal tunnel syndrome 02/10/2013   Elevated TSH 02/10/2013    PCP: Leone Haven, MD  REFERRING PROVIDER: Hollice Espy, MD  REFERRING DIAG: N39.3 (ICD-10-CM) - Stress incontinence, female   THERAPY DIAG:  Pelvic floor dysfunction  Muscle weakness (generalized)  Other lack of coordination  Rationale for Evaluation and Treatment: Rehabilitation  ONSET DATE: 1 year                                                                                                                                                                                           PRECAUTIONS: None  WEIGHT BEARING RESTRICTIONS: No  FALLS:  Has patient fallen  in last 6 months? 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 Pt also had a series of UTI's last year which is where she started taking cranberry and probiotic pills. Pt does hold her urine occasionally because she gets distracted or is busy at work. Some days are worse than others in terms of leakage.  PATIENT GOALS: Pt would like to not have urinary leakage and not have to wear incontinence pads    UROLOGICAL HISTORY Fluid intake: 2 cups coffee in morning, sparkling water, pure water, white wine at night (no more than 2)   Pain with urination: No Fully empty bladder: No Stream: Strong Urgency: No  Frequency: 6-7x  Just in case: Yes  Nocturia: 1-2x  Leakage: Coughing, Sneezing, Laughing, Exercise, Lifting, and Bending forward Pads: Yes Type: Lvl 3, Poise Amount: 2x/day, can be more if doing any of the above  Bladder control (0-10): 6/10    OBSTETRICAL HISTORY Vaginal deliveries: G2P1 Tearing: yes    GYNECOLOGICAL HISTORY Hysterectomy: no Pelvic Organ Prolapse: None Pain with exam: no Heaviness/pressure: no   SUBJECTIVE:  Pt has been doing well. Pt has had minimal urinary leakage and noticed it when holding her urine too long.    PAIN:  Are you having pain? No NPRS scale: 0/10     OBJECTIVE:    COGNITION: Overall cognitive status: Within functional limits for tasks assessed     POSTURE:   Iliac crest height: L iliac crest higher, L shoulder elevation Pelvic obliquity: L posteriorly rotated innominate     RANGE OF MOTION:    (Norm range in degrees)  LEFT 04/24/22 RIGHT 04/24/22  Lumbar forward flexion (65):  WNL    Lumbar extension (30): WNL    Lumbar lateral flexion (25):  WNL WNL  Thoracic and Lumbar rotation (30 degrees):    WNL WNL  Hip Flexion (0-125):   WNL WNL   Hip IR (0-45):  WNL WNL  Hip ER (0-45):  WNL WNL  Hip Adduction:      Hip Abduction (0-40):  WNL WNL  Hip extension (0-15):     (*= pain, Blank rows = not tested)   STRENGTH: MMT    RLE 04/24/22 LLE 04/24/22  Hip Flexion 5 4  Hip Extension    Hip Abduction     Hip Adduction     Hip ER  5 5  Hip IR  5 5  Knee Extension 5 5  Knee Flexion 5 4  Dorsiflexion     Plantarflexion (seated) 5 5  (*= pain, Blank rows = not tested)   SPECIAL TESTS:   FABER (SN 81): negative B FADIR (SN 94): negative B     PALPATION:  Abdominal:  Diastasis:  2 fingers above umbilicus,  1.5 fingers at umbilicus, and 1 finger below umbilicus, significant doming observed  -no tenderness upon palpation but some muscular tension noted in lower quadrants    EXTERNAL PELVIC EXAM: Patient educated on the purpose of the pelvic exam and articulated understanding; patient consented to the exam verbally.  Breath coordination: present but inconsistent Voluntary Contraction: 4/5 MMT Relaxation: full Perineal movement with sustained IAP increase ("bear down"): elevation then cued again and felt no change, Valsalva noted Perineal movement with rapid IAP increase ("cough"): no change (0= no contraction, 1= flicker, 2= weak squeeze, 3= fair squeeze with lift, 4= good squeeze and lift against resistance, 5= strong squeeze against strong resistance)    TODAY'S TREATMENT  Neuromuscular Re-education: Pre-treatment assessment -  Abdominal:  Diastasis: 1.5 fingers above umbilicus, 1.5 fingers at umbilicus, and .5 fingers below umbilicus, doming observed but not as significant as IE  Discussion on gluteal activation in standing and placing COG more anteriorly as opposed to posteriorly to decrease activation of gluteal musculature and B knee hyperextension. DPT advised Pt to practice during everyday activities (brushing teeth,  washing dishes, etc). Pt verbalized understanding   STSs with coordinated breath, x10,  for improved IAP management, VCs and TCs required  Discussed importance of STSs  Supine modified dead bug, with separate movement of UE/LE, x10 each for improved IAP management, VCs and TCs required    Patient response to interventions: Pt felt more TrA activation during LE movement during the modified dead bug   Patient Education:  Patient provided with HEP including: STSs, modified dead bug. Patient educated throughout session on appropriate technique and form using multi-modal cueing, HEP, and activity modification. Patient will benefit from further education in order to maximize compliance and understanding for long-term therapeutic gains.      ASSESSMENT:  Clinical Impression: Patient presents to clinic with excellent motivation to participate in today's session. Pt continues to demonstrate deficits in IAP management, PFM coordination, PFM endurance, pain, LE strength, and posture. Upon palpation of DRA, Pt demonstrates improvement with 1.5 fingers above umbilicus, 1.5 fingers at umbilicus, and .5 fingers below umbilicus compared to IE (2 fingers above umbilicus,  1.5 fingers at umbilicus, and 1 finger below umbilicus, significant doming). Pt continues to have doming but not as significant as IE. Discussion on gluteal activation and how that can affect PFM tension and fatigue. Pt required moderate VCs and TCs for increased challenge of TrA activation in supine to decrease bodily compensations (Valsalva). Pt improved with continued time. Patient responded positively to active and educational interventions. Patient will continue to benefit from skilled therapeutic intervention to address deficits in IAP management, PFM coordination, PFM endurance, pain, LE strength, and posture in order to increase PLOF and improve overall QOL.    Objective Impairments: decreased coordination, decreased endurance, decreased strength, improper body mechanics, and pain.   Activity Limitations: carrying,  lifting, bending, squatting, continence, and toileting  Personal Factors: Age, Behavior pattern, Past/current experiences, Time since onset of injury/illness/exacerbation, and 1-2 comorbidities: osteoporosis, hypothyroidism  are also affecting patient's functional outcome.   Rehab Potential: Good  Clinical Decision Making: Evolving/moderate complexity  Evaluation Complexity: Moderate   GOALS: Goals reviewed with patient? Yes  SHORT TERM GOALS: Target date: 05/22/2022  Patient will demonstrate independence with HEP in order to maximize therapeutic gains and improve carryover from physical therapy sessions to ADLs in the home and community. Baseline: bladder irritants handout Goal status: INITIAL   LONG TERM GOALS: Target date: 06/26/2022   Patient will score >/= 63 on FOTO Urinary Problem  in order to demonstrate improved IAP management, improved PFM coordination/strength, and overall QOL.  Baseline: 56 Goal status: INITIAL  2.  Patient will report less than 5 incidents of stress urinary incontinence over the course of 3 weeks while coughing/sneezing/laughing/physical activity/lifting/bending in order to demonstrate improved PFM coordination, strength, and function for improved overall QOL. Baseline: urinary leakage with all the above  Goal status: INITIAL  3.  Patient will report decreased reliance on protective undergarments as indicated by a 24 hour period to demonstrate improved bladder control and allow for increased participation in activities outside of the home. Baseline: 2x/day or more, Lvl 3 Poise   Goal status: INITIAL  4.  Patient will demonstrate circumferential and sequential contraction of >3/5 MMT, > 5 sec hold x5 and 5 consecutive quick flicks with </= 10 min rest between testing bouts, and relaxation of the PFM coordinated with breath for improved management of intra-abdominal pressure and normal bowel and bladder function without the presence of pain nor  incontinence in order to improve participation at home and in the  community. Baseline: 4/5 MMT, no quick flicks tested  Goal status: INITIAL  5.  Patient will demonstrate coordinated lengthening and relaxation of PFM with diaphragmatic inhalation in order to decrease spasm and allow for unrestricted elimination of urine/feces for improved overall QOL. Baseline: present but inconsistent  Goal status: INITIAL    PLAN: PT Frequency: 1x/week  PT Duration: 10 weeks  Planned Interventions: Therapeutic exercises, Therapeutic activity, Neuromuscular re-education, Balance training, Gait training, Patient/Family education, Self Care, Joint mobilization, Cryotherapy, Moist heat, scar mobilization, Taping, and Manual therapy  Plan For Next Session:  check hip, "the knack", bridging with one leg, single leg squat, SLS, Pallof press, 3-way hip  Kieren Adkison, PT, DPT  06/19/2022, 8:47 AM

## 2022-06-28 ENCOUNTER — Ambulatory Visit: Payer: PPO | Attending: Urology

## 2022-06-28 DIAGNOSIS — M6289 Other specified disorders of muscle: Secondary | ICD-10-CM | POA: Diagnosis not present

## 2022-06-28 DIAGNOSIS — R278 Other lack of coordination: Secondary | ICD-10-CM | POA: Diagnosis not present

## 2022-06-28 DIAGNOSIS — M6281 Muscle weakness (generalized): Secondary | ICD-10-CM | POA: Insufficient documentation

## 2022-06-28 NOTE — Therapy (Signed)
OUTPATIENT PHYSICAL THERAPY FEMALE PELVIC RE-CERT   Patient Name: Shannon [name] MRN: [id number] DOB:[birth year], 71 y.o., female Today's Date: 06/28/2022   PT End of Session - 06/28/22 0848     Visit Number 9    Number of Visits 20    Date for PT Re-Evaluation 09/13/22    Authorization Type IE: 04/17/22; RC/PN: 06/28/22    PT Start Time 0845    PT Stop Time 0925    PT Time Calculation (min) 40 min    Activity Tolerance Patient tolerated treatment well             Past Medical History:  Diagnosis Date   GERD (gastroesophageal reflux disease)    Hyperlipidemia    Osteoporosis    now has osteopenia   Thyroid disease    Past Surgical History:  Procedure Laterality Date   HAND SURGERY     40 yrs ago   Patient Active Problem List   Diagnosis Date Noted   LFTs abnormal 11/10/2021   Toe deformity 11/10/2021   Cystitis 07/31/2021   Stress incontinence 11/08/2020   Stress 11/08/2020   Skin exam, screening for cancer 11/08/2020   Erythematous rash 10/15/2018   Ganglion cyst of finger 09/24/2018   Eczema of scalp 04/03/2017   Hair loss 06/02/2014   Encounter for general adult medical examination with abnormal findings 11/24/2013   Hyperlipidemia 05/20/2013   Hypothyroidism 05/20/2013   Osteoporosis 05/20/2013   Thyroid nodule 05/20/2013   Carpal tunnel syndrome 02/10/2013   Elevated TSH 02/10/2013    PCP: Leone Haven, MD  REFERRING PROVIDER: Hollice Espy, MD  REFERRING DIAG: N39.3 (ICD-10-CM) - Stress incontinence, female   THERAPY DIAG:  Pelvic floor dysfunction  Muscle weakness (generalized)  Other lack of coordination  Rationale for Evaluation and Treatment: Rehabilitation  ONSET DATE: 1 year                                                                                                                                                                                           PRECAUTIONS: None  WEIGHT BEARING RESTRICTIONS: No  FALLS:  Has  patient fallen in last 6 months? 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 Pt also had a series of UTI's last year which is where she started taking cranberry and probiotic pills. Pt does hold her urine occasionally because she gets distracted or is busy at work. Some days are worse than others in terms of leakage.  PATIENT GOALS: Pt would like to not have urinary leakage and not have to wear incontinence pads    UROLOGICAL HISTORY Fluid intake: 2 cups coffee in morning, sparkling water, pure water, white wine at night (no more than 2)   Pain with urination: No Fully empty bladder: No Stream: Strong Urgency: No  Frequency: 6-7x  Just in case: Yes  Nocturia: 1-2x  Leakage: Coughing, Sneezing, Laughing, Exercise, Lifting, and Bending forward Pads: Yes Type: Lvl 3, Poise Amount: 2x/day, can be more if doing any of the above  Bladder control (0-10): 6/10    OBSTETRICAL HISTORY Vaginal deliveries: G2P1 Tearing: yes    GYNECOLOGICAL HISTORY Hysterectomy: no Pelvic Organ Prolapse: None Pain with exam: no Heaviness/pressure: no   SUBJECTIVE:  Pt has been doing well. Pt was able to trial at home no use of incontinence pad (pantyliner) while performing household chores and cooking. Pt had no urinary leakage.    PAIN:  Are you having pain? No NPRS scale: 0/10     OBJECTIVE:    COGNITION: Overall cognitive status: Within functional limits for tasks assessed     POSTURE:   Iliac crest height: L iliac crest higher, L shoulder elevation Pelvic obliquity: L posteriorly rotated innominate     RANGE OF MOTION:    (Norm range in degrees)  LEFT 04/24/22 RIGHT 04/24/22  Lumbar forward flexion (65):  WNL    Lumbar extension (30): WNL    Lumbar lateral flexion (25):  WNL WNL  Thoracic  and Lumbar rotation (30 degrees):    WNL WNL  Hip Flexion (0-125):   WNL WNL  Hip IR (0-45):  WNL WNL  Hip ER (0-45):  WNL WNL  Hip Adduction:      Hip Abduction (0-40):  WNL WNL  Hip extension (0-15):     (*= pain, Blank rows = not tested)   STRENGTH: MMT    RLE 04/24/22 LLE 04/24/22  Hip Flexion 5 4  Hip Extension    Hip Abduction     Hip Adduction     Hip ER  5 5  Hip IR  5 5  Knee Extension 5 5  Knee Flexion 5 4  Dorsiflexion     Plantarflexion (seated) 5 5  (*= pain, Blank rows = not tested)   SPECIAL TESTS:   FABER (SN 81): negative B FADIR (SN 94): negative B     PALPATION:  Abdominal:  Diastasis:  2 fingers above umbilicus,  1.5 fingers at umbilicus, and 1 finger below umbilicus, significant doming observed  -no tenderness upon palpation but some muscular tension noted in lower quadrants    EXTERNAL PELVIC EXAM: Patient educated on the purpose of the pelvic exam and articulated understanding; patient consented to the exam verbally.  Breath coordination: present but inconsistent Voluntary Contraction: 4/5 MMT Relaxation: full Perineal movement with sustained IAP increase ("bear down"): elevation then cued again and felt no change, Valsalva noted Perineal movement with rapid IAP increase ("cough"): no change (0= no contraction, 1= flicker, 2= weak squeeze, 3= fair squeeze with lift, 4= good squeeze and lift against resistance, 5= strong squeeze against strong resistance)    TODAY'S TREATMENT  Neuromuscular Re-education: Pre-treatment assessment -  Abdominal:  Diastasis: 1.5 fingers above umbilicus, 1.5 fingers at umbilicus, and .5 fingers below umbilicus, doming observed but not as significant as IE  POSTURE:   Iliac crest height: B equal iliac crests, (IE: L iliac crest higher, L shoulder elevation) Pelvic obliquity: equal innominates (IE: L posteriorly rotated  innominate)   EXTERNAL PELVIC EXAM: Patient educated on the purpose of the pelvic exam  and articulated understanding; patient consented to the exam verbally.  Breath coordination: present fully (IE: present but inconsistent) Voluntary Contraction: 4/5 MMT Relaxation: full Perineal movement with sustained IAP increase ("bear down"): descent (IE: elevation then cued again and felt no change, Valsalva noted) Perineal movement with rapid IAP increase ("cough"): no change (0= no contraction, 1= flicker, 2= weak squeeze, 3= fair squeeze with lift, 4= good squeeze and lift against resistance, 5= strong squeeze against strong resistance)    Reassessment of FOTO Urinary Problem  FOTO - IE:56, Today: 59 (3 point change) Discussion on improvement and continuing with PFPT   Discussion and review of goals: 2/6 goals met    Patient response to interventions: Pt would like to continue with PFPT next year and is content with progress and avoiding surgical interventions   Patient Education:  Patient provided with HEP including: no change. Patient educated throughout session on appropriate technique and form using multi-modal cueing, HEP, and activity modification. Patient will benefit from further education in order to maximize compliance and understanding for long-term therapeutic gains.      ASSESSMENT:  Clinical Impression: Patient presents to clinic with excellent motivation to participate in today's re-certification. Pt has made moderate progress since IE on 04/17/22 in relation to IAP management, PFM coordination, pain, and posture. Pt's FOTO Urinary Problem score has improved by 3 points (56 to 59). Initially, Pt with increased L iliac crest/L shoulder elevation and L posteriorly rotated innominate. Pt is now equal bilaterally with decreased low back pain and only visiting chiropractor for maintenance 1x/month. In relation to PFM, Pt continues to use Lvl 3 incontinence pads but only changing 1x/day and has noticed decreased amount of urine leakage. Pt also continues to have SUI with  coughing/sneezing/laughing/physical activity but reports only "dribbling" amounts compared to increased amounts of urine on IE. Upon PFM external assessment, Pt has fully present breath coordination (IE: present but inconsistent) and is able to demonstrate descent of PFM with sustained IAP increase "bear down" (IE: elevation). In relation to IAP management, Pt's DRA has improved to 1.5 fingers above umbilicus, 1.5 fingers at umbilicus, and .5 fingers below umbilicus compared to IE (2 fingers above umbilicus,  1.5 fingers at umbilicus, and 1 finger below umbilicus, significant doming), but ultimately will benefit from continued intervention to allow for 1 finger or less throughout. Pt has met 2/6 goals created from IE but will continue to benefit from skilled therapeutic intervention to address remaining deficits in IAP management, PFM coordination, PFM strength/endurance, and LE strength, in order to increase PLOF and improve overall QOL.    Objective Impairments: decreased coordination, decreased endurance, decreased strength, improper body mechanics, and pain.   Activity Limitations: carrying, lifting, bending, squatting, continence, and toileting  Personal Factors: Age, Behavior pattern, Past/current experiences, Time since onset of injury/illness/exacerbation, and 1-2 comorbidities: osteoporosis, hypothyroidism  are also affecting patient's functional outcome.   Rehab Potential: Good  Clinical Decision Making: Evolving/moderate complexity  Evaluation Complexity: Moderate   GOALS: Goals reviewed with patient? Yes  SHORT TERM GOALS: Target date: 05/22/2022  Patient will demonstrate independence with HEP in order to maximize therapeutic gains and improve carryover from physical therapy sessions to ADLs in the home and community. Baseline: bladder irritants handout; (12/7): IND with HEP Goal status: MET   LONG TERM GOALS: Target date: 09/13/2022   Patient will score >/= 63 on FOTO Urinary  Problem  in order  to demonstrate improved IAP management, improved PFM coordination/strength, and overall QOL.  Baseline: 56; (12/7): 59 Goal status: IN PROGRESS  2.  Patient will report less than 5 incidents of stress urinary incontinence over the course of 3 weeks while coughing/sneezing/laughing/physical activity/lifting/bending in order to demonstrate improved PFM coordination, strength, and function for improved overall QOL. Baseline: urinary leakage with all the above; (12/7): continues to have urinary leakage with sneezing/walking longer periods of time/coughing but significantly less urinary leakage "dribbling" Goal status: IN PROGRESS  3.  Patient will report decreased reliance on protective undergarments as indicated by a 24 hour period to demonstrate improved bladder control and allow for increased participation in activities outside of the home. Baseline: 2x/day or more, Lvl 3 Poise ; (12/7): 1x/day, Lvl 3  Goal status: IN PROGRESS   4.  Patient will demonstrate circumferential and sequential contraction of >3/5 MMT, > 5 sec hold x5 and 5 consecutive quick flicks with </= 10 min rest between testing bouts, and relaxation of the PFM coordinated with breath for improved management of intra-abdominal pressure and normal bowel and bladder function without the presence of pain nor incontinence in order to improve participation at home and in the community. Baseline: 4/5 MMT, no quick flicks tested Goal status: IN PROGRESS  5.  Patient will demonstrate coordinated lengthening and relaxation of PFM with diaphragmatic inhalation in order to decrease spasm and allow for unrestricted elimination of urine/feces for improved overall QOL. Baseline: present but inconsistent; (12/7): present completely  Goal status: MET    PLAN: PT Frequency: 1x/week  PT Duration: 10 weeks  Planned Interventions: Therapeutic exercises, Therapeutic activity, Neuromuscular re-education, Balance training, Gait  training, Patient/Family education, Self Care, Joint mobilization, Cryotherapy, Moist heat, scar mobilization, Taping, and Manual therapy  Plan For Next Session:   "the knack", bridging with one leg, single leg squat, SLS, Pallof press, 3-way hip  Zarayah Lanting, PT, DPT  06/28/2022, 10:14 AM

## 2022-07-04 ENCOUNTER — Other Ambulatory Visit: Payer: Self-pay | Admitting: Family Medicine

## 2022-07-05 ENCOUNTER — Ambulatory Visit: Payer: PPO

## 2022-07-09 DIAGNOSIS — M5412 Radiculopathy, cervical region: Secondary | ICD-10-CM | POA: Diagnosis not present

## 2022-07-09 DIAGNOSIS — M5033 Other cervical disc degeneration, cervicothoracic region: Secondary | ICD-10-CM | POA: Diagnosis not present

## 2022-07-09 DIAGNOSIS — M9902 Segmental and somatic dysfunction of thoracic region: Secondary | ICD-10-CM | POA: Diagnosis not present

## 2022-07-09 DIAGNOSIS — M9901 Segmental and somatic dysfunction of cervical region: Secondary | ICD-10-CM | POA: Diagnosis not present

## 2022-07-11 ENCOUNTER — Telehealth: Payer: PPO | Admitting: Physician Assistant

## 2022-07-11 DIAGNOSIS — R3989 Other symptoms and signs involving the genitourinary system: Secondary | ICD-10-CM

## 2022-07-11 MED ORDER — CEPHALEXIN 500 MG PO CAPS: 500.0000 mg | ORAL_CAPSULE | Freq: Two times a day (BID) | ORAL | 0 refills | Status: AC

## 2022-07-11 NOTE — Progress Notes (Signed)
E-Visit for Urinary Problems  We are sorry that you are not feeling well.  Here is how we plan to help!  Based on what you shared with me it looks like you most likely have a simple urinary tract infection.  A UTI (Urinary Tract Infection) is a bacterial infection of the bladder.  Most cases of urinary tract infections are simple to treat but a key part of your care is to encourage you to drink plenty of fluids and watch your symptoms carefully.  I have prescribed Keflex 500 mg twice a day for 7 days.  Your symptoms should gradually improve. Call your PCP or be evaluated in person at Urgent Care  if the burning in your urine worsens, you develop worsening fever, back pain or pelvic pain or if your symptoms do not resolve after completing the antibiotic.  Urinary tract infections can be prevented by drinking plenty of water to keep your body hydrated.  Also be sure when you wipe, wipe from front to back and don't hold it in!  If possible, empty your bladder every 4 hours.  HOME CARE Drink plenty of fluids Compete the full course of the antibiotics even if the symptoms resolve Remember, when you need to go.go. Holding in your urine can increase the likelihood of getting a UTI! GET HELP RIGHT AWAY IF: You cannot urinate You get a high fever Worsening back pain occurs You see blood in your urine You feel sick to your stomach or throw up You feel like you are going to pass out  MAKE SURE YOU  Understand these instructions. Will watch your condition. Will get help right away if you are not doing well or get worse.   Thank you for choosing an e-visit.  Your e-visit answers were reviewed by a board certified advanced clinical practitioner to complete your personal care plan. Depending upon the condition, your plan could have included both over the counter or prescription medications.  Please review your pharmacy choice. Make sure the pharmacy is open so you can pick up prescription now.  If there is a problem, you may contact your provider through [employer] and have the prescription routed to another pharmacy.  Your safety is important to [place]. If you have drug allergies check your prescription carefully.   For the next 24 hours you can use MyChart to ask questions about today's visit, request a non-urgent call back, or ask for a work or school excuse. You will get an email in the next two days asking about your experience. I hope that your e-visit has been valuable and will speed your recovery.

## 2022-07-11 NOTE — Progress Notes (Signed)
I have spent 5 minutes in review of e-visit questionnaire, review and updating patient chart, medical decision making and response to patient.   Ellie Spickler Cody Lawarence Meek, PA-C    

## 2022-07-12 ENCOUNTER — Ambulatory Visit: Payer: PPO

## 2022-07-12 DIAGNOSIS — M6289 Other specified disorders of muscle: Secondary | ICD-10-CM

## 2022-07-12 DIAGNOSIS — R278 Other lack of coordination: Secondary | ICD-10-CM

## 2022-07-12 DIAGNOSIS — M6281 Muscle weakness (generalized): Secondary | ICD-10-CM

## 2022-07-12 NOTE — Therapy (Signed)
OUTPATIENT PHYSICAL THERAPY FEMALE PELVIC PROGRESS NOTE  FROM REPORTING PERIOD 04/17/22 - 07/12/22   Patient Name: Shannon [name] MRN: [id number] DOB:July 22, 1951, 71 y.o., female Today's Date: 07/12/2022   PT End of Session - 07/12/22 0844     Visit Number 10    Number of Visits 20    Date for PT Re-Evaluation 09/13/22    Authorization Type IE: 04/17/22; RC/PN: 06/28/22; PN: 12/21    PT Start Time 0845    PT Stop Time 0925    PT Time Calculation (min) 40 min    Activity Tolerance Patient tolerated treatment well             Past Medical History:  Diagnosis Date   GERD (gastroesophageal reflux disease)    Hyperlipidemia    Osteoporosis    now has osteopenia   Thyroid disease    Past Surgical History:  Procedure Laterality Date   HAND SURGERY     40 yrs ago   Patient Active Problem List   Diagnosis Date Noted   LFTs abnormal 11/10/2021   Toe deformity 11/10/2021   Cystitis 07/31/2021   Stress incontinence 11/08/2020   Stress 11/08/2020   Skin exam, screening for cancer 11/08/2020   Erythematous rash 10/15/2018   Ganglion cyst of finger 09/24/2018   Eczema of scalp 04/03/2017   Hair loss 06/02/2014   Encounter for general adult medical examination with abnormal findings 11/24/2013   Hyperlipidemia 05/20/2013   Hypothyroidism 05/20/2013   Osteoporosis 05/20/2013   Thyroid nodule 05/20/2013   Carpal tunnel syndrome 02/10/2013   Elevated TSH 02/10/2013    PCP: Leone Haven, MD  REFERRING PROVIDER: Hollice Espy, MD  REFERRING DIAG: N39.3 (ICD-10-CM) - Stress incontinence, female   THERAPY DIAG:  Pelvic floor dysfunction  Muscle weakness (generalized)  Other lack of coordination  Rationale for Evaluation and Treatment: Rehabilitation  ONSET DATE: 1 year                                                                                                                                                                                            PRECAUTIONS: None  WEIGHT BEARING RESTRICTIONS: No  FALLS:  Has patient fallen in last 6 months? 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 Pt also had a series of UTI's last year which is where she started taking cranberry and probiotic pills. Pt does hold her urine occasionally because she gets distracted or is busy at work. Some  days are worse than others in terms of leakage.     PATIENT GOALS: Pt would like to not have urinary leakage and not have to wear incontinence pads    UROLOGICAL HISTORY Fluid intake: 2 cups coffee in morning, sparkling water, pure water, white wine at night (no more than 2)   Pain with urination: No Fully empty bladder: No Stream: Strong Urgency: No  Frequency: 6-7x  Just in case: Yes  Nocturia: 1-2x  Leakage: Coughing, Sneezing, Laughing, Exercise, Lifting, and Bending forward Pads: Yes Type: Lvl 3, Poise Amount: 2x/day, can be more if doing any of the above  Bladder control (0-10): 6/10    OBSTETRICAL HISTORY Vaginal deliveries: G2P1 Tearing: yes    GYNECOLOGICAL HISTORY Hysterectomy: no Pelvic Organ Prolapse: None Pain with exam: no Heaviness/pressure: no   SUBJECTIVE:  Pt has had a little cough and has noticed more urinary leakage with that. Pt has had to wear something a little heavier than a pantyliner. Pt notices very minimal urinary leakage when she is not coughing.    PAIN:  Are you having pain? No NPRS scale: 0/10     OBJECTIVE:    COGNITION: Overall cognitive status: Within functional limits for tasks assessed     POSTURE:   Iliac crest height: L iliac crest higher, L shoulder elevation Pelvic obliquity: L posteriorly rotated innominate     RANGE OF MOTION:    (Norm range in degrees)  LEFT 04/24/22 RIGHT 04/24/22  Lumbar  forward flexion (65):  WNL    Lumbar extension (30): WNL    Lumbar lateral flexion (25):  WNL WNL  Thoracic and Lumbar rotation (30 degrees):    WNL WNL  Hip Flexion (0-125):   WNL WNL  Hip IR (0-45):  WNL WNL  Hip ER (0-45):  WNL WNL  Hip Adduction:      Hip Abduction (0-40):  WNL WNL  Hip extension (0-15):     (*= pain, Blank rows = not tested)   STRENGTH: MMT    RLE 04/24/22 LLE 04/24/22  Hip Flexion 5 4  Hip Extension    Hip Abduction     Hip Adduction     Hip ER  5 5  Hip IR  5 5  Knee Extension 5 5  Knee Flexion 5 4  Dorsiflexion     Plantarflexion (seated) 5 5  (*= pain, Blank rows = not tested)   SPECIAL TESTS:   FABER (SN 81): negative B FADIR (SN 94): negative B     PALPATION:  Abdominal:  Diastasis:  2 fingers above umbilicus,  1.5 fingers at umbilicus, and 1 finger below umbilicus, significant doming observed  -no tenderness upon palpation but some muscular tension noted in lower quadrants    EXTERNAL PELVIC EXAM: Patient educated on the purpose of the pelvic exam and articulated understanding; patient consented to the exam verbally.  Breath coordination: present but inconsistent Voluntary Contraction: 4/5 MMT Relaxation: full Perineal movement with sustained IAP increase ("bear down"): elevation then cued again and felt no change, Valsalva noted Perineal movement with rapid IAP increase ("cough"): no change (0= no contraction, 1= flicker, 2= weak squeeze, 3= fair squeeze with lift, 4= good squeeze and lift against resistance, 5= strong squeeze against strong resistance)    TODAY'S TREATMENT  Neuromuscular Re-education: Discussion on how IAP, such as coughing, can lead to increased urinary leakage. Pt verbalized understanding.   Discussion on main symptoms of a UTI as Pt had a recent e-visit for suspected UTI.  However, after discussion Pt believes she held going to the bathroom too long.   Patient educated on pressure management strategy ("the  knack") with movements against gravity for improved IAP management and decreased urinary incontinence. Patient demonstrated, x3, and verbalized understanding.   Therapeutic Exercise: Hip 3-way (flex/abd/ext) on plinth and then practiced in standing Pt given RTB to practice as part of HEP placing around ankles   Patient response to interventions: Pt is comfortable to progress to every other week   Patient Education:  Patient provided with HEP including: hip 3-way. Patient educated throughout session on appropriate technique and form using multi-modal cueing, HEP, and activity modification. Patient will benefit from further education in order to maximize compliance and understanding for long-term therapeutic gains.      ASSESSMENT:  Clinical Impression: Patient presents to clinic with excellent motivation to participate in today's progress note. Pt has made moderate progress since IE on 04/17/22 in relation to IAP management, PFM coordination, pain, and posture. Pt's FOTO Urinary Problem score has improved by 3 points (56 to 59). Initially, Pt with increased L iliac crest/L shoulder elevation and L posteriorly rotated innominate. Pt is now equal bilaterally with decreased low back pain and only visiting chiropractor for maintenance 1x/month. In relation to PFM, Pt continues to use Lvl 3 incontinence pads but only changing 1x/day and has noticed decreased amount of urine leakage. Pt also continues to have SUI with coughing/sneezing but reports only "dribbling" amounts compared to increased amounts of urine on IE. Upon PFM external assessment, Pt has fully present breath coordination (IE: present but inconsistent) and is able to demonstrate descent of PFM with sustained IAP increase "bear down" (IE: elevation). In relation to IAP management, Pt's DRA has improved to 1.5 fingers above umbilicus, 1.5 fingers at umbilicus, and .5 fingers below umbilicus compared to IE (2 fingers above umbilicus,  1.5  fingers at umbilicus, and 1 finger below umbilicus, significant doming), but ultimately will benefit from continued intervention to allow for 1 finger or less throughout. Pt educated on "the knack" technique to aid in urinary leakage with IAP and required moderate VCs and TCs with active interventions to decrease bodily compensations (pelvic rotation). With increased time, Pt able to demonstrate understanding. Pt has met 2/6 goals created from IE but will continue to benefit from skilled therapeutic intervention every other week to address remaining deficits in IAP management, PFM coordination, PFM strength/endurance, and LE strength, in order to increase PLOF and improve overall QOL.    Objective Impairments: decreased coordination, decreased endurance, decreased strength, improper body mechanics, and pain.   Activity Limitations: carrying, lifting, bending, squatting, continence, and toileting  Personal Factors: Age, Behavior pattern, Past/current experiences, Time since onset of injury/illness/exacerbation, and 1-2 comorbidities: osteoporosis, hypothyroidism  are also affecting patient's functional outcome.   Rehab Potential: Good  Clinical Decision Making: Evolving/moderate complexity  Evaluation Complexity: Moderate   GOALS: Goals reviewed with patient? Yes  SHORT TERM GOALS: Target date: 05/22/2022  Patient will demonstrate independence with HEP in order to maximize therapeutic gains and improve carryover from physical therapy sessions to ADLs in the home and community. Baseline: bladder irritants handout; (12/7): IND with HEP Goal status: MET   LONG TERM GOALS: Target date: 09/13/2022   Patient will score >/= 63 on FOTO Urinary Problem  in order to demonstrate improved IAP management, improved PFM coordination/strength, and overall QOL.  Baseline: 56; (12/7): 59 Goal status: IN PROGRESS  2.  Patient will report less than 5 incidents of stress urinary  incontinence over the  course of 3 weeks while coughing/sneezing/laughing/physical activity/lifting/bending in order to demonstrate improved PFM coordination, strength, and function for improved overall QOL. Baseline: urinary leakage with all the above; (12/7): continues to have urinary leakage with sneezing/walking longer periods of time/coughing but significantly less urinary leakage "dribbling" Goal status: IN PROGRESS  3.  Patient will report decreased reliance on protective undergarments as indicated by a 24 hour period to demonstrate improved bladder control and allow for increased participation in activities outside of the home. Baseline: 2x/day or more, Lvl 3 Poise ; (12/7): 1x/day, Lvl 3  Goal status: IN PROGRESS   4.  Patient will demonstrate circumferential and sequential contraction of >3/5 MMT, > 5 sec hold x5 and 5 consecutive quick flicks with </= 10 min rest between testing bouts, and relaxation of the PFM coordinated with breath for improved management of intra-abdominal pressure and normal bowel and bladder function without the presence of pain nor incontinence in order to improve participation at home and in the community. Baseline: 4/5 MMT, no quick flicks tested Goal status: IN PROGRESS  5.  Patient will demonstrate coordinated lengthening and relaxation of PFM with diaphragmatic inhalation in order to decrease spasm and allow for unrestricted elimination of urine/feces for improved overall QOL. Baseline: present but inconsistent; (12/7): present completely  Goal status: MET    PLAN: PT Frequency: 1x/week  PT Duration: 10 weeks  Planned Interventions: Therapeutic exercises, Therapeutic activity, Neuromuscular re-education, Balance training, Gait training, Patient/Family education, Self Care, Joint mobilization, Cryotherapy, Moist heat, scar mobilization, Taping, and Manual therapy  Plan For Next Session:  getting back to walking, continue with IAP management, bridging with one leg, single leg  squat, SLS, Pallof press   Vaanya Shambaugh, PT, DPT  07/12/2022, 10:35 AM

## 2022-07-19 ENCOUNTER — Ambulatory Visit: Payer: PPO

## 2022-07-21 ENCOUNTER — Encounter: Payer: Self-pay | Admitting: Family Medicine

## 2022-07-24 ENCOUNTER — Ambulatory Visit: Payer: PPO

## 2022-07-24 ENCOUNTER — Other Ambulatory Visit: Payer: Self-pay

## 2022-07-24 DIAGNOSIS — E785 Hyperlipidemia, unspecified: Secondary | ICD-10-CM

## 2022-07-24 MED ORDER — SIMVASTATIN 20 MG PO TABS: 20.0000 mg | ORAL_TABLET | Freq: Every evening | ORAL | 0 refills | Status: DC

## 2022-07-26 ENCOUNTER — Ambulatory Visit: Payer: PPO

## 2022-08-02 ENCOUNTER — Ambulatory Visit: Payer: PPO

## 2022-08-07 ENCOUNTER — Ambulatory Visit: Payer: PPO

## 2022-08-08 DIAGNOSIS — M9902 Segmental and somatic dysfunction of thoracic region: Secondary | ICD-10-CM | POA: Diagnosis not present

## 2022-08-08 DIAGNOSIS — M5412 Radiculopathy, cervical region: Secondary | ICD-10-CM | POA: Diagnosis not present

## 2022-08-08 DIAGNOSIS — M9901 Segmental and somatic dysfunction of cervical region: Secondary | ICD-10-CM | POA: Diagnosis not present

## 2022-08-08 DIAGNOSIS — M5033 Other cervical disc degeneration, cervicothoracic region: Secondary | ICD-10-CM | POA: Diagnosis not present

## 2022-08-09 ENCOUNTER — Ambulatory Visit: Payer: PPO

## 2022-08-15 ENCOUNTER — Ambulatory Visit: Payer: PPO | Attending: Urology

## 2022-08-15 ENCOUNTER — Other Ambulatory Visit: Payer: Self-pay

## 2022-08-15 ENCOUNTER — Ambulatory Visit
Admission: EM | Admit: 2022-08-15 | Discharge: 2022-08-15 | Disposition: A | Discharge status: Home / Self Care | Payer: PPO | Attending: Urgent Care | Admitting: Urgent Care

## 2022-08-15 DIAGNOSIS — M6281 Muscle weakness (generalized): Secondary | ICD-10-CM | POA: Diagnosis not present

## 2022-08-15 DIAGNOSIS — M6289 Other specified disorders of muscle: Secondary | ICD-10-CM | POA: Diagnosis not present

## 2022-08-15 DIAGNOSIS — R278 Other lack of coordination: Secondary | ICD-10-CM | POA: Insufficient documentation

## 2022-08-15 DIAGNOSIS — N3001 Acute cystitis with hematuria: Secondary | ICD-10-CM | POA: Diagnosis not present

## 2022-08-15 LAB — URINALYSIS, W/ REFLEX TO CULTURE (INFECTION SUSPECTED)
Bilirubin Urine: NEGATIVE
Glucose, UA: NEGATIVE mg/dL
Ketones, ur: NEGATIVE mg/dL
Nitrite: POSITIVE — AB
Protein, ur: 30 mg/dL — AB
RBC / HPF: >50 RBC/hpf — ABNORMAL HIGH (ref 0–5)
Specific Gravity, Urine: 1.016 (ref 1.005–1.030)
WBC, UA: >50 WBC/hpf — ABNORMAL HIGH (ref 0–5)
pH: 5 (ref 5.0–8.0)

## 2022-08-15 LAB — POCT URINALYSIS DIP (MANUAL ENTRY)
Bilirubin, UA: NEGATIVE
Glucose, UA: 100 mg/dL — AB
Nitrite, UA: POSITIVE — AB
Protein Ur, POC: 100 mg/dL — AB
Spec Grav, UA: 1.015 (ref 1.010–1.025)
Urobilinogen, UA: 2 E.U./dL — AB
pH, UA: 5 (ref 5.0–8.0)

## 2022-08-15 MED ORDER — NITROFURANTOIN MONOHYD MACRO 100 MG PO CAPS: 100.0000 mg | ORAL_CAPSULE | Freq: Two times a day (BID) | ORAL | 0 refills | Status: DC

## 2022-08-15 NOTE — ED Provider Notes (Signed)
Shannon [name]    CSN: [id number] Arrival date & time: 08/15/22  1746      History   Chief Complaint No chief complaint on file.   HPI Shannon [name] is a 72 y.o. female.   HPI  Presents to urgent care with concern for UTI.  Symptoms since this morning.  She endorses pain with urination and frequency.  Endorses recurrent symptoms.  Past Medical History:  Diagnosis Date   GERD (gastroesophageal reflux disease)    Hyperlipidemia    Osteoporosis    now has osteopenia   Thyroid disease     Patient Active Problem List   Diagnosis Date Noted   LFTs abnormal 11/10/2021   Toe deformity 11/10/2021   Cystitis 07/31/2021   Stress incontinence 11/08/2020   Stress 11/08/2020   Skin exam, screening for cancer 11/08/2020   Erythematous rash 10/15/2018   Ganglion cyst of finger 09/24/2018   Eczema of scalp 04/03/2017   Hair loss 06/02/2014   Encounter for general adult medical examination with abnormal findings 11/24/2013   Hyperlipidemia 05/20/2013   Hypothyroidism 05/20/2013   Osteoporosis 05/20/2013   Thyroid nodule 05/20/2013   Carpal tunnel syndrome 02/10/2013   Elevated TSH 02/10/2013    Past Surgical History:  Procedure Laterality Date   HAND SURGERY     40 yrs ago    OB History   No obstetric history on file.      Home Medications    Prior to Admission medications   Medication Sig Start Date End Date Taking? Authorizing Provider  b complex vitamins capsule Take by mouth.    [provider]  Calcium Carb-Cholecalciferol 600-10 MG-MCG TABS     [provider]  Cholecalciferol (D3 HIGH POTENCY) 125 MCG (5000 UT) capsule     [provider]  Cranberry-Vitamin C-Vitamin E (CRANBERRY PLUS VITAMIN C) 4200-20-3 MG-MG-UNIT CAPS     [provider]  estradiol (ESTRACE) 0.1 MG/GM vaginal cream 1 gram intravaginally at bedtime daily for 2-3 weeks, then decrease to 3 times per week 08/04/21   Hollice Espy, MD   levothyroxine (SYNTHROID) 88 MCG tablet TAKE ONE TABLET BY MOUTH DAILY 12/25/21   Philemon Kingdom, MD  Multiple Vitamins-Minerals (CENTRUM SILVER PO) Take 1 tablet by mouth daily.    [provider]  Probiotic Product (PROBIOTIC-10 ULTIMATE PO) Take by mouth.    [provider]  simvastatin (ZOCOR) 20 MG tablet Take 1 tablet (20 mg total) by mouth every evening. 07/24/22   Leone Haven, MD    Family History Family History  Problem Relation Age of Onset   Breast cancer Mother 38   Heart disease Father    Cancer Father        Prostate   Diabetes Father        diagnosed later in life   Prostate cancer Father    Colon polyps Father 57   Parkinson's disease Father    Congestive Heart Failure Father    Asthma Brother    Colon cancer Neg Hx    Rectal cancer Neg Hx    Stomach cancer Neg Hx    Esophageal cancer Neg Hx     Social History Social History   Tobacco Use   Smoking status: Former    Types: Cigarettes    Quit date: 01/18/1989    Years since quitting: 33.5   Smokeless tobacco: Never  Vaping Use   Vaping Use: Never used  Substance Use Topics   Alcohol use: Yes  Alcohol/week: 7.0 standard drinks of alcohol    Types: 7 Glasses of wine per week   Drug use: No     Allergies   Patient has no known allergies.   Review of Systems Review of Systems   Physical Exam Triage Vital Signs ED Triage Vitals  Enc Vitals Group     BP      Pulse      Resp      Temp      Temp src      SpO2      Weight      Height      Head Circumference      Peak Flow      Pain Score      Pain Loc      Pain Edu?      Excl. in [place]?    No data found.  Updated Vital Signs There were no vitals taken for this visit.  Visual Acuity Right Eye Distance:   Left Eye Distance:   Bilateral Distance:    Right Eye Near:   Left Eye Near:    Bilateral Near:     Physical Exam Vitals reviewed.  Constitutional:      Appearance: Normal appearance.  Skin:     General: Skin is warm and dry.  Neurological:     General: No focal deficit present.     Mental Status: She is alert and oriented to person, place, and time.  Psychiatric:        Mood and Affect: Mood normal.        Behavior: Behavior normal.      UC Treatments / Results  Labs (all labs ordered are listed, but only abnormal results are displayed) Labs Reviewed  POCT URINALYSIS DIP (MANUAL ENTRY)    EKG   Radiology No results found.  Procedures Procedures (including critical care time)  Medications Ordered in UC Medications - No data to display  Initial Impression / Assessment and Plan / UC Course  I have reviewed the triage vital signs and the nursing notes.  Pertinent labs & imaging results that were available during my care of the patient were reviewed by me and considered in my medical decision making (see chart for details).   Acute cystitis with hematuria is indicated from UA containing blood, nitrite + and large leuks.  Treating With Macrobid and sending for culture to verify susceptibility. GFR WNL.   Final Clinical Impressions(s) / UC Diagnoses   Final diagnoses:  None   Discharge Instructions   None    ED Prescriptions   None    PDMP not reviewed this encounter.   Rose Phi, [place] 08/15/22 1844

## 2022-08-15 NOTE — Therapy (Signed)
OUTPATIENT PHYSICAL THERAPY FEMALE PELVIC TREATMENT   Patient Name: Shannon [name] MRN: [id number] DOB:05/28/1951, 72 y.o., female Today's Date: 08/15/2022   PT End of Session - 08/15/22 1021     Visit Number 11    Number of Visits 20    Date for PT Re-Evaluation 09/13/22    Authorization Type IE: 04/17/22; RC/PN: 06/28/22; PN: 12/21    Progress Note Due on Visit 10    PT Start Time 1018    PT Stop Time 1058    PT Time Calculation (min) 40 min    Activity Tolerance Patient tolerated treatment well    Behavior During Therapy [facility] for tasks assessed/performed             Past Medical History:  Diagnosis Date   GERD (gastroesophageal reflux disease)    Hyperlipidemia    Osteoporosis    now has osteopenia   Thyroid disease    Past Surgical History:  Procedure Laterality Date   HAND SURGERY     40 yrs ago   Patient Active Problem List   Diagnosis Date Noted   LFTs abnormal 11/10/2021   Toe deformity 11/10/2021   Cystitis 07/31/2021   Stress incontinence 11/08/2020   Stress 11/08/2020   Skin exam, screening for cancer 11/08/2020   Erythematous rash 10/15/2018   Ganglion cyst of finger 09/24/2018   Eczema of scalp 04/03/2017   Hair loss 06/02/2014   Encounter for general adult medical examination with abnormal findings 11/24/2013   Hyperlipidemia 05/20/2013   Hypothyroidism 05/20/2013   Osteoporosis 05/20/2013   Thyroid nodule 05/20/2013   Carpal tunnel syndrome 02/10/2013   Elevated TSH 02/10/2013    PCP: Leone Haven, MD  REFERRING PROVIDER: Hollice Espy, MD  REFERRING DIAG: N39.3 (ICD-10-CM) - Stress incontinence, female   THERAPY DIAG:  Pelvic floor dysfunction  Muscle weakness (generalized)  Other lack of coordination  Rationale for Evaluation and Treatment: Rehabilitation  ONSET DATE: 1 year                                                                                                                                                                                            PRECAUTIONS: None  WEIGHT BEARING RESTRICTIONS: No  FALLS:  Has patient fallen in last 6 months? 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 Pt also had a series of UTI's last year which is where she started taking cranberry and probiotic pills. Pt does hold her urine  occasionally because she gets distracted or is busy at work. Some days are worse than others in terms of leakage.     PATIENT GOALS: Pt would like to not have urinary leakage and not have to wear incontinence pads    UROLOGICAL HISTORY Fluid intake: 2 cups coffee in morning, sparkling water, pure water, white wine at night (no more than 2)   Pain with urination: No Fully empty bladder: No Stream: Strong Urgency: No  Frequency: 6-7x  Just in case: Yes  Nocturia: 1-2x  Leakage: Coughing, Sneezing, Laughing, Exercise, Lifting, and Bending forward Pads: Yes Type: Lvl 3, Poise Amount: 2x/day, can be more if doing any of the above  Bladder control (0-10): 6/10    OBSTETRICAL HISTORY Vaginal deliveries: G2P1 Tearing: yes    GYNECOLOGICAL HISTORY Hysterectomy: no Pelvic Organ Prolapse: None Pain with exam: no Heaviness/pressure: no   SUBJECTIVE:  Pt reported she hasn't been diligent with her exercises 2/2 the holidays. Pt is still leaking but switched to a smaller pad. Pt did have an episode of urgency incontinence on Saturday when she was drinking wine, and playing cards with friends. She has been walking on her treadmill, twice in the last week with no pad and did not leak walking a mile. Pt reported 50% improvement on GROC scale since beginning starting PT.   PAIN:  Are you having pain? No NPRS scale: 0/10     OBJECTIVE:    COGNITION: Overall cognitive status: Within functional limits  for tasks assessed     POSTURE:    TODAY'S TREATMENT  Neuromuscular Re-education: Access Code: [email or web address] URL: [email or web address] Date: 08/15/2022 Prepared by: Geoffry Paradise  Exercises - Supine Pelvic Floor Contraction  - 1-2 x daily - 7 x weekly - 1 sets - 10 reps - Sit to Stand with Pelvic Floor Contraction  - 1 x daily - 7 x weekly - 1 sets - 5 reps Posture, knack, 10 reps with urge.  Patient response to interventions: Pt is comfortable to progress to every other week  SELF CARE: Patient Education:  Patient provided with progressed HEP to reduce leakage and improve strength. PT educated pt on breath work, IAP, the importance of proper posture throughout the day, along with toileting posture. Patient educated throughout session on appropriate technique and form using multi-modal cueing, HEP, and activity modification. Patient will benefit from further education in order to maximize compliance and understanding for long-term therapeutic gains.      ASSESSMENT:  Clinical Impression: Today's skilled session focused on progressing HEP to include PFM contractions, relaxation and coordination with breath. Pt required cues to ensure proper technique but progressed to no cues. PT able to externally palpate (with pt's consent) PFM contraction to ensure proper technique. Pt would continue to benefit from skilled therapeutic intervention every other week to address remaining deficits in IAP management, PFM coordination, PFM strength/endurance, and LE strength, in order to increase PLOF and improve overall QOL.    Objective Impairments: decreased coordination, decreased endurance, decreased strength, improper body mechanics, and pain.   Activity Limitations: carrying, lifting, bending, squatting, continence, and toileting  Personal Factors: Age, Behavior pattern, Past/current experiences, Time since onset of injury/illness/exacerbation, and 1-2 comorbidities:  osteoporosis, hypothyroidism  are also affecting patient's functional outcome.   Rehab Potential: Good  Clinical Decision Making: Evolving/moderate complexity  Evaluation Complexity: Moderate   GOALS: Goals reviewed with patient? Yes  SHORT TERM GOALS: Target date: 05/22/2022  Patient will demonstrate independence with HEP in order to maximize therapeutic gains and  improve carryover from physical therapy sessions to ADLs in the home and community. Baseline: bladder irritants handout; (12/7): IND with HEP Goal status: MET   LONG TERM GOALS: Target date: 09/13/2022   Patient will score >/= 63 on FOTO Urinary Problem  in order to demonstrate improved IAP management, improved PFM coordination/strength, and overall QOL.  Baseline: 56; (12/7): 59 Goal status: IN PROGRESS  2.  Patient will report less than 5 incidents of stress urinary incontinence over the course of 3 weeks while coughing/sneezing/laughing/physical activity/lifting/bending in order to demonstrate improved PFM coordination, strength, and function for improved overall QOL. Baseline: urinary leakage with all the above; (12/7): continues to have urinary leakage with sneezing/walking longer periods of time/coughing but significantly less urinary leakage "dribbling" Goal status: IN PROGRESS  3.  Patient will report decreased reliance on protective undergarments as indicated by a 24 hour period to demonstrate improved bladder control and allow for increased participation in activities outside of the home. Baseline: 2x/day or more, Lvl 3 Poise ; (12/7): 1x/day, Lvl 3  Goal status: IN PROGRESS   4.  Patient will demonstrate circumferential and sequential contraction of >3/5 MMT, > 5 sec hold x5 and 5 consecutive quick flicks with </= 10 min rest between testing bouts, and relaxation of the PFM coordinated with breath for improved management of intra-abdominal pressure and normal bowel and bladder function without the presence of  pain nor incontinence in order to improve participation at home and in the community. Baseline: 4/5 MMT, no quick flicks tested Goal status: IN PROGRESS  5.  Patient will demonstrate coordinated lengthening and relaxation of PFM with diaphragmatic inhalation in order to decrease spasm and allow for unrestricted elimination of urine/feces for improved overall QOL. Baseline: present but inconsistent; (12/7): present completely  Goal status: MET    PLAN: PT Frequency: 1x/week  PT Duration: 10 weeks  Planned Interventions: Therapeutic exercises, Therapeutic activity, Neuromuscular re-education, Balance training, Gait training, Patient/Family education, Self Care, Joint mobilization, Cryotherapy, Moist heat, scar mobilization, Taping, and Manual therapy  Plan For Next Session:  review and progress HEP, continue with IAP management, bridging with one leg, single leg squat, SLS  Geoffry Paradise, PT,DPT 08/15/22 1:21 PM Phone: [phone] Fax: [phone]

## 2022-08-15 NOTE — ED Triage Notes (Signed)
Pt. Presents to UC w/ c/o dysuria and urinary frequency that started today. PT. Has used AZO for symptom control. Pt. states this is a recurrent issue for her. Pt. States last time she was treated w/ keflex and it did not resolve her symptoms

## 2022-08-15 NOTE — Discharge Instructions (Addendum)
Follow up here or with your primary care provider if your symptoms are worsening or not improving with treatment.     

## 2022-08-16 ENCOUNTER — Ambulatory Visit: Payer: PPO

## 2022-08-18 LAB — URINE CULTURE: Culture: <10000 — AB

## 2022-08-21 ENCOUNTER — Other Ambulatory Visit: Payer: Self-pay

## 2022-08-21 ENCOUNTER — Ambulatory Visit: Payer: PPO

## 2022-08-21 DIAGNOSIS — M6281 Muscle weakness (generalized): Secondary | ICD-10-CM

## 2022-08-21 DIAGNOSIS — R278 Other lack of coordination: Secondary | ICD-10-CM

## 2022-08-21 DIAGNOSIS — M6289 Other specified disorders of muscle: Secondary | ICD-10-CM

## 2022-08-21 NOTE — Therapy (Signed)
OUTPATIENT PHYSICAL THERAPY FEMALE PELVIC TREATMENT   Patient Name: Shannon [name] MRN: [id number] DOB:10-08-1950, 72 y.o., female Today's Date: 08/21/2022   PT End of Session - 08/21/22 0940     Visit Number 12    Number of Visits 20    Date for PT Re-Evaluation 09/13/22    Authorization Type IE: 04/17/22; RC/PN: 06/28/22; PN: 12/21    Progress Note Due on Visit 10    PT Start Time 0937    PT Stop Time 1015    PT Time Calculation (min) 38 min    Activity Tolerance Patient tolerated treatment well    Behavior During Therapy [facility] for tasks assessed/performed             Past Medical History:  Diagnosis Date   GERD (gastroesophageal reflux disease)    Hyperlipidemia    Osteoporosis    now has osteopenia   Thyroid disease    Past Surgical History:  Procedure Laterality Date   HAND SURGERY     40 yrs ago   Patient Active Problem List   Diagnosis Date Noted   LFTs abnormal 11/10/2021   Toe deformity 11/10/2021   Cystitis 07/31/2021   Stress incontinence 11/08/2020   Stress 11/08/2020   Skin exam, screening for cancer 11/08/2020   Erythematous rash 10/15/2018   Ganglion cyst of finger 09/24/2018   Eczema of scalp 04/03/2017   Hair loss 06/02/2014   Encounter for general adult medical examination with abnormal findings 11/24/2013   Hyperlipidemia 05/20/2013   Hypothyroidism 05/20/2013   Osteoporosis 05/20/2013   Thyroid nodule 05/20/2013   Carpal tunnel syndrome 02/10/2013   Elevated TSH 02/10/2013    PCP: Leone Haven, MD  REFERRING PROVIDER: Hollice Espy, MD  REFERRING DIAG: N39.3 (ICD-10-CM) - Stress incontinence, female   THERAPY DIAG:  Pelvic floor dysfunction  Muscle weakness (generalized)  Other lack of coordination  Rationale for Evaluation and Treatment: Rehabilitation  ONSET DATE: 1 year                                                                                                                                                                                            PRECAUTIONS: None  WEIGHT BEARING RESTRICTIONS: No  FALLS:  Has patient fallen in last 6 months? 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 Pt also had a series of UTI's last year which is where she started taking cranberry and probiotic pills. Pt does hold her urine  occasionally because she gets distracted or is busy at work. Some days are worse than others in terms of leakage.     PATIENT GOALS: Pt would like to not have urinary leakage and not have to wear incontinence pads    UROLOGICAL HISTORY Fluid intake: 2 cups coffee in morning, sparkling water, pure water, white wine at night (no more than 2)   Pain with urination: No Fully empty bladder: No Stream: Strong Urgency: No  Frequency: 6-7x  Just in case: Yes  Nocturia: 1-2x  Leakage: Coughing, Sneezing, Laughing, Exercise, Lifting, and Bending forward Pads: Yes Type: Lvl 3, Poise Amount: 2x/day, can be more if doing any of the above  Bladder control (0-10): 6/10    OBSTETRICAL HISTORY Vaginal deliveries: G2P1 Tearing: yes    GYNECOLOGICAL HISTORY Hysterectomy: no Pelvic Organ Prolapse: None Pain with exam: no Heaviness/pressure: no   SUBJECTIVE:  Pt reported she had a bladder infection after our last appt. And finished antibiotics yesterday and feels better. She was practicing PFM contractions while coughing and STS txfs. She's trying not to wear a pad or pantyliners right now. Pt feels like leakage isn't as bad as it was, she's more conscious about contracting PFM with breath. Pt is up to walking every other day on treadmill at home, with minimal leakage.  PAIN:  Are you having pain? No NPRS scale: 0/10     OBJECTIVE:    COGNITION: Overall cognitive status: Within functional limits for  tasks assessed     POSTURE:    TODAY'S TREATMENT  Neuromuscular Re-education: Access Code: [email or web address] URL: [email or web address] Date: 08/21/2022 Prepared by: Geoffry Paradise  Exercises -Diaphragmatic breathing x 10 reps. - Supine Pelvic Floor Contraction  - 1-2 x daily - 7 x weekly - 1 sets - 10 reps and 5 reps of 10 sec.holds. Plus, progressed to Q91 reps of quick flicks with R and L knee in and outs. - Sit to Stand with Pelvic Floor Contraction  - 1 x daily - 7 x weekly - 1 sets - 5 reps - Dead Bug  - 1 x daily - 3 x weekly - 2-3 sets - 10 reps Cues and demo for proper technique and coordination with breath. S for safety.    Patient response to interventions: Pt stated she felt good after session and could really feel her core activating.  SELF CARE: Patient Education:  Patient provided with progressed HEP. PT educated pt on performing PFM contractions during add'l functional activities such as getting in/out bed and car. Patient educated throughout session on appropriate technique and form using multi-modal cueing, HEP, and activity modification. Pt educated on [employer] brand hygiene products that do not have bleach or other harmful, irritating ingredients in pads and pantyliners. Patient will benefit from further education in order to maximize compliance and understanding for long-term therapeutic gains.      ASSESSMENT:  Clinical Impression: Today's skilled session focused on progressing HEP to include functional motions during PFM contractions, and to improve deep core strength and coordination. Pt required cues to ensure proper technique but progressed to no cues. Pt continues to experience intermittent leakage but also experienced a UTI last week that incr. Frequency and leakage. Pt would continue to benefit from skilled therapeutic intervention every other week to address remaining deficits in IAP management, PFM coordination, PFM strength/endurance, and LE  strength, in order to increase PLOF and improve overall QOL.    Objective Impairments: decreased coordination, decreased endurance, decreased strength, improper body mechanics, and pain.  Activity Limitations: carrying, lifting, bending, squatting, continence, and toileting  Personal Factors: Age, Behavior pattern, Past/current experiences, Time since onset of injury/illness/exacerbation, and 1-2 comorbidities: osteoporosis, hypothyroidism  are also affecting patient's functional outcome.   Rehab Potential: Good  Clinical Decision Making: Evolving/moderate complexity  Evaluation Complexity: Moderate   GOALS: Goals reviewed with patient? Yes  SHORT TERM GOALS: Target date: 05/22/2022  Patient will demonstrate independence with HEP in order to maximize therapeutic gains and improve carryover from physical therapy sessions to ADLs in the home and community. Baseline: bladder irritants handout; (12/7): IND with HEP Goal status: MET   LONG TERM GOALS: Target date: 09/13/2022   Patient will score >/= 63 on FOTO Urinary Problem  in order to demonstrate improved IAP management, improved PFM coordination/strength, and overall QOL.  Baseline: 56; (12/7): 59 Goal status: IN PROGRESS  2.  Patient will report less than 5 incidents of stress urinary incontinence over the course of 3 weeks while coughing/sneezing/laughing/physical activity/lifting/bending in order to demonstrate improved PFM coordination, strength, and function for improved overall QOL. Baseline: urinary leakage with all the above; (12/7): continues to have urinary leakage with sneezing/walking longer periods of time/coughing but significantly less urinary leakage "dribbling" Goal status: IN PROGRESS  3.  Patient will report decreased reliance on protective undergarments as indicated by a 24 hour period to demonstrate improved bladder control and allow for increased participation in activities outside of the home. Baseline:  2x/day or more, Lvl 3 Poise ; (12/7): 1x/day, Lvl 3  Goal status: IN PROGRESS   4.  Patient will demonstrate circumferential and sequential contraction of >3/5 MMT, > 5 sec hold x5 and 5 consecutive quick flicks with </= 10 min rest between testing bouts, and relaxation of the PFM coordinated with breath for improved management of intra-abdominal pressure and normal bowel and bladder function without the presence of pain nor incontinence in order to improve participation at home and in the community. Baseline: 4/5 MMT, no quick flicks tested Goal status: IN PROGRESS  5.  Patient will demonstrate coordinated lengthening and relaxation of PFM with diaphragmatic inhalation in order to decrease spasm and allow for unrestricted elimination of urine/feces for improved overall QOL. Baseline: present but inconsistent; (12/7): present completely  Goal status: MET    PLAN: PT Frequency: 1x/week  PT Duration: 10 weeks  Planned Interventions: Therapeutic exercises, Therapeutic activity, Neuromuscular re-education, Balance training, Gait training, Patient/Family education, Self Care, Joint mobilization, Cryotherapy, Moist heat, scar mobilization, Taping, and Manual therapy  Plan For Next Session:  check LTGs, progress HEP, continue with IAP management, bridging with one leg, single leg squat, SLS  Geoffry Paradise, PT,DPT 08/21/22 9:40 AM Phone: [phone] Fax: [phone]

## 2022-08-22 ENCOUNTER — Encounter: Payer: Self-pay | Admitting: Family Medicine

## 2022-08-26 ENCOUNTER — Telehealth: Payer: PPO | Admitting: Urgent Care

## 2022-08-26 DIAGNOSIS — R3989 Other symptoms and signs involving the genitourinary system: Secondary | ICD-10-CM | POA: Diagnosis not present

## 2022-08-26 DIAGNOSIS — R350 Frequency of micturition: Secondary | ICD-10-CM | POA: Diagnosis not present

## 2022-08-26 MED ORDER — CIPROFLOXACIN HCL 250 MG PO TABS: 250.0000 mg | ORAL_TABLET | Freq: Two times a day (BID) | ORAL | 0 refills | Status: AC

## 2022-08-26 NOTE — Progress Notes (Signed)
Virtual Visit Consent   Shannon [name], you are scheduled for a virtual visit with a [facility] provider today. Just as with appointments in the office, your consent must be obtained to participate. Your consent will be active for this visit and any virtual visit you may have with one of our providers in the next 365 days. If you have a MyChart account, a copy of this consent can be sent to you electronically.  As this is a virtual visit, video technology does not allow for your provider to perform a traditional examination. This may limit your provider's ability to fully assess your condition. If your provider identifies any concerns that need to be evaluated in person or the need to arrange testing (such as labs, EKG, etc.), we will make arrangements to do so. Although advances in technology are sophisticated, we cannot ensure that it will always work on either your end or our end. If the connection with a video visit is poor, the visit may have to be switched to a telephone visit. With either a video or telephone visit, we are not always able to ensure that we have a secure connection.  By engaging in this virtual visit, you consent to the provision of healthcare and authorize for your insurance to be billed (if applicable) for the services provided during this visit. Depending on your insurance coverage, you may receive a charge related to this service.  I need to obtain your verbal consent now. Are you willing to proceed with your visit today? Shannon [name] has provided verbal consent on 08/26/2022 for a virtual visit (video or telephone). Chaney Malling, PA  Date: 08/26/2022 10:45 AM  Virtual Visit via Video Note   I, [place], connected with  Shannon [name]  ([id number], 03-Mar-1951) on 08/26/22 at 10:15 AM EST by a video-enabled telemedicine application and verified that I am speaking with the correct person using two identifiers.  Location: Patient: Virtual Visit Location Patient:  Home Provider: Virtual Visit Location Provider: Home Office   I discussed the limitations of evaluation and management by telemedicine and the availability of in person appointments. The patient expressed understanding and agreed to proceed.    History of Present Illness: Shannon [name] is a 72 y.o. who identifies as a female who was assigned female at birth, and is being seen today for recurrent UTI.  HPI: 72yo female presents today due to recurrent UTI. Has had numerous infections in the past. Follows with urology although finds it hard to get in for an appointment. Last appointment was in May 2023. Has been prescribed keflex and macrobid numerous times in the past, states this doesn't seem to work as well. Urology has favored ciprofloxacin in the past. Last visit was on 08/15/22 at [facility] in which urine dipstick was positive, however culture showed insignificant growth. Pt concerned this was a more significant infection, but had previously taken cephalexin that she had at home prior to that visit. Feels that the macrobid did not fully resolve her infection. Continues to have frequency, urgency, incomplete bladder emptying and dysuria, much worse this morning. Denies flank pain, fever, hematuria. Used to take estrogen vaginal cream but stopped a while back as she thought she didn't need it. Sx increased in frequency after cessation. Pt admits that pushing on bladder after using bathroom does produce more urine. Currently following with PT due to stress incontinence.     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   Cystitis 07/31/2021   Stress incontinence 11/08/2020   Stress 11/08/2020   Skin exam, screening for cancer 11/08/2020   Erythematous rash 10/15/2018   Ganglion cyst of finger 09/24/2018   Eczema of scalp 04/03/2017   Hair loss 06/02/2014   Encounter for general adult medical examination with abnormal findings  11/24/2013   Hyperlipidemia 05/20/2013   Hypothyroidism 05/20/2013   Osteoporosis 05/20/2013   Thyroid nodule 05/20/2013   Carpal tunnel syndrome 02/10/2013   Elevated TSH 02/10/2013    Allergies: No Known Allergies Medications:  Current Outpatient Medications:    ciprofloxacin (CIPRO) 250 MG tablet, Take 1 tablet (250 mg total) by mouth 2 (two) times daily for 5 days., Disp: 10 tablet, Rfl: 0   b complex vitamins capsule, Take by mouth., Disp: , Rfl:    Calcium Carb-Cholecalciferol 600-10 MG-MCG TABS, , Disp: , Rfl:    Cholecalciferol (D3 HIGH POTENCY) 125 MCG (5000 UT) capsule, , Disp: , Rfl:    Cranberry-Vitamin C-Vitamin E (CRANBERRY PLUS VITAMIN C) 4200-20-3 MG-MG-UNIT CAPS, , Disp: , Rfl:    estradiol (ESTRACE) 0.1 MG/GM vaginal cream, 1 gram intravaginally at bedtime daily for 2-3 weeks, then decrease to 3 times per week, Disp: 42.5 g, Rfl: 12   levothyroxine (SYNTHROID) 88 MCG tablet, TAKE ONE TABLET BY MOUTH DAILY, Disp: 90 tablet, Rfl: 3   Multiple Vitamins-Minerals (CENTRUM SILVER PO), Take 1 tablet by mouth daily., Disp: , Rfl:    Probiotic Product (PROBIOTIC-10 ULTIMATE PO), Take by mouth., Disp: , Rfl:    simvastatin (ZOCOR) 20 MG tablet, Take 1 tablet (20 mg total) by mouth every evening., Disp: 90 tablet, Rfl: 0  Observations/Objective: Patient is well-developed, well-nourished in no acute distress.  Resting comfortably at home.  Head is normocephalic, atraumatic.  No labored breathing.  Speech is clear and coherent with logical content.  Patient is alert and oriented at baseline.    Assessment and Plan: 1. Suspected UTI  2. Urinary frequency  Pt with sx of recurrent UTI. Last urine cx showed insignificant growth, however pt had been taking cephalexin prior to her visit therefore question if this was the reason for the cx results. Read urology note, they had recommended ciprofloxacin '250mg'$  BID x 5 days given lack of response to cephs and macrobid in the past. Pt  tolerated well. Did discuss BBW with pt. We also discussed other things that mimic UTIs, such as urinary retention and atrophic vaginitis. Recommended pt restart her topical estrogen cream as sx worsened since cessation. F/U with urology for post void residual [place] to determine if flomax may help. Take OTC cranberry supplements to help prevent UTIs.  Follow Up Instructions: I discussed the assessment and treatment plan with the patient. The patient was provided an opportunity to ask questions and all were answered. The patient agreed with the plan and demonstrated an understanding of the instructions.  A copy of instructions were sent to the patient via MyChart unless otherwise noted below.    The patient was advised to call back or seek an in-person evaluation if the symptoms worsen or if the condition fails to improve as anticipated.  Time:  I spent 12 minutes with the patient via telehealth technology discussing the above problems/concerns.    [place], PA

## 2022-08-26 NOTE — Patient Instructions (Signed)
Ancil Linsey, thank you for joining Chaney Malling, PA for today's virtual visit.  While this provider is not your primary care provider (PCP), if your PCP is located in our provider database this encounter information will be shared with them immediately following your visit.   [place] account gives you access to today's visit and all your visits, tests, and labs performed at [facility] " click here if you don't have a [facility] account or go to [email or web address]  Consent: (Patient) Ancil Linsey provided verbal consent for this virtual visit at the beginning of the encounter.  Current Medications:  Current Outpatient Medications:    ciprofloxacin (CIPRO) 250 MG tablet, Take 1 tablet (250 mg total) by mouth 2 (two) times daily for 5 days., Disp: 10 tablet, Rfl: 0   b complex vitamins capsule, Take by mouth., Disp: , Rfl:    Calcium Carb-Cholecalciferol 600-10 MG-MCG TABS, , Disp: , Rfl:    Cholecalciferol (D3 HIGH POTENCY) 125 MCG (5000 UT) capsule, , Disp: , Rfl:    Cranberry-Vitamin C-Vitamin E (CRANBERRY PLUS VITAMIN C) 4200-20-3 MG-MG-UNIT CAPS, , Disp: , Rfl:    estradiol (ESTRACE) 0.1 MG/GM vaginal cream, 1 gram intravaginally at bedtime daily for 2-3 weeks, then decrease to 3 times per week, Disp: 42.5 g, Rfl: 12   levothyroxine (SYNTHROID) 88 MCG tablet, TAKE ONE TABLET BY MOUTH DAILY, Disp: 90 tablet, Rfl: 3   Multiple Vitamins-Minerals (CENTRUM SILVER PO), Take 1 tablet by mouth daily., Disp: , Rfl:    Probiotic Product (PROBIOTIC-10 ULTIMATE PO), Take by mouth., Disp: , Rfl:    simvastatin (ZOCOR) 20 MG tablet, Take 1 tablet (20 mg total) by mouth every evening., Disp: 90 tablet, Rfl: 0   Medications ordered in this encounter:  Meds ordered this encounter  Medications   ciprofloxacin (CIPRO) 250 MG tablet    Sig: Take 1 tablet (250 mg total) by mouth 2 (two) times daily for 5 days.    Dispense:  10 tablet    Refill:  0     Order Specific Question:   Supervising Provider    Answer:   Chase Picket [id number]     *If you need refills on other medications prior to your next appointment, please contact your pharmacy*  Follow-Up: Call back or seek an in-person evaluation if the symptoms worsen or if the condition fails to improve as anticipated.  [place] [phone]  Other Instructions Start ciprofloxacin twice daily x 5 days. If you develop any pain in your heel/ achilles tendon, stop the medication immediately. Please read all black box warnings. Drink plenty of water and avoid excessive sunlight while taking the medication. Please restart your topical estrogen cream as atrophic vaginitis can mimic urinary tract infections. Please follow up with your urologist. You may want to consider a workup for urinary retention to determine if this is contributing to your symptoms. This is typically diagnosed with a post-void residual ultrasound. To prevent UTIs, always wipe front to back, urinate after intercourse, avoid bubble baths. Consider taking an OTC cranberry supplement. If you develop fever, flank pain, or large amounts of blood in the urine, please head to the ER.   If you have been instructed to have an in-person evaluation today at a local Urgent Care facility, please use the link below. It will take you to a list of all of our available [facility], including address, phone number and hours of operation. Please  do not delay care.  [facility]  If you or a family member do not have a primary care provider, use the link below to schedule a visit and establish care. When you choose a [facility] primary care physician or advanced practice provider, you gain a long-term partner in health. Find a Primary Care Provider  Learn more about [facility]'s in-office and virtual care options: [place] [facility] Now

## 2022-09-01 ENCOUNTER — Telehealth: Payer: PPO | Admitting: Physician Assistant

## 2022-09-01 DIAGNOSIS — N390 Urinary tract infection, site not specified: Secondary | ICD-10-CM | POA: Diagnosis not present

## 2022-09-01 MED ORDER — CIPROFLOXACIN HCL 250 MG PO TABS: 250.0000 mg | ORAL_TABLET | Freq: Two times a day (BID) | ORAL | 0 refills | Status: AC

## 2022-09-01 NOTE — Patient Instructions (Signed)
  Shannon [name], thank you for joining [place], PA-C for today's virtual visit.  While this provider is not your primary care provider (PCP), if your PCP is located in our provider database this encounter information will be shared with them immediately following your visit.   [place] account gives you access to today's visit and all your visits, tests, and labs performed at [facility] " click here if you don't have a [facility] account or go to [email or web address]  Consent: (Patient) Shannon [name] provided verbal consent for this virtual visit at the beginning of the encounter.  Current Medications:  Current Outpatient Medications:    ciprofloxacin (CIPRO) 250 MG tablet, Take 1 tablet (250 mg total) by mouth 2 (two) times daily for 5 days., Disp: 10 tablet, Rfl: 0   b complex vitamins capsule, Take by mouth., Disp: , Rfl:    Calcium Carb-Cholecalciferol 600-10 MG-MCG TABS, , Disp: , Rfl:    Cholecalciferol (D3 HIGH POTENCY) 125 MCG (5000 UT) capsule, , Disp: , Rfl:    Cranberry-Vitamin C-Vitamin E (CRANBERRY PLUS VITAMIN C) 4200-20-3 MG-MG-UNIT CAPS, , Disp: , Rfl:    estradiol (ESTRACE) 0.1 MG/GM vaginal cream, 1 gram intravaginally at bedtime daily for 2-3 weeks, then decrease to 3 times per week, Disp: 42.5 g, Rfl: 12   levothyroxine (SYNTHROID) 88 MCG tablet, TAKE ONE TABLET BY MOUTH DAILY, Disp: 90 tablet, Rfl: 3   Multiple Vitamins-Minerals (CENTRUM SILVER PO), Take 1 tablet by mouth daily., Disp: , Rfl:    Probiotic Product (PROBIOTIC-10 ULTIMATE PO), Take by mouth., Disp: , Rfl:    simvastatin (ZOCOR) 20 MG tablet, Take 1 tablet (20 mg total) by mouth every evening., Disp: 90 tablet, Rfl: 0   Medications ordered in this encounter:  Meds ordered this encounter  Medications   ciprofloxacin (CIPRO) 250 MG tablet    Sig: Take 1 tablet (250 mg total) by mouth 2 (two) times daily for 5 days.    Dispense:  10 tablet    Refill:  0     Order Specific Question:   Supervising Provider    Answer:   Chase Picket [id number]     *If you need refills on other medications prior to your next appointment, please contact your pharmacy*  Follow-Up: Call back or seek an in-person evaluation if the symptoms worsen or if the condition fails to improve as anticipated.  [place] [phone]  Other Instructions Take antibiotic if you are experiencing UTI symptoms.  Follow up with Urology when you return.    If you have been instructed to have an in-person evaluation today at a local Urgent Care facility, please use the link below. It will take you to a list of all of our available [facility], including address, phone number and hours of operation. Please do not delay care.  [facility]  If you or a family member do not have a primary care provider, use the link below to schedule a visit and establish care. When you choose a [facility] primary care physician or advanced practice provider, you gain a long-term partner in health. Find a Primary Care Provider  Learn more about [facility]'s in-office and virtual care options: [facility] Now

## 2022-09-01 NOTE — Progress Notes (Signed)
Virtual Visit Consent   Shannon [name], you are scheduled for a virtual visit with a [facility] provider today. Just as with appointments in the office, your consent must be obtained to participate. Your consent will be active for this visit and any virtual visit you may have with one of our providers in the next 365 days. If you have a MyChart account, a copy of this consent can be sent to you electronically.  As this is a virtual visit, video technology does not allow for your provider to perform a traditional examination. This may limit your provider's ability to fully assess your condition. If your provider identifies any concerns that need to be evaluated in person or the need to arrange testing (such as labs, EKG, etc.), we will make arrangements to do so. Although advances in technology are sophisticated, we cannot ensure that it will always work on either your end or our end. If the connection with a video visit is poor, the visit may have to be switched to a telephone visit. With either a video or telephone visit, we are not always able to ensure that we have a secure connection.  By engaging in this virtual visit, you consent to the provision of healthcare and authorize for your insurance to be billed (if applicable) for the services provided during this visit. Depending on your insurance coverage, you may receive a charge related to this service.  I need to obtain your verbal consent now. Are you willing to proceed with your visit today? Shannon [name] has provided verbal consent on 09/01/2022 for a virtual visit (video or telephone). Lenise Arena Ward, PA-C  Date: 09/01/2022 10:57 AM  Virtual Visit via Video Note   I, Lenise Arena Ward, connected with  Shannon [name]  ([id number], 1951/04/20) on 09/01/22 at 10:45 AM EST by a video-enabled telemedicine application and verified that I am speaking with the correct person using two identifiers.  Location: Patient: Virtual Visit Location Patient:  Home Provider: Virtual Visit Location Provider: Home Office   I discussed the limitations of evaluation and management by telemedicine and the availability of in person appointments. The patient expressed understanding and agreed to proceed.    History of Present Illness: Shannon [name] is a 72 y.o. who identifies as a female who was assigned female at birth, and is being seen today for recurrent UTI sx.  She had a video visit on 08/26/2022  Prescribed Cipro.  She reports sx have improved since that visit.  She denies fever, chills, n/v, abdominal pain.  She reports she reached out to PCP to discussed recurrent UTIs, will make an appointment.  She is going out of town tomorrow and will be one for one week.  She is fearful that she will develop sx while out of town and not be able to do a video visit.  She is requesting a refill of cipro.  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/2017   Hair loss 06/02/2014   Encounter for general adult medical examination with abnormal findings 11/24/2013   Hyperlipidemia 05/20/2013   Hypothyroidism 05/20/2013   Osteoporosis 05/20/2013   Thyroid nodule 05/20/2013   Carpal  tunnel syndrome 02/10/2013   Elevated TSH 02/10/2013    Allergies: No Known Allergies Medications:  Current Outpatient Medications:    ciprofloxacin (CIPRO) 250 MG tablet, Take 1 tablet (250 mg total) by mouth 2 (two) times daily for 5 days., Disp: 10 tablet, Rfl: 0   b complex vitamins capsule, Take by mouth., Disp: , Rfl:    Calcium Carb-Cholecalciferol 600-10 MG-MCG TABS, , Disp: , Rfl:    Cholecalciferol (D3 HIGH POTENCY) 125 MCG (5000 UT) capsule, , Disp: , Rfl:    Cranberry-Vitamin  C-Vitamin E (CRANBERRY PLUS VITAMIN C) 4200-20-3 MG-MG-UNIT CAPS, , Disp: , Rfl:    estradiol (ESTRACE) 0.1 MG/GM vaginal cream, 1 gram intravaginally at bedtime daily for 2-3 weeks, then decrease to 3 times per week, Disp: 42.5 g, Rfl: 12   levothyroxine (SYNTHROID) 88 MCG tablet, TAKE ONE TABLET BY MOUTH DAILY, Disp: 90 tablet, Rfl: 3   Multiple Vitamins-Minerals (CENTRUM SILVER PO), Take 1 tablet by mouth daily., Disp: , Rfl:    Probiotic Product (PROBIOTIC-10 ULTIMATE PO), Take by mouth., Disp: , Rfl:    simvastatin (ZOCOR) 20 MG tablet, Take 1 tablet (20 mg total) by mouth every evening., Disp: 90 tablet, Rfl: 0  Observations/Objective: Patient is well-developed, well-nourished in no acute distress.  Resting comfortably at home.  Head is normocephalic, atraumatic.  No labored breathing.  Speech is clear and coherent with logical content.  Patient is alert and oriented at baseline.    Assessment and Plan: 1. Recurrent UTI - ciprofloxacin (CIPRO) 250 MG tablet; Take 1 tablet (250 mg total) by mouth 2 (two) times daily for 5 days.  Dispense: 10 tablet; Refill: 0    Given frequency of UTIs and patient going out of town will refill antibiotic.  Advised to hold unless she is experiencing sx.  Discussed the importance of following up with urology when she returns to discuss recurrence and have a UA.   Follow Up Instructions: I discussed the assessment and treatment plan with the patient. The patient was provided an opportunity to ask questions and all were answered. The patient agreed with the plan and demonstrated an understanding of the instructions.  A copy of instructions were sent to the patient via MyChart unless otherwise noted below.     The patient was advised to call back or seek an in-person evaluation if the symptoms worsen or if the condition fails to improve as anticipated.  Time:  I spent 10 minutes with the patient via telehealth technology discussing the above  problems/concerns.    Lenise Arena Ward, PA-C

## 2022-09-11 ENCOUNTER — Ambulatory Visit: Payer: PPO | Attending: Urology

## 2022-09-11 ENCOUNTER — Other Ambulatory Visit: Payer: Self-pay

## 2022-09-11 DIAGNOSIS — M6281 Muscle weakness (generalized): Secondary | ICD-10-CM | POA: Insufficient documentation

## 2022-09-11 DIAGNOSIS — R278 Other lack of coordination: Secondary | ICD-10-CM | POA: Insufficient documentation

## 2022-09-11 DIAGNOSIS — M6289 Other specified disorders of muscle: Secondary | ICD-10-CM | POA: Insufficient documentation

## 2022-09-11 NOTE — Therapy (Signed)
OUTPATIENT PHYSICAL THERAPY FEMALE PELVIC TREATMENT/recert   Patient Name: Shannon [name] MRN: [id number] DOB:05-Sep-1950, 72 y.o., female Today's Date: 09/11/2022   PT End of Session - 09/11/22 0946     Visit Number 13    Number of Visits 20    Date for PT Re-Evaluation 09/13/22    Authorization Type IE: 04/17/22; RC/PN: 06/28/22; PN: 12/21    Progress Note Due on Visit 10    PT Start Time 0937    PT Stop Time 1015    PT Time Calculation (min) 38 min    Activity Tolerance Patient tolerated treatment well    Behavior During Therapy [facility] for tasks assessed/performed             Past Medical History:  Diagnosis Date   GERD (gastroesophageal reflux disease)    Hyperlipidemia    Osteoporosis    now has osteopenia   Thyroid disease    Past Surgical History:  Procedure Laterality Date   HAND SURGERY     40 yrs ago   Patient Active Problem List   Diagnosis Date Noted   LFTs abnormal 11/10/2021   Toe deformity 11/10/2021   Cystitis 07/31/2021   Stress incontinence 11/08/2020   Stress 11/08/2020   Skin exam, screening for cancer 11/08/2020   Erythematous rash 10/15/2018   Ganglion cyst of finger 09/24/2018   Eczema of scalp 04/03/2017   Hair loss 06/02/2014   Encounter for general adult medical examination with abnormal findings 11/24/2013   Hyperlipidemia 05/20/2013   Hypothyroidism 05/20/2013   Osteoporosis 05/20/2013   Thyroid nodule 05/20/2013   Carpal tunnel syndrome 02/10/2013   Elevated TSH 02/10/2013    PCP: Leone Haven, MD  REFERRING PROVIDER: Hollice Espy, MD  REFERRING DIAG: N39.3 (ICD-10-CM) - Stress incontinence, female   THERAPY DIAG:  Pelvic floor dysfunction  Muscle weakness (generalized)  Other lack of coordination  Rationale for Evaluation and Treatment: Rehabilitation  ONSET DATE: 1 year                                                                                                                                                                                            PRECAUTIONS: None  WEIGHT BEARING RESTRICTIONS: No  FALLS:  Has patient fallen in last 6 months? 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 Pt also had a series of UTI's last year which is where she started taking cranberry and probiotic pills. Pt does hold her urine  occasionally because she gets distracted or is busy at work. Some days are worse than others in terms of leakage.     PATIENT GOALS: Pt would like to not have urinary leakage and not have to wear incontinence pads    UROLOGICAL HISTORY Fluid intake: 2 cups coffee in morning, sparkling water, pure water, white wine at night (no more than 2)   Pain with urination: No Fully empty bladder: No Stream: Strong Urgency: No  Frequency: 6-7x  Just in case: Yes  Nocturia: 1-2x  Leakage: Coughing, Sneezing, Laughing, Exercise, Lifting, and Bending forward Pads: Yes Type: Lvl 3, Poise Amount: 2x/day, can be more if doing any of the above  Bladder control (0-10): 6/10    OBSTETRICAL HISTORY Vaginal deliveries: G2P1 Tearing: yes    GYNECOLOGICAL HISTORY Hysterectomy: no Pelvic Organ Prolapse: None Pain with exam: no Heaviness/pressure: no   SUBJECTIVE:  Pt reported she is able to walk 3x/week on treadmill without leakage but has minimal leakage while amb. Over uneven terrain. Pt wears Lvl 2 poise pad now vs. Lvl 3. Pt reports 50-60% improvement since starting PHPT. Pt reported MD changed antibiotics (cipro 227m) for UTI as it didn't completely resolve last time but she feels better now. Pt reported volume of leakage is reduced.  PAIN:  Are you having pain? No NPRS scale: 0/10     OBJECTIVE:    COGNITION: Overall cognitive status: Within functional limits for tasks  assessed     POSTURE:    TODAY'S TREATMENT  Neuromuscular Re-education: Access Code: [email or web address]: [email or web address] Date: 08/21/2022 Prepared by: JGeoffry Paradise Exercises: reviewed and performed. -Diaphragmatic breathing x 10 reps. - Supine Pelvic Floor Contraction  - 1-2 x daily - 7 x weekly - 1 sets - 10 reps and 5 reps of 10 sec.holds. Plus, progressed to [id number] of quick flicks with R and L knee in and outs. Pt able to perform with breath. - Sit to Stand with Pelvic Floor Contraction  - 1 x daily - 7 x weekly - 1 sets - 5 reps - Dead Bug  - 1 x daily - 3 x weekly - 2-3 sets - 10 reps (taking rest breaks to improve breathing coordination) Cues and demo for proper technique and coordination with breath. S for safety.    Patient response to interventions: Pt stated she felt good after session and could really feel her core activating.  SELF CARE: Patient Education:  PT reviewed LTG progress and reduced frequency of PT based on good progress. PT reviewed HEP and provided cues as indicated. PT educated pt on proper toileting posture to reduce strain and fully empty bladder. Patient will benefit from further education in order to maximize compliance and understanding for long-term therapeutic gains.      ASSESSMENT:  Clinical Impression: Today's skilled session focused on assess LTGs and educating on toileting posture. Pt able to partially meet all LTGs and FOTO goal deferred until end of POC. Pt would continue to benefit from skilled therapeutic intervention every other week to address remaining deficits in IAP management, PFM coordination, PFM strength/endurance, and LE strength, in order to increase PLOF and improve overall QOL.  PT requesting additional 4  visits over 8 weeks to continue progress.   Objective Impairments: decreased coordination, decreased endurance, decreased strength, improper body mechanics, and pain.   Activity Limitations:  carrying, lifting, bending, squatting, continence, and toileting  Personal Factors: Age, Behavior pattern, Past/current experiences, Time since onset of injury/illness/exacerbation, and 1-2 comorbidities: osteoporosis, hypothyroidism  are also affecting patient's functional outcome.   Rehab Potential: Good  Clinical Decision Making: Evolving/moderate complexity  Evaluation Complexity: Moderate   GOALS: Goals reviewed with patient? Yes  SHORT TERM GOALS: Target date: 05/22/2022  Patient will demonstrate independence with HEP in order to maximize therapeutic gains and improve carryover from physical therapy sessions to ADLs in the home and community. Baseline: bladder irritants handout; (12/7): IND with HEP Goal status: MET   LONG TERM GOALS: Target date: 09/13/2022   Patient will score >/= 63 on FOTO Urinary Problem  in order to demonstrate improved IAP management, improved PFM coordination/strength, and overall QOL.  Baseline: 56; (12/7): 59 Goal status: IN PROGRESS (deferred)  2.  Patient will report less than 5 incidents of stress urinary incontinence over the course of 3 weeks while coughing/sneezing/laughing/physical activity/lifting/bending in order to demonstrate improved PFM coordination, strength, and function for improved overall QOL. Baseline: urinary leakage with all the above; (12/7): continues to have urinary leakage with sneezing/walking longer periods of time/coughing but significantly less urinary leakage "dribbling" Goal status: PARTIALLY MET  3.  Patient will report decreased reliance on protective undergarments as indicated by a 24 hour period to demonstrate improved bladder control and allow for increased participation in activities outside of the home. Baseline: 2x/day or more, Lvl 3 Poise ; (12/7): 1x/day, Lvl 3 ; 2/20: lvl 2 and wears one a day. Daily leakage but less volume. Goal status: PARTIALLY MET  4.  Patient will demonstrate circumferential and  sequential contraction of >3/5 MMT, > 5 sec hold x5 and 5 consecutive quick flicks with </= 10 min rest between testing bouts, and relaxation of the PFM coordinated with breath for improved management of intra-abdominal pressure and normal bowel and bladder function without the presence of pain nor incontinence in order to improve participation at home and in the community. Baseline: 4/5 MMT, no quick flicks tested; [id number]: external 4/5 with improved relaxation but strength decr. To 3/5 by 3-4 reps. Goal status: PARTIALLY MET  5.  Patient will demonstrate coordinated lengthening and relaxation of PFM with diaphragmatic inhalation in order to decrease spasm and allow for unrestricted elimination of urine/feces for improved overall QOL. Baseline: present but inconsistent; (12/7): present completely  Goal status: MET    PLAN: PT Frequency: 1x/week  PT Duration: 10 weeks  Planned Interventions: Therapeutic exercises, Therapeutic activity, Neuromuscular re-education, Balance training, Gait training, Patient/Family education, Self Care, Joint mobilization, Cryotherapy, Moist heat, scar mobilization, Taping, and Manual therapy  Plan For Next Session:  progress HEP, continue with IAP management, bridging with one leg, single leg squat, SLS  Geoffry Paradise, PT,DPT 09/11/22 11:45 AM Phone: [phone] Fax: [phone]

## 2022-09-17 DIAGNOSIS — L298 Other pruritus: Secondary | ICD-10-CM | POA: Diagnosis not present

## 2022-09-17 DIAGNOSIS — Z872 Personal history of diseases of the skin and subcutaneous tissue: Secondary | ICD-10-CM | POA: Diagnosis not present

## 2022-09-17 DIAGNOSIS — Z86018 Personal history of other benign neoplasm: Secondary | ICD-10-CM | POA: Diagnosis not present

## 2022-09-17 DIAGNOSIS — M9902 Segmental and somatic dysfunction of thoracic region: Secondary | ICD-10-CM | POA: Diagnosis not present

## 2022-09-17 DIAGNOSIS — L578 Other skin changes due to chronic exposure to nonionizing radiation: Secondary | ICD-10-CM | POA: Diagnosis not present

## 2022-09-17 DIAGNOSIS — L570 Actinic keratosis: Secondary | ICD-10-CM | POA: Diagnosis not present

## 2022-09-17 DIAGNOSIS — L249 Irritant contact dermatitis, unspecified cause: Secondary | ICD-10-CM | POA: Diagnosis not present

## 2022-09-17 DIAGNOSIS — L821 Other seborrheic keratosis: Secondary | ICD-10-CM | POA: Diagnosis not present

## 2022-09-17 DIAGNOSIS — M5033 Other cervical disc degeneration, cervicothoracic region: Secondary | ICD-10-CM | POA: Diagnosis not present

## 2022-09-17 DIAGNOSIS — M5412 Radiculopathy, cervical region: Secondary | ICD-10-CM | POA: Diagnosis not present

## 2022-09-17 DIAGNOSIS — M9901 Segmental and somatic dysfunction of cervical region: Secondary | ICD-10-CM | POA: Diagnosis not present

## 2022-09-25 ENCOUNTER — Other Ambulatory Visit: Payer: Self-pay

## 2022-09-25 ENCOUNTER — Ambulatory Visit: Payer: PPO | Attending: Urology

## 2022-09-25 DIAGNOSIS — M6281 Muscle weakness (generalized): Secondary | ICD-10-CM | POA: Diagnosis not present

## 2022-09-25 DIAGNOSIS — M6289 Other specified disorders of muscle: Secondary | ICD-10-CM | POA: Insufficient documentation

## 2022-09-25 DIAGNOSIS — R278 Other lack of coordination: Secondary | ICD-10-CM | POA: Insufficient documentation

## 2022-09-25 NOTE — Therapy (Signed)
OUTPATIENT PHYSICAL THERAPY FEMALE PELVIC TREATMENT/recert   Patient Name: Shannon [name] MRN: [id number] DOB:Feb 27, 1951, 72 y.o., female Today's Date: 09/25/2022   PT End of Session - 09/25/22 0934     Visit Number 14    Number of Visits 20    Date for PT Re-Evaluation 11/10/22   original POC: 09/13/22   Authorization Type IE: 04/17/22; RC/PN: 06/28/22; PN: 12/21    Progress Note Due on Visit 20    PT Start Time 0931    PT Stop Time 1012    PT Time Calculation (min) 41 min    Activity Tolerance Patient tolerated treatment well    Behavior During Therapy [facility] for tasks assessed/performed             Past Medical History:  Diagnosis Date   GERD (gastroesophageal reflux disease)    Hyperlipidemia    Osteoporosis    now has osteopenia   Thyroid disease    Past Surgical History:  Procedure Laterality Date   HAND SURGERY     40 yrs ago   Patient Active Problem List   Diagnosis Date Noted   LFTs abnormal 11/10/2021   Toe deformity 11/10/2021   Cystitis 07/31/2021   Stress incontinence 11/08/2020   Stress 11/08/2020   Skin exam, screening for cancer 11/08/2020   Erythematous rash 10/15/2018   Ganglion cyst of finger 09/24/2018   Eczema of scalp 04/03/2017   Hair loss 06/02/2014   Encounter for general adult medical examination with abnormal findings 11/24/2013   Hyperlipidemia 05/20/2013   Hypothyroidism 05/20/2013   Osteoporosis 05/20/2013   Thyroid nodule 05/20/2013   Carpal tunnel syndrome 02/10/2013   Elevated TSH 02/10/2013    PCP: Leone Haven, MD  REFERRING PROVIDER: Hollice Espy, MD  REFERRING DIAG: N39.3 (ICD-10-CM) - Stress incontinence, female   THERAPY DIAG:  Pelvic floor dysfunction  Muscle weakness (generalized)  Other lack of coordination  Rationale for Evaluation and Treatment: Rehabilitation  ONSET DATE: 1 year                                                                                                                                                                                            PRECAUTIONS: None  WEIGHT BEARING RESTRICTIONS: No  FALLS:  Has patient fallen in last 6 months? 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 Pt also had a series of UTI's last year which is where she started taking cranberry and probiotic pills. Pt  does hold her urine occasionally because she gets distracted or is busy at work. Some days are worse than others in terms of leakage.     PATIENT GOALS: Pt would like to not have urinary leakage and not have to wear incontinence pads    UROLOGICAL HISTORY Fluid intake: 2 cups coffee in morning, sparkling water, pure water, white wine at night (no more than 2)   Pain with urination: No Fully empty bladder: No Stream: Strong Urgency: No  Frequency: 6-7x  Just in case: Yes  Nocturia: 1-2x  Leakage: Coughing, Sneezing, Laughing, Exercise, Lifting, and Bending forward Pads: Yes Type: Lvl 3, Poise Amount: 2x/day, can be more if doing any of the above  Bladder control (0-10): 6/10    OBSTETRICAL HISTORY Vaginal deliveries: G2P1 Tearing: yes    GYNECOLOGICAL HISTORY Hysterectomy: no Pelvic Organ Prolapse: None Pain with exam: no Heaviness/pressure: no   SUBJECTIVE:  Pt reported she leaked in her poise 2 pad after walking seven miles. She has been performing proper peeing posture and that helps fully empty bladder. Pt has been performing exercises intermittently. Pt noted to cross LEs at ankles and knees while seated. She's been really busy at work.    PAIN:  Are you having pain? No NPRS scale: 0/10     OBJECTIVE:    COGNITION: Overall cognitive status: Within functional limits for tasks assessed     POSTURE:    TODAY'S TREATMENT  Neuromuscular Re-education: Access Code:  [email or web address] URL: [email or web address] Date: 09/25/2022 Prepared by: Geoffry Paradise  Exercises: - Supine Pelvic Floor Contraction  - 1-2 x daily - 7 x weekly - 1 sets - 10 reps and 5 reps of 10 sec.holds. Plus, progressed to [id number] reps of quick flicks with R and L knee in and outs. Pt able to perform with breath. -Standing wall angels x10 reps -supine, seated and standing pelvic tilts with pt finding neutral and tips to improve strain during work standing activities. - Dead Bug  - 1 x daily - 3 x weekly - 2-3 sets - 10 reps (taking rest breaks to improve breathing coordination) -Barre activities: 10 minutes of heel raises, squats in first and second position with and without heel raises, hip abd, hip ext, clams in sidelying, sidelying hip abd with foot forward in DF and hip IR, all activities performed with S for safety and cues for technique.. Intermittent UE support at counter. Not added to HEP. -Wall angels x10 reps-added to HEP. Cues and demo for proper technique and coordination with breath. S for safety.    Patient response to interventions: Pt stated she felt good after session and could really feel her core activating.  SELF CARE: Patient Education:  PT reviewed HEP and progressed as tolerated. Patient will benefit from further education in order to maximize compliance and understanding for long-term therapeutic gains.      ASSESSMENT:  Clinical Impression: Today's skilled session focused on progressing HEP, including stability in core and hips to decr. leakage. Cues required and then progressed to IND at end of session. Pt would continue to benefit from skilled therapeutic intervention every other week to address remaining deficits in IAP management, PFM coordination, PFM strength/endurance, and LE strength, in order to increase PLOF and improve overall QOL.  Pt would continue to benefit from skilled PT improve deficits listed above during work and all ADLs.     Objective Impairments: decreased coordination, decreased endurance, decreased strength, improper body mechanics, and pain.   Activity Limitations: carrying, lifting, bending,  squatting, continence, and toileting  Personal Factors: Age, Behavior pattern, Past/current experiences, Time since onset of injury/illness/exacerbation, and 1-2 comorbidities: osteoporosis, hypothyroidism  are also affecting patient's functional outcome.   Rehab Potential: Good  Clinical Decision Making: Evolving/moderate complexity  Evaluation Complexity: Moderate   GOALS: Goals reviewed with patient? Yes  SHORT TERM GOALS: Target date: 05/22/2022  Patient will demonstrate independence with HEP in order to maximize therapeutic gains and improve carryover from physical therapy sessions to ADLs in the home and community. Baseline: bladder irritants handout; (12/7): IND with HEP Goal status: MET   LONG TERM GOALS: Target date: 09/13/2022. All LTGs unmet will be carried over to new POC: 09/25/22.  Patient will score >/= 63 on FOTO Urinary Problem  in order to demonstrate improved IAP management, improved PFM coordination/strength, and overall QOL.  Baseline: 56; (12/7): 59 Goal status: IN PROGRESS (deferred)  2.  Patient will report less than 5 incidents of stress urinary incontinence over the course of 3 weeks while coughing/sneezing/laughing/physical activity/lifting/bending in order to demonstrate improved PFM coordination, strength, and function for improved overall QOL. Baseline: urinary leakage with all the above; (12/7): continues to have urinary leakage with sneezing/walking longer periods of time/coughing but significantly less urinary leakage "dribbling" Goal status: PARTIALLY MET  3.  Patient will report decreased reliance on protective undergarments as indicated by a 24 hour period to demonstrate improved bladder control and allow for increased participation in activities outside of the  home. Baseline: 2x/day or more, Lvl 3 Poise ; (12/7): 1x/day, Lvl 3 ; 2/20: lvl 2 and wears one a day. Daily leakage but less volume. Goal status: PARTIALLY MET  4.  Patient will demonstrate circumferential and sequential contraction of >3/5 MMT, > 5 sec hold x5 and 5 consecutive quick flicks with </= 10 min rest between testing bouts, and relaxation of the PFM coordinated with breath for improved management of intra-abdominal pressure and normal bowel and bladder function without the presence of pain nor incontinence in order to improve participation at home and in the community. Baseline: 4/5 MMT, no quick flicks tested; [id number]: external 4/5 with improved relaxation but strength decr. To 3/5 by 3-4 reps. Goal status: PARTIALLY MET  5.  Patient will demonstrate coordinated lengthening and relaxation of PFM with diaphragmatic inhalation in order to decrease spasm and allow for unrestricted elimination of urine/feces for improved overall QOL. Baseline: present but inconsistent; (12/7): present completely  Goal status: MET    PLAN: PT Frequency: 1x/week  PT Duration: 10 weeks  Planned Interventions: Therapeutic exercises, Therapeutic activity, Neuromuscular re-education, Balance training, Gait training, Patient/Family education, Self Care, Joint mobilization, Cryotherapy, Moist heat, scar mobilization, Taping, and Manual therapy  Plan For Next Session:  review progressed HEP, barre on [employer], continue with IAP management, bridging with one leg, single leg squat, SLS  Geoffry Paradise, PT,DPT 09/25/22 9:38 AM Phone: [phone] Fax: [phone]

## 2022-10-09 ENCOUNTER — Other Ambulatory Visit: Payer: Self-pay

## 2022-10-09 ENCOUNTER — Ambulatory Visit: Payer: PPO

## 2022-10-09 DIAGNOSIS — M6289 Other specified disorders of muscle: Secondary | ICD-10-CM | POA: Diagnosis not present

## 2022-10-09 DIAGNOSIS — M6281 Muscle weakness (generalized): Secondary | ICD-10-CM

## 2022-10-09 DIAGNOSIS — R278 Other lack of coordination: Secondary | ICD-10-CM

## 2022-10-09 NOTE — Therapy (Signed)
OUTPATIENT PHYSICAL THERAPY FEMALE PELVIC TREATMENT   Patient Name: Shannon [name] MRN: [id number] DOB:[birth year], 72 y.o., female Today's Date: 10/09/2022   PT End of Session - 10/09/22 0938     Visit Number 15    Number of Visits 20    Date for PT Re-Evaluation 11/10/22    Authorization Type IE: 04/17/22; RC/PN: 06/28/22; PN: 12/21    Progress Note Due on Visit 20    PT Start Time 0936    PT Stop Time 1014    PT Time Calculation (min) 38 min    Activity Tolerance Patient tolerated treatment well    Behavior During Therapy [facility] for tasks assessed/performed             Past Medical History:  Diagnosis Date   GERD (gastroesophageal reflux disease)    Hyperlipidemia    Osteoporosis    now has osteopenia   Thyroid disease    Past Surgical History:  Procedure Laterality Date   HAND SURGERY     40 yrs ago   Patient Active Problem List   Diagnosis Date Noted   LFTs abnormal 11/10/2021   Toe deformity 11/10/2021   Cystitis 07/31/2021   Stress incontinence 11/08/2020   Stress 11/08/2020   Skin exam, screening for cancer 11/08/2020   Erythematous rash 10/15/2018   Ganglion cyst of finger 09/24/2018   Eczema of scalp 04/03/2017   Hair loss 06/02/2014   Encounter for general adult medical examination with abnormal findings 11/24/2013   Hyperlipidemia 05/20/2013   Hypothyroidism 05/20/2013   Osteoporosis 05/20/2013   Thyroid nodule 05/20/2013   Carpal tunnel syndrome 02/10/2013   Elevated TSH 02/10/2013    PCP: Leone Haven, MD  REFERRING PROVIDER: Hollice Espy, MD  REFERRING DIAG: N39.3 (ICD-10-CM) - Stress incontinence, female   THERAPY DIAG:  Pelvic floor dysfunction  Muscle weakness (generalized)  Other lack of coordination  Rationale for Evaluation and Treatment: Rehabilitation  ONSET DATE: 1 year                                                                                                                                                                                            PRECAUTIONS: None  WEIGHT BEARING RESTRICTIONS: No  FALLS:  Has patient fallen in last 6 months? 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 Pt also had a series of UTI's last year which is where she started taking cranberry and probiotic pills. Pt does hold her urine  occasionally because she gets distracted or is busy at work. Some days are worse than others in terms of leakage.     PATIENT GOALS: Pt would like to not have urinary leakage and not have to wear incontinence pads    UROLOGICAL HISTORY Fluid intake: 2 cups coffee in morning, sparkling water, pure water, white wine at night (no more than 2)   Pain with urination: No Fully empty bladder: No Stream: Strong Urgency: No  Frequency: 6-7x  Just in case: Yes  Nocturia: 1-2x  Leakage: Coughing, Sneezing, Laughing, Exercise, Lifting, and Bending forward Pads: Yes Type: Lvl 3, Poise Amount: 2x/day, can be more if doing any of the above  Bladder control (0-10): 6/10    OBSTETRICAL HISTORY Vaginal deliveries: G2P1 Tearing: yes    GYNECOLOGICAL HISTORY Hysterectomy: no Pelvic Organ Prolapse: None Pain with exam: no Heaviness/pressure: no   SUBJECTIVE:  Pt reported she's having incr. Leakage this weekend after having intercourse and is on an antibiotic for oral procedure last week. She feels like she has some irritation and make on the brink of an UTI. No pain presently. She's having urgency with the irritation.    PAIN:  Are you having pain? No NPRS scale: 0/10     OBJECTIVE:    COGNITION: Overall cognitive status: Within functional limits for tasks assessed     POSTURE:    TODAY'S TREATMENT  Neuromuscular Re-education:   Access Code: [email or web address] URL:  [email or web address] Date: 10/09/2022 Prepared by: Geoffry Paradise  Exercises: -Diaphragmatic breathing x 5 reps. - Supine Pelvic Floor Contraction  - 1-2 x daily - 7 x weekly - 1 sets - 10 reps and 5 reps of 10 sec.holds.  - Dead Bug  - 1 x daily - 3 x weekly - 2-3 sets - 10 reps (taking rest breaks to improve breathing coordination) -Barre on [employer] app with S for safety and UE support as needed. Pt performed standing alternating hip marches c and s shoulder flexion to 90 degrees, hip extension, mini squats with B foot ER c and s heel raises, sustained second position squats, barre curl to target abs c and s UE reaching forward. Cues and demo for proper technique.     Patient response to interventions: Pt stated she felt good after session and could really feel her core activating.  SELF CARE: Patient Education:  PT reviewed HEP and progressed to barre activities. PT demonstrated how to modify barre activities as needed. PT discussed how body can build resistance to antibiotics and to check with MD as needed re: recurrent UTIs.  Patient will benefit from further education in order to maximize compliance and understanding for long-term therapeutic gains.      ASSESSMENT:  Clinical Impression: Today's skilled session focused on progressing HEP to barre activities. Pt progressed to requiring cues to performing new activities IND.  Pt would continue to benefit from skilled therapeutic intervention every other week to address remaining deficits in IAP management, PFM coordination, PFM strength/endurance, and LE strength, in order to increase PLOF and improve overall QOL.  Pt would continue to benefit from skilled PT improve deficits listed above during work and all ADLs.    Objective Impairments: decreased coordination, decreased endurance, decreased strength, improper body mechanics, and pain.   Activity Limitations: carrying, lifting, bending, squatting, continence, and  toileting  Personal Factors: Age, Behavior pattern, Past/current experiences, Time since onset of injury/illness/exacerbation, and 1-2 comorbidities: osteoporosis, hypothyroidism  are also affecting patient's functional outcome.   Rehab Potential: Good  Clinical Decision Making: Evolving/moderate complexity  Evaluation Complexity: Moderate   GOALS: Goals reviewed with patient? Yes  SHORT TERM GOALS: Target date: 05/22/2022  Patient will demonstrate independence with HEP in order to maximize therapeutic gains and improve carryover from physical therapy sessions to ADLs in the home and community. Baseline: bladder irritants handout; (12/7): IND with HEP Goal status: MET   LONG TERM GOALS: Target date: 09/13/2022. All LTGs unmet will be carried over to new POC: 10/23/22.  Patient will score >/= 63 on FOTO Urinary Problem  in order to demonstrate improved IAP management, improved PFM coordination/strength, and overall QOL.  Baseline: 56; (12/7): 59 Goal status: IN PROGRESS (deferred)  2.  Patient will report less than 5 incidents of stress urinary incontinence over the course of 3 weeks while coughing/sneezing/laughing/physical activity/lifting/bending in order to demonstrate improved PFM coordination, strength, and function for improved overall QOL. Baseline: urinary leakage with all the above; (12/7): continues to have urinary leakage with sneezing/walking longer periods of time/coughing but significantly less urinary leakage "dribbling" Goal status: PARTIALLY MET  3.  Patient will report decreased reliance on protective undergarments as indicated by a 24 hour period to demonstrate improved bladder control and allow for increased participation in activities outside of the home. Baseline: 2x/day or more, Lvl 3 Poise ; (12/7): 1x/day, Lvl 3 ; 2/20: lvl 2 and wears one a day. Daily leakage but less volume. Goal status: PARTIALLY MET  4.  Patient will demonstrate circumferential and  sequential contraction of >3/5 MMT, > 5 sec hold x5 and 5 consecutive quick flicks with </= 10 min rest between testing bouts, and relaxation of the PFM coordinated with breath for improved management of intra-abdominal pressure and normal bowel and bladder function without the presence of pain nor incontinence in order to improve participation at home and in the community. Baseline: 4/5 MMT, no quick flicks tested; [id number]: external 4/5 with improved relaxation but strength decr. To 3/5 by 3-4 reps. Goal status: PARTIALLY MET  5.  Patient will demonstrate coordinated lengthening and relaxation of PFM with diaphragmatic inhalation in order to decrease spasm and allow for unrestricted elimination of urine/feces for improved overall QOL. Baseline: present but inconsistent; (12/7): present completely  Goal status: MET    PLAN: PT Frequency: 1x/week  PT Duration: 10 weeks  Planned Interventions: Therapeutic exercises, Therapeutic activity, Neuromuscular re-education, Balance training, Gait training, Patient/Family education, Self Care, Joint mobilization, Cryotherapy, Moist heat, scar mobilization, Taping, and Manual therapy  Plan For Next Session:  review progressed HEP, continue with IAP management, bridging with one leg, single leg squat, SLS  Geoffry Paradise, PT,DPT 10/09/22 9:39 AM Phone: [phone] Fax: [phone]

## 2022-10-15 DIAGNOSIS — M9901 Segmental and somatic dysfunction of cervical region: Secondary | ICD-10-CM | POA: Diagnosis not present

## 2022-10-15 DIAGNOSIS — M5412 Radiculopathy, cervical region: Secondary | ICD-10-CM | POA: Diagnosis not present

## 2022-10-15 DIAGNOSIS — M9902 Segmental and somatic dysfunction of thoracic region: Secondary | ICD-10-CM | POA: Diagnosis not present

## 2022-10-15 DIAGNOSIS — M5033 Other cervical disc degeneration, cervicothoracic region: Secondary | ICD-10-CM | POA: Diagnosis not present

## 2022-10-18 ENCOUNTER — Other Ambulatory Visit: Payer: Self-pay | Admitting: Family Medicine

## 2022-10-18 DIAGNOSIS — E785 Hyperlipidemia, unspecified: Secondary | ICD-10-CM

## 2022-10-19 ENCOUNTER — Telehealth: Payer: PPO | Admitting: Physician Assistant

## 2022-10-19 DIAGNOSIS — R3989 Other symptoms and signs involving the genitourinary system: Secondary | ICD-10-CM | POA: Diagnosis not present

## 2022-10-19 MED ORDER — CIPROFLOXACIN HCL 500 MG PO TABS: 500.0000 mg | ORAL_TABLET | Freq: Two times a day (BID) | ORAL | 0 refills | Status: AC

## 2022-10-19 NOTE — Progress Notes (Signed)
E-Visit for Urinary Problems  We are sorry that you are not feeling well.  Here is how we plan to help!  Based on what you shared with me it looks like you most likely have a simple urinary tract infection.  A UTI (Urinary Tract Infection) is a bacterial infection of the bladder.  Most cases of urinary tract infections are simple to treat but a key part of your care is to encourage you to drink plenty of fluids and watch your symptoms carefully.  I have prescribed Cipro 500mg  Take 1 tablet twice daily for 3 days.  Your symptoms should gradually improve. Call us if the burning in your urine worsens, you develop worsening fever, back pain or pelvic pain or if your symptoms do not resolve after completing the antibiotic.  Urinary tract infections can be prevented by drinking plenty of water to keep your body hydrated.  Also be sure when you wipe, wipe from front to back and don't hold it in!  If possible, empty your bladder every 4 hours.  HOME CARE Drink plenty of fluids Compete the full course of the antibiotics even if the symptoms resolve Remember, when you need to go.go. Holding in your urine can increase the likelihood of getting a UTI! GET HELP RIGHT AWAY IF: You cannot urinate You get a high fever Worsening back pain occurs You see blood in your urine You feel sick to your stomach or throw up You feel like you are going to pass out  MAKE SURE YOU  Understand these instructions. Will watch your condition. Will get help right away if you are not doing well or get worse.   Thank you for choosing an e-visit.  Your e-visit answers were reviewed by a board certified advanced clinical practitioner to complete your personal care plan. Depending upon the condition, your plan could have included both over the counter or prescription medications.  Please review your pharmacy choice. Make sure the pharmacy is open so you can pick up prescription now. If there is a problem, you may  contact your provider through [employer] and have the prescription routed to another pharmacy.  Your safety is important to [place]. If you have drug allergies check your prescription carefully.   For the next 24 hours you can use MyChart to ask questions about today's visit, request a non-urgent call back, or ask for a work or school excuse. You will get an email in the next two days asking about your experience. I hope that your e-visit has been valuable and will speed your recovery.  I have spent 5 minutes in review of e-visit questionnaire, review and updating patient chart, medical decision making and response to patient.   Mar Daring, PA-C

## 2022-10-30 NOTE — Progress Notes (Unsigned)
10/31/2022 4:51 PM   Shannon [name] 03/30/1951 [id number]  Referring provider: Glori Luis, MD [address] [place],  [place] [address]  Urological history: 1. Stress incontinence -Contributing factors of age and vaginal atrophy -referred to PT  2. rUTI's -Contributing factors of age and vaginal atrophy -Documented urine cultures over the last year  08/15/2022 - <10,000  11/28/2021 - E.coli  -Vaginal estrogen cream, cranberry tablets and probiotics  No chief complaint on file.   HPI: Shannon [name] is a 72 y.o. female who presents today for possible UTI- burning for one day.   Of note, she was seen in urgent care in January and urinalysis had greater than 50 RBCs along with greater than 50 WBCs and her urine culture was negative from that visit.  UA ***   PMH: Past Medical History:  Diagnosis Date   GERD (gastroesophageal reflux disease)    Hyperlipidemia    Osteoporosis    now has osteopenia   Thyroid disease     Surgical History: Past Surgical History:  Procedure Laterality Date   HAND SURGERY     40 yrs ago    Home Medications:  Allergies as of 10/31/2022   No Known Allergies      Medication List        Accurate as of October 30, 2022  4:51 PM. If you have any questions, ask your nurse or doctor.          b complex vitamins capsule Take by mouth.   Calcium Carb-Cholecalciferol 600-10 MG-MCG Tabs   CENTRUM SILVER PO Take 1 tablet by mouth daily.   Cranberry Plus Vitamin C 4200-20-3 MG-MG-UNIT Caps Generic drug: Cranberry-Vitamin C-Vitamin E   D3 High Potency 125 MCG (5000 UT) capsule Generic drug: Cholecalciferol   estradiol 0.1 MG/GM vaginal cream Commonly known as: ESTRACE 1 gram intravaginally at bedtime daily for 2-3 weeks, then decrease to 3 times per week   levothyroxine 88 MCG tablet Commonly known as: SYNTHROID TAKE ONE TABLET BY MOUTH DAILY   PROBIOTIC-10 ULTIMATE PO Take by mouth.   simvastatin  20 MG tablet Commonly known as: ZOCOR TAKE ONE TABLET BY MOUTH EVERY EVENING        Allergies: No Known Allergies  Family History: Family History  Problem Relation Age of Onset   Breast cancer Mother 12   Heart disease Father    Cancer Father        Prostate   Diabetes Father        diagnosed later in life   Prostate cancer Father    Colon polyps Father 16   Parkinson's disease Father    Congestive Heart Failure Father    Asthma Brother    Colon cancer Neg Hx    Rectal cancer Neg Hx    Stomach cancer Neg Hx    Esophageal cancer Neg Hx     Social History:  reports that she quit smoking about 33 years ago. Her smoking use included cigarettes. She has never used smokeless tobacco. She reports current alcohol use of about 7.0 standard drinks of alcohol per week. She reports that she does not use drugs.  ROS: Pertinent ROS in HPI  Physical Exam: There were no vitals taken for this visit.  Constitutional:  Well nourished. Alert and oriented, No acute distress. HEENT: [place] AT, moist mucus membranes.  Trachea midline, no masses. Cardiovascular: No clubbing, cyanosis, or edema. Respiratory: Normal respiratory effort, no increased work of breathing. GU: No CVA  tenderness.  No bladder fullness or masses. Vulvovaginal atrophy w/ pallor, loss of rugae, introital retraction, excoriations.  Vulvar thinning, fusion of labia, clitoral hood retraction, prominent urethral meatus.   *** external genitalia, *** pubic hair distribution, no lesions.  Normal urethral meatus, no lesions, no prolapse, no discharge.   No urethral masses, tenderness and/or tenderness. No bladder fullness, tenderness or masses. *** vagina mucosa, *** estrogen effect, no discharge, no lesions, *** pelvic support, *** cystocele and *** rectocele noted.  No cervical motion tenderness.  Uterus is freely mobile and non-fixed.  No adnexal/parametria masses or tenderness noted.  Anus and perineum are without rashes or lesions.    ***  Neurologic: Grossly intact, no focal deficits, moving all 4 extremities. Psychiatric: Normal mood and affect.    Laboratory Data: Urinalysis *** I have reviewed the labs.   Pertinent Imaging: N/A  Assessment & Plan:  ***  1. Suspected UTI ***  2. Microscopic hematuria - we discussed that there are a number of causes that can be associated with blood in the urine, such as stones, UTI's, damage to the urinary tract and/or cancer.   Sometimes, we do not find a cause or source of the hematuria.    -we discussed that new guidelines place individuals into risk categories of low, intermediate and high risk categories.  These factors are based on age, smoking history and degree of blood in urine.    -we discussed that at this time, they are in the *** risk stratification   -we discussed that the recommended protocol for further work up are are CT urogram and cysto *** RUS and cysto **** repeat UA in three months  -we discussed that for a CT urogram a contrast material will be injected into a vein and that in rare instances, an allergic reaction can result and may even life threatening (1:100,000)  The patient denies any allergies to contrast***, iodine and/or seafood*** and is not taking metformin.***  - Her reproductive status is hysterectomy, postmenopausal, tubal ligation are unknown at this time.  We will obtain a serum pregnancy test today. ***  - we discussed that following the imaging study,  a cystoscopy is performed   -we discussed that a cystoscopy is a procedure that consists of passing a camera up their urethra after administering lidocaine to anesthetize and that after the procedure a minor amount of blood in the urine and/or burning which usually resolves in 24 to 48 hours may occur ***  -the patient had the opportunity to ask questions which were answered. Based upon this discussion, the patient is willing to proceed. Therefore, I've ordered: a CT Urogram and cystoscopy  ***  - The patient will return following all of the above for discussion of the results. *** - UA *** - Urine culture *** - BMP or recent creatinine ***    No follow-ups on file.  These notes generated with voice recognition software. I apologize for typographical errors.  Cloretta Ned  [facility] [address]  [address] [place], [place] [address] [phone]

## 2022-10-31 ENCOUNTER — Encounter: Payer: Self-pay | Admitting: Urology

## 2022-10-31 ENCOUNTER — Ambulatory Visit: Payer: PPO | Admitting: Urology

## 2022-10-31 VITALS — BP 120/84 | HR 74 | Ht 65.0 in | Wt 145.0 lb

## 2022-10-31 DIAGNOSIS — R3129 Other microscopic hematuria: Secondary | ICD-10-CM | POA: Diagnosis not present

## 2022-10-31 DIAGNOSIS — R3989 Other symptoms and signs involving the genitourinary system: Secondary | ICD-10-CM | POA: Diagnosis not present

## 2022-10-31 DIAGNOSIS — N952 Postmenopausal atrophic vaginitis: Secondary | ICD-10-CM | POA: Diagnosis not present

## 2022-10-31 LAB — URINALYSIS, COMPLETE

## 2022-10-31 LAB — MICROSCOPIC EXAMINATION

## 2022-10-31 MED ORDER — CIPROFLOXACIN HCL 250 MG PO TABS: 250.0000 mg | ORAL_TABLET | Freq: Two times a day (BID) | ORAL | 0 refills | Status: DC

## 2022-11-03 LAB — CULTURE, URINE COMPREHENSIVE

## 2022-11-05 ENCOUNTER — Encounter: Payer: Self-pay | Admitting: Family Medicine

## 2022-11-12 DIAGNOSIS — M5412 Radiculopathy, cervical region: Secondary | ICD-10-CM | POA: Diagnosis not present

## 2022-11-12 DIAGNOSIS — M9902 Segmental and somatic dysfunction of thoracic region: Secondary | ICD-10-CM | POA: Diagnosis not present

## 2022-11-12 DIAGNOSIS — M9901 Segmental and somatic dysfunction of cervical region: Secondary | ICD-10-CM | POA: Diagnosis not present

## 2022-11-12 DIAGNOSIS — M5033 Other cervical disc degeneration, cervicothoracic region: Secondary | ICD-10-CM | POA: Diagnosis not present

## 2022-11-15 ENCOUNTER — Ambulatory Visit
Admission: RE | Admit: 2022-11-15 | Discharge: 2022-11-15 | Disposition: A | Discharge status: Home / Self Care | Payer: PPO | Source: Ambulatory Visit | Attending: Urology | Admitting: Urology

## 2022-11-15 DIAGNOSIS — R3129 Other microscopic hematuria: Secondary | ICD-10-CM | POA: Insufficient documentation

## 2022-11-15 DIAGNOSIS — N281 Cyst of kidney, acquired: Secondary | ICD-10-CM | POA: Diagnosis not present

## 2022-11-15 MED ORDER — IOHEXOL 300 MG/ML  SOLN
100.0000 mL | Freq: Once | INTRAMUSCULAR | Status: AC | PRN
  Administered 2022-11-15: 100 mL via INTRAVENOUS

## 2022-11-21 ENCOUNTER — Encounter: Payer: Self-pay | Admitting: Family Medicine

## 2022-11-21 ENCOUNTER — Other Ambulatory Visit: Payer: Self-pay

## 2022-11-21 DIAGNOSIS — E785 Hyperlipidemia, unspecified: Secondary | ICD-10-CM

## 2022-11-21 DIAGNOSIS — E039 Hypothyroidism, unspecified: Secondary | ICD-10-CM

## 2022-11-21 NOTE — Telephone Encounter (Signed)
Received a refill request for Simvastatin 20mg .  Pt's last lipid panel 11/08/2021.  Called pt and she is also due for CPE.  Scheduled first available appointment for 11/28/22 @ 1:45pm.  Pt reports that she will not have enough medication to make it to this appointment.  Have pended a 30 day supply of medication with no refills for your signature if you are willing to fill.

## 2022-11-22 MED ORDER — SIMVASTATIN 20 MG PO TABS
20.0000 mg | ORAL_TABLET | Freq: Every evening | ORAL | 0 refills | Status: DC
Start: 2022-11-22 — End: 2022-12-19

## 2022-11-23 NOTE — Telephone Encounter (Signed)
I have placed orders for this. Please get her scheduled prior to her CPE next week. Thanks.

## 2022-11-23 NOTE — Telephone Encounter (Signed)
Patient is scheduled for 5/07 at 3:00

## 2022-11-26 ENCOUNTER — Other Ambulatory Visit (INDEPENDENT_AMBULATORY_CARE_PROVIDER_SITE_OTHER): Payer: PPO

## 2022-11-26 DIAGNOSIS — E039 Hypothyroidism, unspecified: Secondary | ICD-10-CM | POA: Diagnosis not present

## 2022-11-26 DIAGNOSIS — E785 Hyperlipidemia, unspecified: Secondary | ICD-10-CM | POA: Diagnosis not present

## 2022-11-26 LAB — COMPREHENSIVE METABOLIC PANEL
ALT: 25 U/L (ref 0–35)
AST: 30 U/L (ref 0–37)
Albumin: 4.3 g/dL (ref 3.5–5.2)
Alkaline Phosphatase: 43 U/L (ref 39–117)
BUN: 19 mg/dL (ref 6–23)
CO2: 26 mEq/L (ref 19–32)
Calcium: 9.5 mg/dL (ref 8.4–10.5)
Chloride: 102 mEq/L (ref 96–112)
Creatinine, Ser: 0.76 mg/dL (ref 0.40–1.20)
GFR: 78.63 mL/min (ref >60.00)
Glucose, Bld: 85 mg/dL (ref 70–99)
Potassium: 4.2 mEq/L (ref 3.5–5.1)
Sodium: 138 mEq/L (ref 135–145)
Total Bilirubin: 0.9 mg/dL (ref 0.2–1.2)
Total Protein: 7.2 g/dL (ref 6.0–8.3)

## 2022-11-26 LAB — LIPID PANEL
Cholesterol: 161 mg/dL (ref 0–200)
HDL: 64.8 mg/dL (ref >39.00)
LDL Cholesterol: 61 mg/dL (ref 0–99)
NonHDL: 95.95
Total CHOL/HDL Ratio: 2
Triglycerides: 175 mg/dL — ABNORMAL HIGH (ref 0.0–149.0)
VLDL: 35 mg/dL (ref 0.0–40.0)

## 2022-11-26 LAB — T4, FREE: Free T4: 0.92 ng/dL (ref 0.60–1.60)

## 2022-11-26 LAB — TSH: TSH: 2.25 u[IU]/mL (ref 0.35–5.50)

## 2022-11-27 ENCOUNTER — Other Ambulatory Visit: Payer: PPO

## 2022-11-28 ENCOUNTER — Ambulatory Visit: Payer: PPO | Admitting: Urology

## 2022-11-28 ENCOUNTER — Encounter: Payer: Self-pay | Admitting: Family Medicine

## 2022-11-28 ENCOUNTER — Ambulatory Visit (INDEPENDENT_AMBULATORY_CARE_PROVIDER_SITE_OTHER): Payer: PPO | Admitting: Family Medicine

## 2022-11-28 VITALS — BP 132/64 | HR 72 | Ht 65.0 in | Wt 145.0 lb

## 2022-11-28 VITALS — BP 122/76 | HR 70 | Temp 98.3°F | Ht 65.0 in | Wt 149.0 lb

## 2022-11-28 DIAGNOSIS — Z87440 Personal history of urinary (tract) infections: Secondary | ICD-10-CM

## 2022-11-28 DIAGNOSIS — R3129 Other microscopic hematuria: Secondary | ICD-10-CM | POA: Diagnosis not present

## 2022-11-28 DIAGNOSIS — Z0000 Encounter for general adult medical examination without abnormal findings: Secondary | ICD-10-CM | POA: Diagnosis not present

## 2022-11-28 DIAGNOSIS — N362 Urethral caruncle: Secondary | ICD-10-CM

## 2022-11-28 DIAGNOSIS — N952 Postmenopausal atrophic vaginitis: Secondary | ICD-10-CM

## 2022-11-28 DIAGNOSIS — N3090 Cystitis, unspecified without hematuria: Secondary | ICD-10-CM

## 2022-11-28 LAB — URINALYSIS, COMPLETE
Bilirubin, UA: NEGATIVE
Glucose, UA: NEGATIVE
Ketones, UA: NEGATIVE
Leukocytes,UA: NEGATIVE
Nitrite, UA: NEGATIVE
Protein,UA: NEGATIVE
RBC, UA: NEGATIVE
Specific Gravity, UA: 1.02 (ref 1.005–1.030)
Urobilinogen, Ur: 0.2 mg/dL (ref 0.2–1.0)
pH, UA: 5.5 (ref 5.0–7.5)

## 2022-11-28 LAB — MICROSCOPIC EXAMINATION

## 2022-11-28 MED ORDER — NITROFURANTOIN MONOHYD MACRO 100 MG PO CAPS: 100.0000 mg | ORAL_CAPSULE | Freq: Two times a day (BID) | ORAL | 0 refills | Status: DC

## 2022-11-28 NOTE — Progress Notes (Signed)
   11/28/22  CC:  Chief Complaint  Patient presents with   Cysto    HPI: 72 year old female who presents today for cystoscopy.  Has a personal history of vaginal atrophy, recurrent UTIs and microscopic hematuria.  She underwent a CT urogram which was unremarkable, incidental Bosniak 1 left renal cyst.  NED. A&Ox3.   No respiratory distress   Abd soft, NT, ND Normal external genitalia with patent urethral meatus  Cystoscopy Procedure Note  Patient identification was confirmed, informed consent was obtained, and patient was prepped using Betadine solution.  Lidocaine jelly was administered per urethral meatus.    Procedure: - Flexible cystoscope introduced, without any difficulty.   - Thorough search of the bladder revealed:    normal urethral meatus with small caruncle noted    normal urothelium    no stones    no ulcers     no tumors    no urethral polyps    no trabeculation Extrinsic compression from colon noted  - Ureteral orifices were normal in position and appearance.  Post-Procedure: - Patient tolerated the procedure well  Assessment/ Plan:  1. Microscopic hematuria Cystoscopy today is unremarkable along with CT urogram  Urinalysis today is negative - Urinalysis, Complete  2. Recurrent cystitis Patient reports she is no longer using her topical estrogen cream.  Strongly encouraged her to do so.  She was also shown where to apply the medication as this was unclear to her.  She requested a "just in case" prescription today.  She reports that she is going to [facility] as well as on a prolonged [place] cruise.  She is terrified she is getting get an infection.  We discussed that generally do not do this and if she is at [facility], would much prefer her to be seen and evaluated at urgent care to ensure that she receives the proper antibiotic especially given her multidrug-resistant organisms in the past.  I did agree to give her a one-time "just in case"  prescription for Macrobid for her cruise given that she will have limited access to medical care during this time.  She should only use this while traveling.  If she does have UTI symptoms outside of this time interval, she should be seen and evaluated in our office.  3. Vaginal atrophy As above  4. Urethral caruncle Incidental finding  5.  Constipation Extrinsic compression from stool ball noted at the time of cystoscopy.  She does report that she sometimes gets constipated.  Encouraged adequate hydration as well as consideration of a stool softener and/or MiraLAX to keep her regular.   Vanna Scotland, MD

## 2022-11-28 NOTE — Progress Notes (Signed)
Shannon Alar, MD Phone: [phone]  GLENDALE [name] is a 72 y.o. female who presents today for CPE.  Diet: needs to work on moderating her food intake, no soda or sweet tea Exercise: not as much walking recently with her bladder issues Pap smear: reports no history of abnormal paps, reports having them consistently until they were not recommended Colonoscopy: 12/01/19 7 year recall Mammogram: due in July Family history-  Colon cancer: no  Breast cancer: mother  Ovarian cancer: no Menses: postmenopausal Vaccines-   Flu: out of season  Tetanus: UTD  Shingles: UTD  COVID19: x6  Pneumonia: UTD Hep C Screening: UTd Tobacco use: no Alcohol use: 1-2/day Illicit Drug use: no Dentist: yes Ophthalmology: yes Has been following with urology for bladder issues. The recommended she start back on vaginal estrogen and treat constipation with fiber or miralax.   Active Ambulatory Problems    Diagnosis Date Noted   Hyperlipidemia 05/20/2013   Hypothyroidism 05/20/2013   Osteoporosis 05/20/2013   Thyroid nodule 05/20/2013   Routine general medical examination at a health care facility 11/24/2013   Hair loss 06/02/2014   Erythematous rash 10/15/2018   Carpal tunnel syndrome 02/10/2013   Eczema of scalp 04/03/2017   Elevated TSH 02/10/2013   Ganglion cyst of finger 09/24/2018   Stress incontinence 11/08/2020   Stress 11/08/2020   Skin exam, screening for cancer 11/08/2020   Cystitis 07/31/2021   LFTs abnormal 11/10/2021   Toe deformity 11/10/2021   Resolved Ambulatory Problems    Diagnosis Date Noted   Need for prophylactic vaccination and inoculation against influenza 05/20/2013   UTI (urinary tract infection) 11/09/2013   Screening for breast cancer 11/24/2013   Encounter for general adult medical examination with abnormal findings 09/10/2017   Past Medical History:  Diagnosis Date   GERD (gastroesophageal reflux disease)    Thyroid disease     Family History   Problem Relation Age of Onset   Breast cancer Mother 103   Heart disease Father    Cancer Father        Prostate   Diabetes Father        diagnosed later in life   Prostate cancer Father    Colon polyps Father 3   Parkinson's disease Father    Congestive Heart Failure Father    Asthma Brother    Colon cancer Neg Hx    Rectal cancer Neg Hx    Stomach cancer Neg Hx    Esophageal cancer Neg Hx     Social History   Socioeconomic History   Marital status: Married    Spouse name: Not on file   Number of children: Not on file   Years of education: Not on file   Highest education level: Bachelor's degree (e.g., BA, AB, BS)  Occupational History   Not on file  Tobacco Use   Smoking status: Former    Types: Cigarettes    Quit date: 01/18/1989    Years since quitting: 33.8   Smokeless tobacco: Never  Vaping Use   Vaping Use: Never used  Substance and Sexual Activity   Alcohol use: Yes    Alcohol/week: 7.0 standard drinks of alcohol    Types: 7 Glasses of wine per week   Drug use: No   Sexual activity: Yes    Partners: Male  Other Topics Concern   Not on file  Social History Narrative   Lives in [place] with husband. 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 Dog in home. Born in  [place], raised in [place]. [place]. Previously lived in [place].      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[place]   Social Determinants of [employer]: Low Risk  (11/24/2022)   Overall Financial Resource Strain (CARDIA)    Difficulty of Paying Living Expenses: Not hard at all  Food Insecurity: No Food Insecurity (11/24/2022)   Hunger Vital Sign    Worried About Running Out of Food in the Last Year: Never true    Ran Out of Food in the Last Year: Never true  Transportation Needs: No Transportation Needs (11/24/2022)   PRAPARE - Administrator, Civil Service  (Medical): No    Lack of Transportation (Non-Medical): No  Physical Activity: Insufficiently Active (11/24/2022)   Exercise Vital Sign    Days of Exercise per Week: 3 days    Minutes of Exercise per Session: 30 min  Stress: No Stress Concern Present (11/24/2022)   [employer] of Occupational Health - Occupational Stress Questionnaire    Feeling of Stress : Not at all  Social Connections: Moderately Integrated (11/24/2022)   Social Connection and Isolation Panel [NHANES]    Frequency of Communication with Friends and Family: More than three times a week    Frequency of Social Gatherings with Friends and Family: Three times a week    Attends Religious Services: 1 to 4 times per year    Active Member of Clubs or Organizations: No    Attends Banker Meetings: Not on file    Marital Status: Married  Catering manager Violence: Not At Risk (11/17/2021)   Humiliation, Afraid, Rape, and Kick questionnaire    Fear of Current or Ex-Partner: No    Emotionally Abused: No    Physically Abused: No    Sexually Abused: No    ROS  General:  Negative for nexplained weight loss, fever Skin: Negative for new or changing mole, sore that won't heal HEENT: Negative for trouble hearing, trouble seeing, ringing in ears, mouth sores, hoarseness, change in voice, dysphagia. CV:  Negative for chest pain, dyspnea, edema, palpitations Resp: Negative for cough, dyspnea, hemoptysis GI: Negative for nausea, vomiting, diarrhea, constipation, abdominal pain, melena, hematochezia. GU: Negative for dysuria, incontinence, urinary hesitance, hematuria, vaginal or penile discharge, polyuria, sexual difficulty, lumps in testicle or breasts MSK: Negative for muscle cramps or aches, joint pain or swelling Neuro: Negative for headaches, weakness, numbness, dizziness, passing out/fainting Psych: Negative for depression, anxiety, memory problems  Objective  Physical Exam Vitals:   11/28/22 1347  BP: 122/76   Pulse: 70  Temp: 98.3 F (36.8 C)  SpO2: 95%    BP Readings from Last 3 Encounters:  11/28/22 122/76  11/28/22 132/64  10/31/22 120/84   Wt Readings from Last 3 Encounters:  11/28/22 149 lb (67.6 kg)  11/28/22 145 lb (65.8 kg)  10/31/22 145 lb (65.8 kg)    Physical Exam Constitutional:      General: She is not in acute distress.    Appearance: She is not diaphoretic.  HENT:     Head: Normocephalic and atraumatic.  Cardiovascular:     Rate and Rhythm: Normal rate and regular rhythm.     Heart sounds: Normal heart sounds.  Pulmonary:     Effort: Pulmonary effort is  normal.     Breath sounds: Normal breath sounds.  Abdominal:     General: Bowel sounds are normal. There is no distension.     Palpations: Abdomen is soft.     Tenderness: There is no abdominal tenderness.  Musculoskeletal:     Right lower leg: No edema.     Left lower leg: No edema.  Lymphadenopathy:     Cervical: No cervical adenopathy.  Skin:    General: Skin is warm and dry.  Neurological:     Mental Status: She is alert.  Psychiatric:        Mood and Affect: Mood normal.      Assessment/Plan:   Routine general medical examination at a health care facility Assessment & Plan: Physical exam completed.  Encouraged getting back to healthy diet and exercise.  Patient will call to schedule her mammogram.  Advised to contact us for any postmenopausal bleeding.  I encouraged her to get another COVID vaccination.  Encouraged reduction in alcohol intake to no more than 1/day.  Lab work reviewed with patient.     Return in about 1 year (around 11/28/2023) for physical.   Shannon Alar, MD [facility] - [facility]

## 2022-11-28 NOTE — Patient Instructions (Signed)
A urethral caruncle is a benign outgrowth at the urethral meatus (urethral opening). They occur most commonly in postmenopausal women. Urethral caruncles occur when the outermost part of the urethra everts or turns out. When the mucosa is circumferentially everted, meaning the growth encompasses the entire diameter of the urethra, rather than just a segment of the urethra, the lesion is a urethral prolapse.  Urethral caruncles are often asymptomatic and found on pelvic exam.  Symptoms may include pain, light bleeding, painful urination, or impeded flow of urine. Women may also notice a bump at the urethral meatus.  In asymptomatic patients, treatment is often not necessary. For symptomatic patients, treatment includes vaginal estrogen cream, anti-inflammatory medications such as Motrin, and warm sitz baths.  Surgical excision is recommended for larger, symptomatic caruncles that do not respond to more conservative treatment.  

## 2022-11-28 NOTE — Assessment & Plan Note (Signed)
Physical exam completed.  Encouraged getting back to healthy diet and exercise.  Patient will call to schedule her mammogram.  Advised to contact us for any postmenopausal bleeding.  I encouraged her to get another COVID vaccination.  Encouraged reduction in alcohol intake to no more than 1/day.  Lab work reviewed with patient.

## 2022-11-30 ENCOUNTER — Encounter: Payer: Self-pay | Admitting: Family Medicine

## 2022-11-30 ENCOUNTER — Other Ambulatory Visit: Payer: Self-pay

## 2022-11-30 DIAGNOSIS — Z1239 Encounter for other screening for malignant neoplasm of breast: Secondary | ICD-10-CM

## 2022-12-18 DIAGNOSIS — M9902 Segmental and somatic dysfunction of thoracic region: Secondary | ICD-10-CM | POA: Diagnosis not present

## 2022-12-18 DIAGNOSIS — M9901 Segmental and somatic dysfunction of cervical region: Secondary | ICD-10-CM | POA: Diagnosis not present

## 2022-12-18 DIAGNOSIS — M5033 Other cervical disc degeneration, cervicothoracic region: Secondary | ICD-10-CM | POA: Diagnosis not present

## 2022-12-18 DIAGNOSIS — M5412 Radiculopathy, cervical region: Secondary | ICD-10-CM | POA: Diagnosis not present

## 2022-12-19 ENCOUNTER — Other Ambulatory Visit: Payer: Self-pay | Admitting: Family Medicine

## 2022-12-19 ENCOUNTER — Other Ambulatory Visit: Payer: Self-pay | Admitting: Internal Medicine

## 2022-12-19 DIAGNOSIS — E039 Hypothyroidism, unspecified: Secondary | ICD-10-CM

## 2022-12-19 DIAGNOSIS — E785 Hyperlipidemia, unspecified: Secondary | ICD-10-CM

## 2022-12-25 ENCOUNTER — Other Ambulatory Visit: Payer: PPO | Admitting: Urology

## 2022-12-27 DIAGNOSIS — M9902 Segmental and somatic dysfunction of thoracic region: Secondary | ICD-10-CM | POA: Diagnosis not present

## 2022-12-27 DIAGNOSIS — M5033 Other cervical disc degeneration, cervicothoracic region: Secondary | ICD-10-CM | POA: Diagnosis not present

## 2022-12-27 DIAGNOSIS — M9901 Segmental and somatic dysfunction of cervical region: Secondary | ICD-10-CM | POA: Diagnosis not present

## 2022-12-27 DIAGNOSIS — M5412 Radiculopathy, cervical region: Secondary | ICD-10-CM | POA: Diagnosis not present

## 2023-01-10 IMAGING — MG MM DIGITAL SCREENING BILAT W/ TOMO AND CAD
8 series · 8 of 24 positions shown · non-contrast
Comparison: Previous exam(s).

CLINICAL DATA: Screening.

EXAM:
DIGITAL SCREENING BILATERAL MAMMOGRAM WITH TOMOSYNTHESIS AND CAD
TECHNIQUE: Bilateral screening digital craniocaudal and mediolateral oblique
mammograms were obtained. Bilateral screening digital breast
tomosynthesis was performed. The images were evaluated with
computer-aided detection.

[L MLO synth-2D]
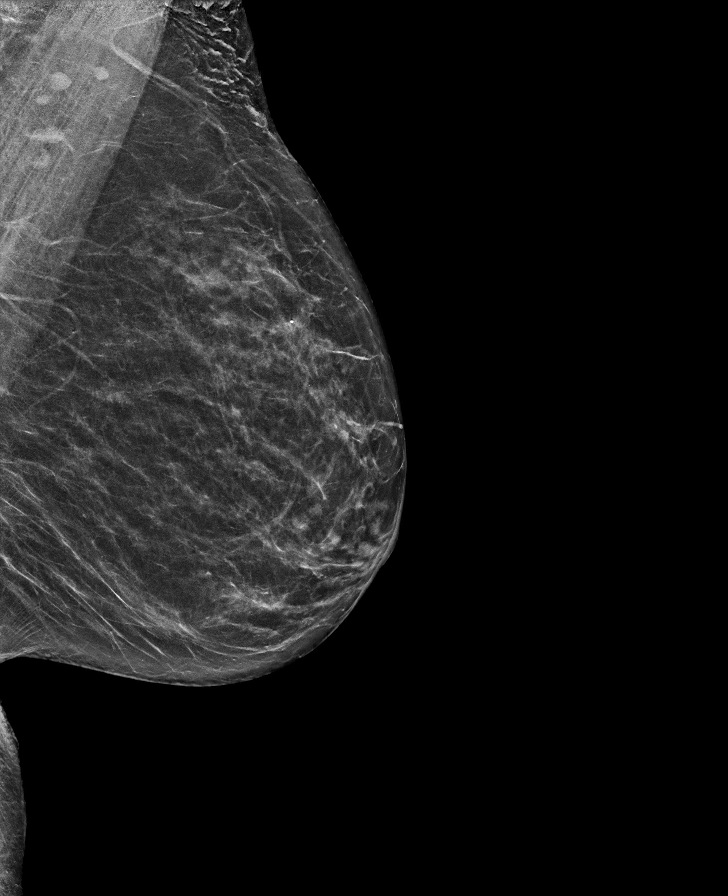

[L CC synth-2D]
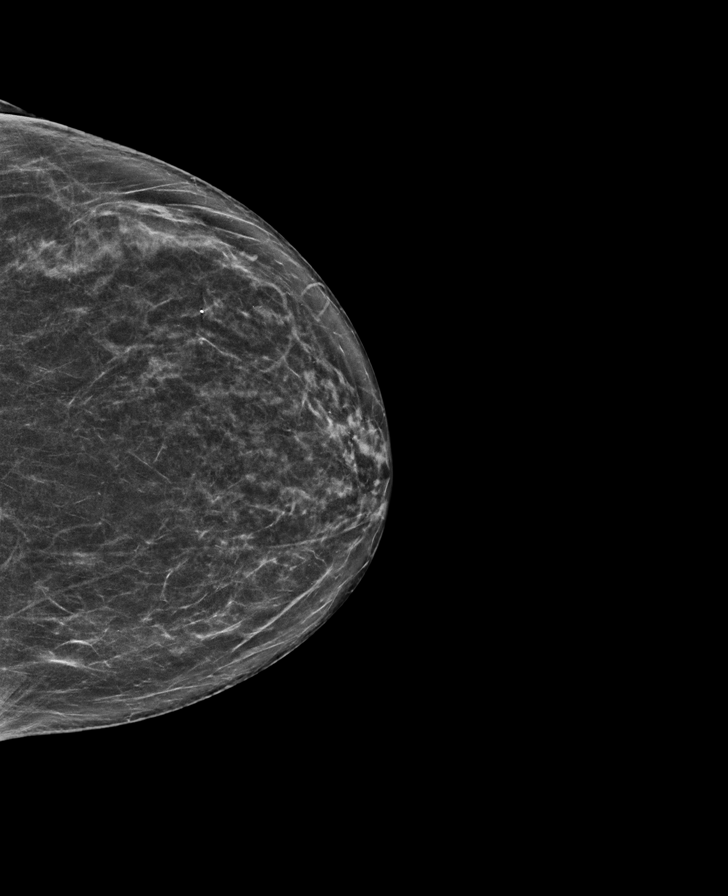

[R MLO synth-2D]
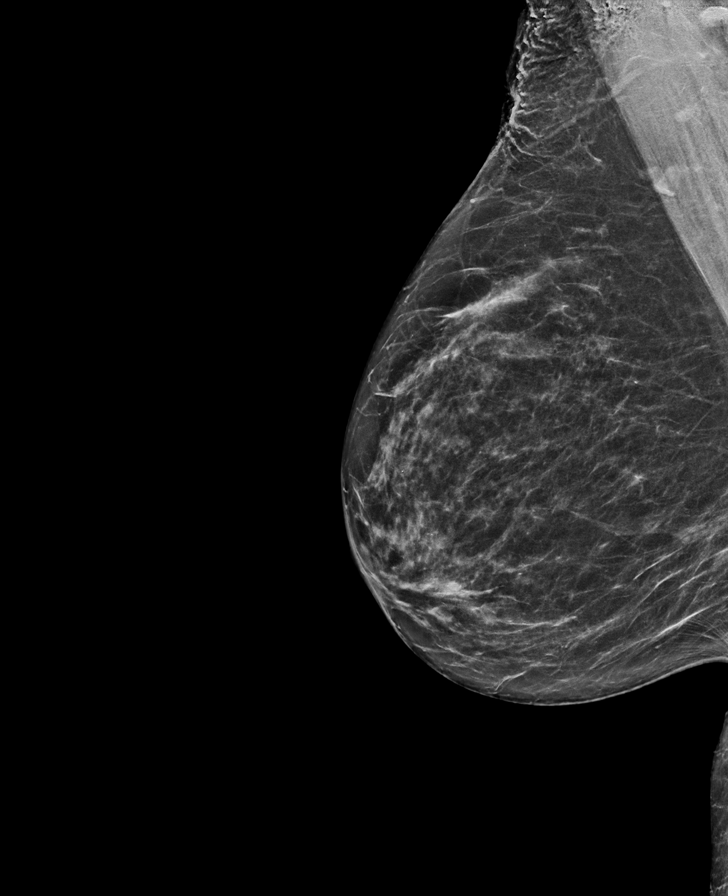

[R CC synth-2D]
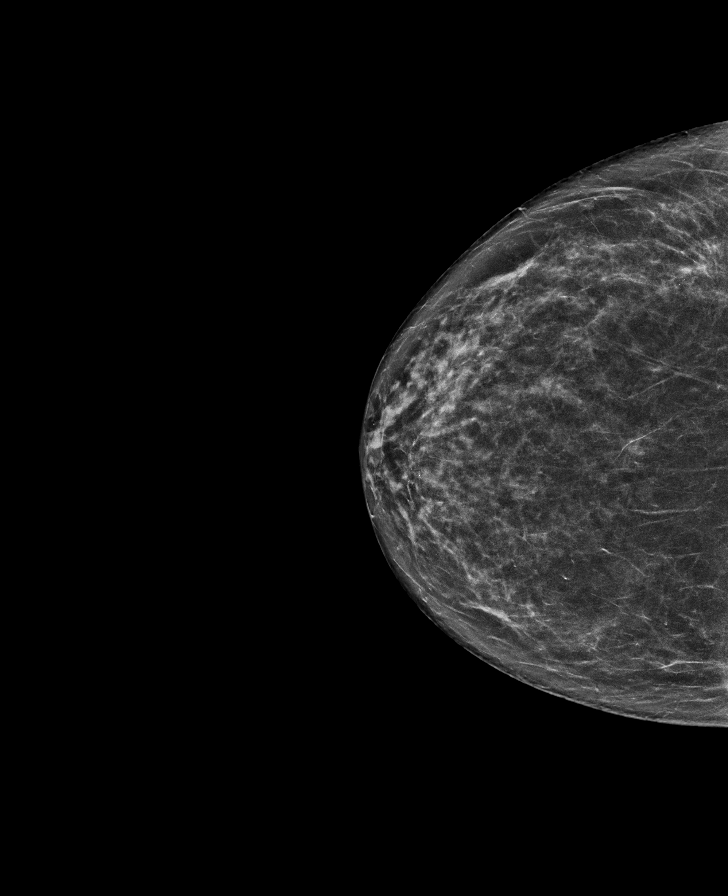

[L MLO tomo · tomo slice 34/67.0]
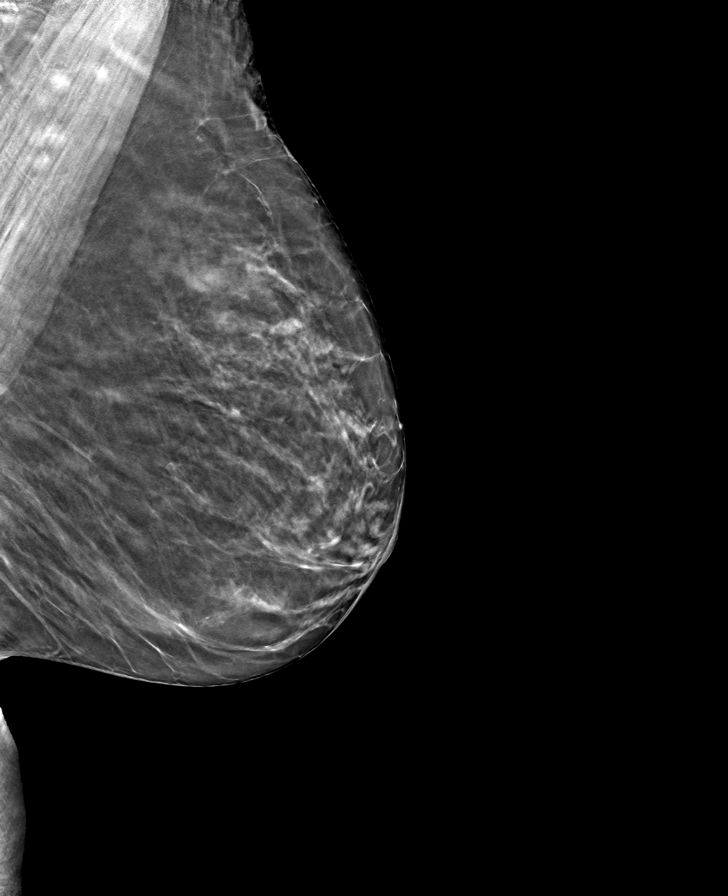

[L CC tomo · tomo slice 33/65.0]
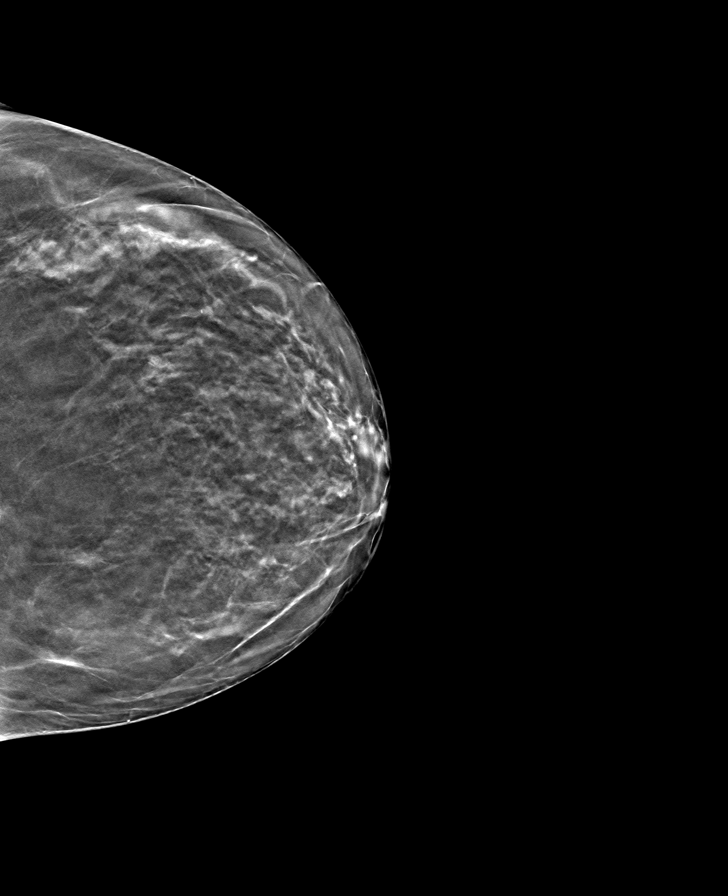

[R MLO tomo · tomo slice 35/68.0]
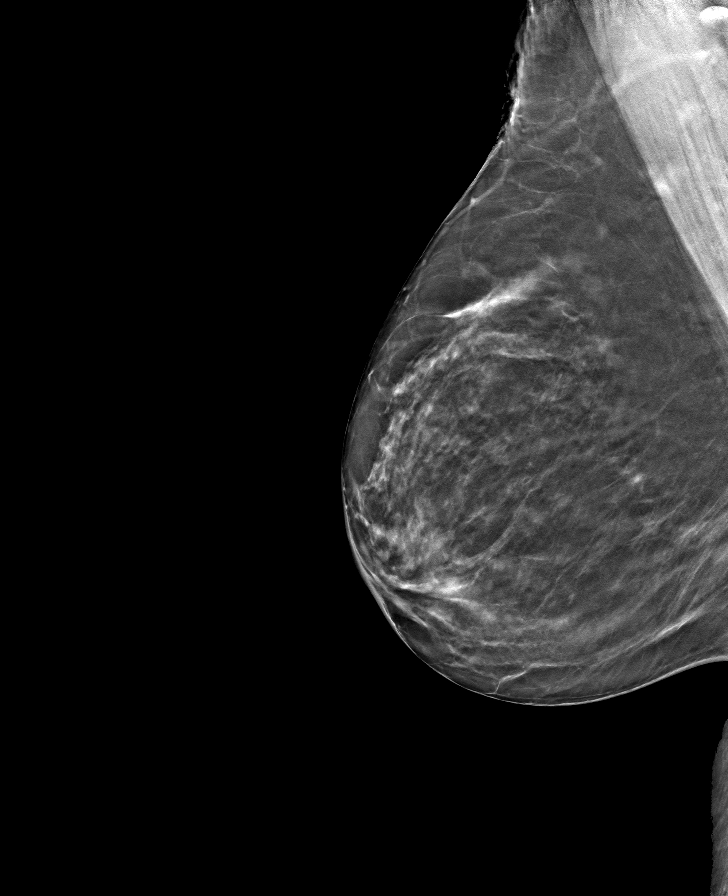

[R CC tomo · tomo slice 33/66.0]
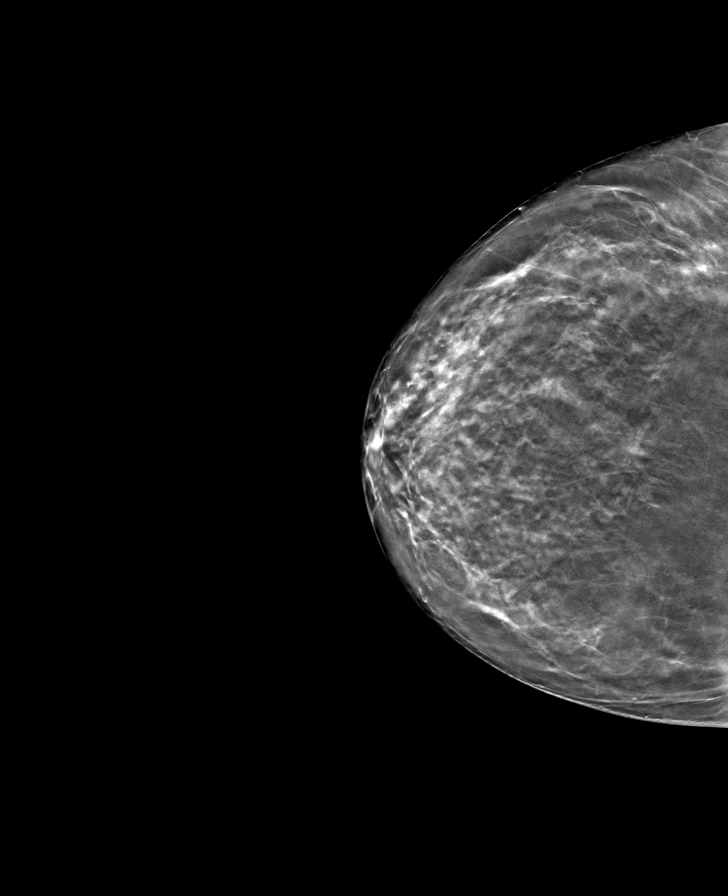

[8 of 24 positions shown; findings below may reference images not displayed]

ACR Breast Density Category b: There are scattered areas of
fibroglandular density.
FINDINGS: There are no findings suspicious for malignancy.
IMPRESSION: No mammographic evidence of malignancy. A result letter of this
screening mammogram will be mailed directly to the patient.

RECOMMENDATION:
Screening mammogram in one year. (Code:51-O-LD2)

BI-RADS CATEGORY  1: Negative.

## 2023-01-16 DIAGNOSIS — M5412 Radiculopathy, cervical region: Secondary | ICD-10-CM | POA: Diagnosis not present

## 2023-01-16 DIAGNOSIS — M5033 Other cervical disc degeneration, cervicothoracic region: Secondary | ICD-10-CM | POA: Diagnosis not present

## 2023-01-16 DIAGNOSIS — M9902 Segmental and somatic dysfunction of thoracic region: Secondary | ICD-10-CM | POA: Diagnosis not present

## 2023-01-16 DIAGNOSIS — M9901 Segmental and somatic dysfunction of cervical region: Secondary | ICD-10-CM | POA: Diagnosis not present

## 2023-01-29 ENCOUNTER — Ambulatory Visit (INDEPENDENT_AMBULATORY_CARE_PROVIDER_SITE_OTHER): Payer: PPO

## 2023-01-29 VITALS — Ht 65.0 in | Wt 145.0 lb

## 2023-01-29 DIAGNOSIS — Z0000 Encounter for general adult medical examination without abnormal findings: Secondary | ICD-10-CM

## 2023-01-29 NOTE — Progress Notes (Signed)
Subjective:   Shannon [name] is a 72 y.o. female who presents for Medicare Annual (Subsequent) preventive examination.  Visit Complete: Virtual  I connected with  [name] on 01/29/23 by a audio enabled telemedicine application and verified that I am speaking with the correct person using two identifiers.  Patient Location: Home  Provider Location: Office/Clinic  I discussed the limitations of evaluation and management by telemedicine. The patient expressed understanding and agreed to proceed.  Patient Medicare AWV questionnaire was completed by the patient on 10/26/22; I have confirmed that all information answered by patient is correct and no changes since this date.  Review of Systems     Cardiac Risk Factors include: advanced age (>5men, >32 women)     Objective:    Today's Vitals   01/29/23 0915  Weight: 145 lb (65.8 kg)  Height: 5\' 5"  (1.651 m)   Body mass index is 24.13 kg/m.     01/29/2023    9:26 AM 04/17/2022    8:49 AM 11/17/2021    3:39 PM 02/15/2020    1:43 PM  Advanced Directives  Does Patient Have a Medical Advance Directive? Yes Yes Yes No  Type of Estate agent of [place];Living will Healthcare Power of [place];Living will Healthcare Power of [place];Living will   Does patient want to make changes to medical advance directive?   No - Patient declined   Copy of Healthcare Power of Attorney in Chart?   No - copy requested   Would patient like information on creating a medical advance directive?    No - Patient declined    Current Medications (verified) Outpatient Encounter Medications as of 01/29/2023  Medication Sig   b complex vitamins capsule Take by mouth.   Calcium Carb-Cholecalciferol 600-10 MG-MCG TABS    Cholecalciferol (D3 HIGH POTENCY) 125 MCG (5000 UT) capsule    Cranberry-Vitamin C-Vitamin E (CRANBERRY PLUS VITAMIN C) 4200-20-3 MG-MG-UNIT CAPS    estradiol (ESTRACE) 0.1 MG/GM vaginal cream 1 gram intravaginally  at bedtime daily for 2-3 weeks, then decrease to 3 times per week   levothyroxine (SYNTHROID) 88 MCG tablet TAKE 1 TABLET BY MOUTH DAILY   Multiple Vitamins-Minerals (CENTRUM SILVER PO) Take 1 tablet by mouth daily.   nitrofurantoin, macrocrystal-monohydrate, (MACROBID) 100 MG capsule Take 1 capsule (100 mg total) by mouth 2 (two) times daily.   Probiotic Product (PROBIOTIC-10 ULTIMATE PO) Take by mouth.   simvastatin (ZOCOR) 20 MG tablet TAKE ONE TABLET BY MOUTH EVERY EVENING   No facility-administered encounter medications on file as of 01/29/2023.    Allergies (verified) Patient has no known allergies.   History: Past Medical History:  Diagnosis Date   GERD (gastroesophageal reflux disease)    Hyperlipidemia    Osteoporosis    now has osteopenia   Thyroid disease    Past Surgical History:  Procedure Laterality Date   HAND SURGERY     40 yrs ago   Family History  Problem Relation Age of Onset   Breast cancer Mother 62   Heart disease Father    Cancer Father        Prostate   Diabetes Father        diagnosed later in life   Prostate cancer Father    Colon polyps Father 60   Parkinson's disease Father    Congestive Heart Failure Father    Asthma Brother    Colon cancer Neg Hx    Rectal cancer Neg Hx    Stomach cancer  Neg Hx    Esophageal cancer Neg Hx    Social History   Socioeconomic History   Marital status: Married    Spouse name: Not on file   Number of children: Not on file   Years of education: Not on file   Highest education level: Bachelor's degree (e.g., BA, AB, BS)  Occupational History   Not on file  Tobacco Use   Smoking status: Former    Types: Cigarettes    Quit date: 01/18/1989    Years since quitting: 34.0   Smokeless tobacco: Never  Vaping Use   Vaping Use: Never used  Substance and Sexual Activity   Alcohol use: Yes    Alcohol/week: 7.0 standard drinks of alcohol    Types: 7 Glasses of wine per week   Drug use: No   Sexual activity:  Yes    Partners: Male  Other Topics Concern   Not on file  Social History Narrative   Lives in [place] with husband. 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 Dog in home. Born in [place], raised in [place]. [place]. Previously lived in [place].      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[place]   Social Determinants of [employer]: Low Risk  (11/24/2022)   Overall Financial Resource Strain (CARDIA)    Difficulty of Paying Living Expenses: Not hard at all  Food Insecurity: No Food Insecurity (01/29/2023)   Hunger Vital Sign    Worried About Running Out of Food in the Last Year: Never true    Ran Out of Food in the Last Year: Never true  Transportation Needs: No Transportation Needs (11/24/2022)   PRAPARE - Administrator, Civil Service (Medical): No    Lack of Transportation (Non-Medical): No  Physical Activity: Insufficiently Active (11/24/2022)   Exercise Vital Sign    Days of Exercise per Week: 3 days    Minutes of Exercise per Session: 30 min  Stress: No Stress Concern Present (11/24/2022)   [employer] of Occupational Health - Occupational Stress Questionnaire    Feeling of Stress : Not at all  Social Connections: Moderately Integrated (01/25/2023)   Social Connection and Isolation Panel [NHANES]    Frequency of Communication with Friends and Family: More than three times a week    Frequency of Social Gatherings with Friends and Family: Three times a week    Attends Religious Services: 1 to 4 times per year    Active Member of Clubs or Organizations: No    Attends Engineer, structural: Not on file    Marital Status: Married    Tobacco Counseling Counseling given: Not Answered   Clinical Intake:  Pre-visit preparation completed: Yes  Pain : No/denies pain     BMI - recorded: 24.79 Nutritional Status: BMI of 19-24   Normal Diabetes: No  How often do you need to have someone help you when you read instructions, pamphlets, or other written materials from your doctor or pharmacy?: (P) 1 - Never What is the last grade level you completed in school?: BS degree  Interpreter Needed?: No  Information entered by :: Taniyah Ballow   Activities of Daily Living    01/25/2023    8:48 AM  In your present state of health,  do you have any difficulty performing the following activities:  Hearing? 0  Vision? 0  Difficulty concentrating or making decisions? 0  Walking or climbing stairs? 0  Dressing or bathing? 0  Doing errands, shopping? 0  Preparing Food and eating ? N  Using the Toilet? N  In the past six months, have you accidently leaked urine? Y  Do you have problems with loss of bowel control? N  Managing your Medications? N  Managing your Finances? N  Housekeeping or managing your Housekeeping? N    Patient Care Team: Glori Luis, MD as PCP - General (Family Medicine)  Indicate any recent Medical Services you may have received from other than [facility] providers in the past year (date may be approximate).     Assessment:   This is a routine wellness examination for Shannon [name].  Hearing/Vision screen Hearing Screening - Comments:: No concerns  Dietary issues and exercise activities discussed:     Goals Addressed               This Visit's Progress     Maintain Healthy Lifestyle (pt-stated)   On track     Stay active Healthy diet      Depression Screen    01/29/2023    9:30 AM 11/28/2022    1:49 PM 11/17/2021    3:38 PM 11/10/2021    9:05 AM 07/31/2021   10:51 AM 11/08/2020   10:29 AM 02/15/2020    1:44 PM  PHQ 2/9 Scores  PHQ - 2 Score 0 0 0 0 0 0 0  PHQ- 9 Score 1 0         Fall Risk    01/25/2023    8:48 AM 11/28/2022    1:49 PM 11/17/2021    3:43 PM 11/10/2021    9:05 AM 07/31/2021   10:51 AM  Fall Risk   Falls in the past year? 0 0 0 1 0  Number falls in past yr: 0 0 0 0    Injury with Fall? 0 0  0   Risk for fall due to : No Fall Risks No Fall Risks  No Fall Risks No Fall Risks  Follow up Falls prevention discussed Falls evaluation completed Falls evaluation completed Falls evaluation completed Falls evaluation completed    MEDICARE RISK AT HOME:   TIMED UP AND GO:  Was the test performed?  No    Cognitive Function:    02/15/2020    1:54 PM  MMSE - Mini Mental State Exam  Not completed: Unable to complete        01/29/2023    9:28 AM  6CIT Screen  What Year? 0 points  What month? 0 points  What time? 0 points  Count back from 20 0 points  Months in reverse 0 points  Repeat phrase 2 points  Total Score 2 points    Immunizations Immunization History  Administered Date(s) Administered   Influenza Split 04/09/2022   Influenza, High Dose Seasonal PF 05/03/2018   Influenza,inj,Quad PF,6+ Mos 05/20/2013, 06/02/2014   Influenza-Unspecified 05/20/2016, 06/28/2017, 04/17/2019, 05/08/2020, 05/09/2021   Moderna Covid Bivalent Peds Booster(61mo Thru 79yrs) 05/13/2022   PFIZER(Purple Top)SARS-COV-2 Vaccination 08/12/2019, 09/02/2019, 03/18/2020, 05/09/2021, 11/29/2022   Pfizer Covid-19 Vaccine Bivalent Booster 39yrs & up 11/20/2021   Pneumococcal Conjugate-13 09/10/2017   Pneumococcal Polysaccharide-23 10/13/2018   Td 11/20/2021   Tdap 05/21/2011   Zoster Recombinant(Shingrix) 09/10/2017, 06/06/2018   Zoster, Live 05/20/2012, 08/13/2012    TDAP status: Up to date  Flu Vaccine status: Up to date  Pneumococcal vaccine status: Up to date  Covid-19 vaccine status: Completed vaccines  Qualifies for Shingles Vaccine? Yes   Zostavax completed Yes   Shingrix Completed?: Yes  Screening Tests Health Maintenance  Topic Date Due   COVID-19 Vaccine (8 - 2023-24 season) 01/24/2023   MAMMOGRAM  01/30/2023   INFLUENZA VACCINE  02/21/2023   Medicare Annual Wellness (AWV)  01/29/2024   Colonoscopy  12/01/2026   DTaP/Tdap/Td (3 - Td or Tdap)  11/21/2031   Pneumonia Vaccine 60+ Years old  Completed   DEXA SCAN  Completed   Hepatitis C Screening  Completed   Zoster Vaccines- Shingrix  Completed   HPV VACCINES  Aged Out    Health Maintenance  Health Maintenance Due  Topic Date Due   COVID-19 Vaccine (8 - 2023-24 season) 01/24/2023    Colorectal cancer screening: Type of screening: Colonoscopy. Completed 12/01/19. Repeat every 7 years  Mammogram status: Completed 01/29/22. Repeat every year  Bone Density status: Completed 01/29/22. Results reflect: Bone density results: OSTEOPENIA. Repeat every 2 years.  Lung Cancer Screening: (Low Dose CT Chest recommended if Age 74-80 years, 20 pack-year currently smoking OR have quit w/in 15years.) does not qualify.   Lung Cancer Screening Referral: n/a  Additional Screening:  Hepatitis C Screening: does qualify; Completed 01/23/16  Vision Screening: Recommended annual ophthalmology exams for early detection of glaucoma and other disorders of the eye. Is the patient up to date with their annual eye exam?  No  Who is the provider or what is the name of the office in which the patient attends annual eye exams? Dr. Alvester Morin  If pt is not established with a provider, would they like to be referred to a provider to establish care? No .   Dental Screening: Recommended annual dental exams for proper oral hygiene   Community Resource Referral / Chronic Care Management: CRR required this visit?  No   CCM required this visit?  No     Plan:     I have personally reviewed and noted the following in the patient's chart:   Medical and social history Use of alcohol, tobacco or illicit drugs  Current medications and supplements including opioid prescriptions. Patient is not currently taking opioid prescriptions. Functional ability and status Nutritional status Physical activity Advanced directives List of other physicians Hospitalizations, surgeries, and ER visits in previous 12  months Vitals Screenings to include cognitive, depression, and falls Referrals and appointments  In addition, I have reviewed and discussed with patient certain preventive protocols, quality metrics, and best practice recommendations. A written personalized care plan for preventive services as well as general preventive health recommendations were provided to patient.     Jayson Waterhouse  Arna Medici, CMA   01/29/2023   After Visit Summary: (MyChart) Due to this being a telephonic visit, the after visit summary with patients personalized plan was offered to patient via MyChart   Nurse Notes: NEEDS EYE EXAM DONE

## 2023-01-29 NOTE — Patient Instructions (Signed)
Shannon [name] , Thank you for taking time to come for your Medicare Wellness Visit. I appreciate your ongoing commitment to your health goals. Please review the following plan we discussed and let me know if I can assist you in the future.   These are the goals we discussed:  Goals       Maintain Healthy Lifestyle (pt-stated)      Stay active Healthy diet        This is a list of the screening recommended for you and due dates:  Health Maintenance  Topic Date Due   COVID-19 Vaccine (8 - 2023-24 season) 01/24/2023   Mammogram  01/30/2023   Flu Shot  02/21/2023   Medicare Annual Wellness Visit  01/29/2024   Colon Cancer Screening  12/01/2026   DTaP/Tdap/Td vaccine (3 - Td or Tdap) 11/21/2031   Pneumonia Vaccine  Completed   DEXA scan (bone density measurement)  Completed   Hepatitis C Screening  Completed   Zoster (Shingles) Vaccine  Completed   HPV Vaccine  Aged Out     Health Maintenance After Age 2 After age 63, you are at a higher risk for certain long-term diseases and infections as well as injuries from falls. Falls are a major cause of broken bones and head injuries in people who are older than age 66. Getting regular preventive care can help to keep you healthy and well. Preventive care includes getting regular testing and making lifestyle changes as recommended by your health care provider. Talk with your health care provider about: Which screenings and tests you should have. A screening is a test that checks for a disease when you have no symptoms. A diet and exercise plan that is right for you. What should I know about screenings and tests to prevent falls? Screening and testing are the best ways to find a health problem early. Early diagnosis and treatment give you the best chance of managing medical conditions that are common after age 68. Certain conditions and lifestyle choices may make you more likely to have a fall. Your health care provider may recommend: Regular  vision checks. Poor vision and conditions such as cataracts can make you more likely to have a fall. If you wear glasses, make sure to get your prescription updated if your vision changes. Medicine review. Work with your health care provider to regularly review all of the medicines you are taking, including over-the-counter medicines. Ask your health care provider about any side effects that may make you more likely to have a fall. Tell your health care provider if any medicines that you take make you feel dizzy or sleepy. Strength and balance checks. Your health care provider may recommend certain tests to check your strength and balance while standing, walking, or changing positions. Foot health exam. Foot pain and numbness, as well as not wearing proper footwear, can make you more likely to have a fall. Screenings, including: Osteoporosis screening. Osteoporosis is a condition that causes the bones to get weaker and break more easily. Blood pressure screening. Blood pressure changes and medicines to control blood pressure can make you feel dizzy. Depression screening. You may be more likely to have a fall if you have a fear of falling, feel depressed, or feel unable to do activities that you used to do. Alcohol use screening. Using too much alcohol can affect your balance and may make you more likely to have a fall. Follow these instructions at home: Lifestyle Do not drink alcohol if: Your health  care provider tells you not to drink. If you drink alcohol: Limit how much you have to: 0-1 drink a day for women. 0-2 drinks a day for men. Know how much alcohol is in your drink. In the [place] one drink equals one 12 oz bottle of beer (355 mL), one 5 oz glass of wine (148 mL), or one 1 oz glass of hard liquor (44 mL). Do not use any products that contain nicotine or tobacco. These products include cigarettes, chewing tobacco, and vaping devices, such as e-cigarettes. If you need help quitting, ask  your health care provider. Activity  Follow a regular exercise program to stay fit. This will help you maintain your balance. Ask your health care provider what types of exercise are appropriate for you. If you need a cane or walker, use it as recommended by your health care provider. Wear supportive shoes that have nonskid soles. Safety  Remove any tripping hazards, such as rugs, cords, and clutter. Install safety equipment such as grab bars in bathrooms and safety rails on stairs. Keep rooms and walkways well-lit. General instructions Talk with your health care provider about your risks for falling. Tell your health care provider if: You fall. Be sure to tell your health care provider about all falls, even ones that seem minor. You feel dizzy, tiredness (fatigue), or off-balance. Take over-the-counter and prescription medicines only as told by your health care provider. These include supplements. Eat a healthy diet and maintain a healthy weight. A healthy diet includes low-fat dairy products, low-fat (lean) meats, and fiber from whole grains, beans, and lots of fruits and vegetables. Stay current with your vaccines. Schedule regular health, dental, and eye exams. Summary Having a healthy lifestyle and getting preventive care can help to protect your health and wellness after age 105. Screening and testing are the best way to find a health problem early and help you avoid having a fall. Early diagnosis and treatment give you the best chance for managing medical conditions that are more common for people who are older than age 80. Falls are a major cause of broken bones and head injuries in people who are older than age 70. Take precautions to prevent a fall at home. Work with your health care provider to learn what changes you can make to improve your health and wellness and to prevent falls. This information is not intended to replace advice given to you by your health care provider. Make  sure you discuss any questions you have with your health care provider. Document Revised: 11/28/2020 Document Reviewed: 11/28/2020 Elsevier Patient Education  2024 [employer].

## 2023-01-31 DIAGNOSIS — H25813 Combined forms of age-related cataract, bilateral: Secondary | ICD-10-CM | POA: Diagnosis not present

## 2023-02-04 ENCOUNTER — Ambulatory Visit
Admission: RE | Admit: 2023-02-04 | Discharge: 2023-02-04 | Disposition: A | Discharge status: Home / Self Care | Payer: PPO | Source: Ambulatory Visit | Attending: Family Medicine | Admitting: Family Medicine

## 2023-02-04 DIAGNOSIS — Z1231 Encounter for screening mammogram for malignant neoplasm of breast: Secondary | ICD-10-CM | POA: Diagnosis not present

## 2023-02-04 DIAGNOSIS — R92323 Mammographic fibroglandular density, bilateral breasts: Secondary | ICD-10-CM | POA: Insufficient documentation

## 2023-02-04 DIAGNOSIS — Z1239 Encounter for other screening for malignant neoplasm of breast: Secondary | ICD-10-CM | POA: Diagnosis present

## 2023-02-11 DIAGNOSIS — M9902 Segmental and somatic dysfunction of thoracic region: Secondary | ICD-10-CM | POA: Diagnosis not present

## 2023-02-11 DIAGNOSIS — M9901 Segmental and somatic dysfunction of cervical region: Secondary | ICD-10-CM | POA: Diagnosis not present

## 2023-02-11 DIAGNOSIS — M5033 Other cervical disc degeneration, cervicothoracic region: Secondary | ICD-10-CM | POA: Diagnosis not present

## 2023-02-11 DIAGNOSIS — M5412 Radiculopathy, cervical region: Secondary | ICD-10-CM | POA: Diagnosis not present

## 2023-02-25 DIAGNOSIS — L400 Psoriasis vulgaris: Secondary | ICD-10-CM | POA: Diagnosis not present

## 2023-02-25 DIAGNOSIS — Z872 Personal history of diseases of the skin and subcutaneous tissue: Secondary | ICD-10-CM | POA: Diagnosis not present

## 2023-02-25 DIAGNOSIS — D492 Neoplasm of unspecified behavior of bone, soft tissue, and skin: Secondary | ICD-10-CM | POA: Diagnosis not present

## 2023-02-25 DIAGNOSIS — L814 Other melanin hyperpigmentation: Secondary | ICD-10-CM | POA: Diagnosis not present

## 2023-02-25 DIAGNOSIS — Z86018 Personal history of other benign neoplasm: Secondary | ICD-10-CM | POA: Diagnosis not present

## 2023-02-25 DIAGNOSIS — D485 Neoplasm of uncertain behavior of skin: Secondary | ICD-10-CM | POA: Diagnosis not present

## 2023-02-25 DIAGNOSIS — L308 Other specified dermatitis: Secondary | ICD-10-CM | POA: Diagnosis not present

## 2023-02-25 DIAGNOSIS — L578 Other skin changes due to chronic exposure to nonionizing radiation: Secondary | ICD-10-CM | POA: Diagnosis not present

## 2023-02-25 DIAGNOSIS — L249 Irritant contact dermatitis, unspecified cause: Secondary | ICD-10-CM | POA: Diagnosis not present

## 2023-03-11 DIAGNOSIS — M9901 Segmental and somatic dysfunction of cervical region: Secondary | ICD-10-CM | POA: Diagnosis not present

## 2023-03-11 DIAGNOSIS — M5412 Radiculopathy, cervical region: Secondary | ICD-10-CM | POA: Diagnosis not present

## 2023-03-11 DIAGNOSIS — M5033 Other cervical disc degeneration, cervicothoracic region: Secondary | ICD-10-CM | POA: Diagnosis not present

## 2023-03-11 DIAGNOSIS — M9902 Segmental and somatic dysfunction of thoracic region: Secondary | ICD-10-CM | POA: Diagnosis not present

## 2023-03-25 ENCOUNTER — Telehealth: Payer: PPO | Admitting: Nurse Practitioner

## 2023-03-25 ENCOUNTER — Telehealth: Payer: PPO

## 2023-03-25 DIAGNOSIS — R3989 Other symptoms and signs involving the genitourinary system: Secondary | ICD-10-CM

## 2023-03-25 MED ORDER — NITROFURANTOIN MONOHYD MACRO 100 MG PO CAPS
100.0000 mg | ORAL_CAPSULE | Freq: Two times a day (BID) | ORAL | 0 refills | Status: AC
Start: 2023-03-25 — End: 2023-03-30

## 2023-03-25 MED ORDER — NITROFURANTOIN MONOHYD MACRO 100 MG PO CAPS
100.0000 mg | ORAL_CAPSULE | Freq: Two times a day (BID) | ORAL | 0 refills | Status: DC
Start: 2023-03-25 — End: 2023-03-25

## 2023-03-25 NOTE — Addendum Note (Signed)
Addended by: Viviano Simas E on: 03/25/2023 01:33 PM   Modules accepted: Orders

## 2023-03-25 NOTE — Progress Notes (Signed)
Virtual Visit Consent   Shannon [name], you are scheduled for a virtual visit with a [facility] provider today. Just as with appointments in the office, your consent must be obtained to participate. Your consent will be active for this visit and any virtual visit you may have with one of our providers in the next 365 days. If you have a MyChart account, a copy of this consent can be sent to you electronically.  As this is a virtual visit, video technology does not allow for your provider to perform a traditional examination. This may limit your provider's ability to fully assess your condition. If your provider identifies any concerns that need to be evaluated in person or the need to arrange testing (such as labs, EKG, etc.), we will make arrangements to do so. Although advances in technology are sophisticated, we cannot ensure that it will always work on either your end or our end. If the connection with a video visit is poor, the visit may have to be switched to a telephone visit. With either a video or telephone visit, we are not always able to ensure that we have a secure connection.  By engaging in this virtual visit, you consent to the provision of healthcare and authorize for your insurance to be billed (if applicable) for the services provided during this visit. Depending on your insurance coverage, you may receive a charge related to this service.  I need to obtain your verbal consent now. Are you willing to proceed with your visit today? Shannon [name] has provided verbal consent on 03/25/2023 for a virtual visit (video or telephone). Viviano Simas, FNP  Date: 03/25/2023 12:54 PM  Virtual Visit via Video Note   I, Viviano Simas, connected with  Shannon [name]  ([id number], [birth year]) on 03/25/23 at  1:00 PM EDT by a video-enabled telemedicine application and verified that I am speaking with the correct person using two identifiers.  Location: Patient: Virtual Visit Location Patient:  Home Provider: Virtual Visit Location Provider: Home Office   I discussed the limitations of evaluation and management by telemedicine and the availability of in person appointments. The patient expressed understanding and agreed to proceed.    History of Present Illness: Shannon [name] is a 72 y.o. who identifies as a female who was assigned female at birth, and is being seen today for burning with urination.  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   Cystitis 07/31/2021   Stress incontinence 11/08/2020   Stress 11/08/2020   Skin exam, screening for cancer 11/08/2020   Erythematous rash 10/15/2018   Ganglion cyst of finger 09/24/2018   Eczema of scalp 04/03/2017   Hair loss 06/02/2014   Routine general medical examination at a health care facility 11/24/2013   Hyperlipidemia 05/20/2013   Hypothyroidism 05/20/2013   Osteoporosis 05/20/2013   Thyroid nodule 05/20/2013   Carpal tunnel syndrome 02/10/2013   Elevated TSH 02/10/2013    Allergies: No  Known Allergies  Medications:  Current Outpatient Medications:    b complex vitamins capsule, Take by mouth., Disp: , Rfl:    Calcium Carb-Cholecalciferol 600-10 MG-MCG TABS, , Disp: , Rfl:    Cholecalciferol (D3 HIGH POTENCY) 125 MCG (5000 UT) capsule, , Disp: , Rfl:    Cranberry-Vitamin C-Vitamin E (CRANBERRY PLUS VITAMIN C) 4200-20-3 MG-MG-UNIT CAPS, , Disp: , Rfl:    estradiol (ESTRACE) 0.1 MG/GM vaginal cream, 1  gram intravaginally at bedtime daily for 2-3 weeks, then decrease to 3 times per week, Disp: 42.5 g, Rfl: 12   levothyroxine (SYNTHROID) 88 MCG tablet, TAKE 1 TABLET BY MOUTH DAILY, Disp: 90 tablet, Rfl: 1   Multiple Vitamins-Minerals (CENTRUM SILVER PO), Take 1 tablet by mouth daily., Disp: , Rfl:    nitrofurantoin, macrocrystal-monohydrate, (MACROBID) 100 MG capsule, Take 1 capsule (100 mg total) by mouth 2 (two) times daily., Disp: 10 capsule, Rfl: 0   Probiotic Product (PROBIOTIC-10 ULTIMATE PO), Take by mouth., Disp: , Rfl:    simvastatin (ZOCOR) 20 MG tablet, TAKE ONE TABLET BY MOUTH EVERY EVENING, Disp: 90 tablet, Rfl: 3  Observations/Objective: Patient is well-developed, well-nourished in no acute distress.  Resting comfortably  at home.  Head is normocephalic, atraumatic.  No labored breathing.  Speech is clear and coherent with logical content.  Patient is alert and oriented at baseline.    Assessment and Plan:  1. Suspected UTI  - nitrofurantoin, macrocrystal-monohydrate, (MACROBID) 100 MG capsule; Take 1 capsule (100 mg total) by mouth 2 (two) times daily for 5 days.  Dispense: 10 capsule; Refill: 0    Follow Up Instructions: I discussed the assessment and treatment plan with the patient. The patient was provided an opportunity to ask questions and all were answered. The patient agreed with the plan and demonstrated an understanding of the instructions.  A copy of instructions were sent to the patient via MyChart unless otherwise noted below.    The patient was advised to call back or seek an in-person evaluation if the symptoms worsen or if the condition fails to improve as anticipated.  Time:  I spent 12 minutes with the patient via telehealth technology discussing the above problems/concerns.    Viviano Simas, FNP

## 2023-04-09 ENCOUNTER — Encounter: Payer: Self-pay | Admitting: Family Medicine

## 2023-04-09 DIAGNOSIS — M9901 Segmental and somatic dysfunction of cervical region: Secondary | ICD-10-CM | POA: Diagnosis not present

## 2023-04-09 DIAGNOSIS — M9902 Segmental and somatic dysfunction of thoracic region: Secondary | ICD-10-CM | POA: Diagnosis not present

## 2023-04-09 DIAGNOSIS — M5033 Other cervical disc degeneration, cervicothoracic region: Secondary | ICD-10-CM | POA: Diagnosis not present

## 2023-04-09 DIAGNOSIS — M5412 Radiculopathy, cervical region: Secondary | ICD-10-CM | POA: Diagnosis not present

## 2023-05-01 ENCOUNTER — Encounter: Payer: Self-pay | Admitting: Family Medicine

## 2023-05-06 ENCOUNTER — Ambulatory Visit: Payer: PPO | Admitting: Internal Medicine

## 2023-05-06 ENCOUNTER — Encounter: Payer: Self-pay | Admitting: Internal Medicine

## 2023-05-06 VITALS — BP 110/60 | HR 69 | Ht 65.0 in | Wt 151.8 lb

## 2023-05-06 DIAGNOSIS — E041 Nontoxic single thyroid nodule: Secondary | ICD-10-CM

## 2023-05-06 DIAGNOSIS — E039 Hypothyroidism, unspecified: Secondary | ICD-10-CM | POA: Diagnosis not present

## 2023-05-06 LAB — TSH: TSH: 3.32 u[IU]/mL (ref 0.35–5.50)

## 2023-05-06 LAB — T4, FREE: Free T4: 0.9 ng/dL (ref 0.60–1.60)

## 2023-05-06 MED ORDER — LEVOTHYROXINE SODIUM 88 MCG PO TABS
88.0000 ug | ORAL_TABLET | Freq: Every day | ORAL | 3 refills | Status: DC
Start: 2023-05-06 — End: 2024-05-06

## 2023-05-06 NOTE — Patient Instructions (Signed)
Please continue: - Levothyroxine 88 mcg daily.  Take the thyroid hormone every day, with water, at least 30 minutes before breakfast, separated by at least 4 hours from: - acid reflux medications - calcium - iron - multivitamins  Please stop at the lab.  Please return in 1 year.

## 2023-05-06 NOTE — Progress Notes (Addendum)
Patient ID: Shannon [name], female   DOB: [birth year], 72 y.o.   MRN: [id number]  HPI f/u for  Shannon [name] is a 72 y.o.-year-old female, returning for f/u for MNG and hypothyroidism. Last visit 1 year ago.  Interim hx: Pt denies feeling nodules in neck, hoarseness, dysphagia, choking. She feels well, without complaints other than weight gain.  She does mention she is not dieting but plans to start.  She also did not exercise in the last 6 months, previously walking.  She plans to restart this.  Multinodular goiter Reviewed and addended history: Pt's PCP felt a right sided nodule at OV in 2014  - Thyroid U/S (05/22/2013): heterogeneous gland, multinodular goiter - largest nodule in left lobe: 2.5 x 1.4 x 2.4 cm, solid, slightly echogenic, moderate internal vascularity, no calcifications.  - largest nodule on the right: 1.4 x 0.9 x 1.0 cm, predominantly  isoechoic, with mild internal vascularity, no calcifications.   - FNA both nodules (06/12/2013): Adequacy Reason Satisfactory For Evaluation. Diagnosis THYROID, FINE NEEDLE ASPIRATION LEFT, (SPECIMEN 1 OF 2, COLLECTED ON 06/11/2013). 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  Specimen Clinical Information Nontoxic uninodular goiter, nodule, 2.5 x 1.4 x 2.4cm dominant left nodule   Adequacy Reason Satisfactory For Evaluation. Diagnosis THYROID, FINE NEEDLE ASPIRATION RIGHT, (SPECIMEN 2 OF 2, COLLECTED ON 11/20 2014). BENIGN. FINDINGS CONSISTENT WITH GOITER. FINDINGS CONSISTENT WITH THE CONTENTS OF A CYST.  Specimen Clinical Information Nontoxic uninodular goiter, Nodule, 1.4 x 0.9 x 1.0cm domnant right lobe thyroid nodule  The first nodule was characterized as consistent with a follicular neoplasm and/or lesion. This was not an entity described in the Bethesda criteria. I was not sure how to interpret this. The treatment plan for a follicular lesion would be to repeat FNA, while for follicular neoplasm would  be surgical lobectomy.  I discussed with the pathologist >> he suggested that we interpret this as FLUS.  Thyroid U/S (02/21/2015): Right thyroid lobe Measurements: 5.0 cm x 1.5 cm x 1.8 cm. Heterogeneous thyroid with relatively increased flow.  - Superior nodule measures 7 mm x 6 mm x 8 mm. - Previously biopsied lower right thyroid nodule measures 1.2 cm x 1.0 cm x 1.3 cm. Nodule is solid with internal reflectors. Left thyroid lobe Measurements: 6.3 cm x 2.0 cm x 1.9 cm.  Heterogeneous left thyroid with relatively increased flow. - Nodule at the inferior aspect has been previously biopsied and measures 2.9 cm x 1.8 cm x 2.8 cm. Isthmus Thickness: 3 mm.  No nodules visualized. Lymphadenopathy: None visualized.  Left thyroid nodule has increased in size. Due to previous history of follicular lesion of undetermined significance, I suggested repeat biopsy with [facility] molecular testing.     03/03/2015: FNA: Adequacy Reason Satisfactory For Evaluation. 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 COMMENT: THE SPECIMEN IS HYPERCELLULAR AND CONSISTS OF SMALL TO LARGE GROUPS OF FOLLICULAR EPITHELIAL CELLS WITH FOCAL HURTHLE CELL CHANGES. THERE IS MILD CYTOLOGIC ATYPIA, INCLUDING INTRANUCLEAR GROOVES. BASED ON THESE FEATURES, A FOLLICULAR LESION/NEOPLASM CAN NOT BE ENTIRELY RULED OUT. Specimen Clinical Information Nodule at the inferior aspect has been previously biopsied and measures 2.9 cm x 1.8 cm x 2.8 cm Source Thyroid, Fine Needle Aspiration, Left Lobe, (Specimen 1 of 1, collected on 03/02/15 )  AFIRMA test results were benign.  Thyroid U/S (10/15/2019): CLINICAL DATA:  Prior ultrasound follow-up. History of  multinodular thyroid goiter and status post prior fine-needle aspiration of both right and left inferior nodules. The  left inferior nodule has been sample twice including most recently on 03/02/2015. Cytology has demonstrated atypia undetermined significance (Bethesda 3).   THYROID ULTRASOUND Parenchymal Echotexture: Moderately heterogenous Isthmus: 0.4 cm Right lobe: 4.7 x 1.4 x 1.4 cm Left lobe: 5.8 x 1.4 x 1.7 cm _________________________________________________________   Nodule # 1: Prior biopsy: No Location: Right; Superior Maximum size: 1.0 cm; Other 2 dimensions: 0.9 x 0.6 cm, previously, 0.8 cm Composition: solid/almost completely solid (2) Echogenicity: isoechoic (1) Change in features: Yes Given size (<1.4 cm) and appearance, this nodule does NOT meet TI-RADS criteria for biopsy or dedicated follow-up. This nodule is barely visualized currently and may or may not still be present. On the prior study, a clearly appeared spongiform and benign in appearance.  ________________________________________________________   Nodule # 2: Prior biopsy: Yes Location: Right; Inferior Maximum size: 1.3 cm; Other 2 dimensions: 1.1 x 0.8 cm, previously, 1.3 cm Composition: solid/almost completely solid (2) Echogenicity: isoechoic (1) Given size (<1.4 cm) and appearance, this nodule does NOT meet TI-RADS criteria for biopsy or dedicated follow-up. This nodule appears stable. _________________________________________________________   Nodule # 3: Prior biopsy: Yes Location: Left; Inferior Maximum size: 2.4 cm; Other 2 dimensions: 2.2 x 1.5 cm, previously, 2.9 cm Composition: solid/almost completely solid (2) Echogenicity: isoechoic (1)  This nodule shows similar morphology and does appear to be smaller in overall volume compared to the 2016 study. It clearly has not grown.  ________________________________________________________   No abnormal lymph nodes identified.   IMPRESSION: 1. Residual nodule versus pseudo nodule in the superior right lobe measures 1 cm and does not meet criteria for  biopsy or dedicated follow-up. 2. Stable 1.3 cm right inferior solid nodule which has been previously sampled. 3. The left inferior thyroid nodule which has been previously sampled does appear to be smaller in volume and with similar morphology compared to the prior study in 2016. This nodule currently measures 2.4 x 1.5 x 2.2 cm compared to 2.9 x 1.8 x 2.8 cm previously.  Hypothyroidism  - dx ~2003 >> started on levoxyl then >> but stopped subsequently as she did not feel different. She restarted LT4, on 25 mcg daily and we then increased the dose gradually.  She was initially not taking this correctly, but she now takes it as advised.  Pt is now on levothyroxine 88 mcg daily, taken: - in am - fasting - black coffee - at least 30-40 min from b'fast - no Fe, no PPIs - + Calcium daily - + multivitamins with L - stopped Biotin (120 MCG)  Reviewed her TFTs: Lab Results  Component Value Date   TSH 2.25 11/26/2022   TSH 2.61 05/07/2022   TSH 3.07 11/08/2021   TSH 2.25 11/03/2020   TSH 3.51 10/02/2019   TSH 2.48 09/22/2018   TSH 3.89 01/03/2018   TSH 6.51 (H) 10/09/2017   TSH 3.22 09/06/2017   TSH 2.71 10/08/2016   FREET4 0.92 11/26/2022   FREET4 0.89 05/07/2022   FREET4 1.02 11/08/2021   FREET4 0.92 11/03/2020   FREET4 1.15 10/02/2019   FREET4 0.94 09/22/2018   FREET4 1.01 01/03/2018   FREET4 0.84 10/09/2017   FREET4 1.33 09/06/2017   FREET4 0.90 10/08/2016    She has a history of osteoporosis-on alendronate-when she remembers, HL - on Zocor, hair loss (on Rogaine).  ROS: + see HPI  I reviewed pt's medications, allergies, PMH, social hx,  family hx, and changes were documented in the history of present illness. Otherwise, unchanged from my initial visit note.  Past Medical History:  Diagnosis Date   GERD (gastroesophageal reflux disease)    Hyperlipidemia    Osteoporosis    now has osteopenia   Thyroid disease    Past Surgical History:  Procedure Laterality  Date   HAND SURGERY     40 yrs ago   History   Social History   Marital Status: Married    Spouse Name: N/A    Number of Children: 1   Occupational History   retail.   Social History Main Topics   Smoking status: Former Smoker    Types: Cigarettes    Quit date: 01/18/1989   Smokeless tobacco: Never Used   Alcohol Use: 4.2 oz/week    7 Glasses of wine per week   Drug Use: No   Sexual Activity: Yes    Partners: Male   Social History Narrative   Lives in [place] with husband. 23YO son, lives in [place], Consulting civil engineer. Dog in home. Born in [place], raised in [place]. College [place]. Previously lived in [place].      Diet - regular diet      Exercise - walking   Caffeine: coffee in the morning; cup of hot tea      Hobbies - yardwork      Work - [employer] in [place]   Current Outpatient Medications on File Prior to Visit  Medication Sig Dispense Refill   b complex vitamins capsule Take by mouth.     Calcium Carb-Cholecalciferol 600-10 MG-MCG TABS      Cholecalciferol (D3 HIGH POTENCY) 125 MCG (5000 UT) capsule      Cranberry-Vitamin C-Vitamin E (CRANBERRY PLUS VITAMIN C) 4200-20-3 MG-MG-UNIT CAPS      estradiol (ESTRACE) 0.1 MG/GM vaginal cream 1 gram intravaginally at bedtime daily for 2-3 weeks, then decrease to 3 times per week 42.5 g 12   levothyroxine (SYNTHROID) 88 MCG tablet TAKE 1 TABLET BY MOUTH DAILY 90 tablet 1   Multiple Vitamins-Minerals (CENTRUM SILVER PO) Take 1 tablet by mouth daily.     Probiotic Product (PROBIOTIC-10 ULTIMATE PO) Take by mouth.     simvastatin (ZOCOR) 20 MG tablet TAKE ONE TABLET BY MOUTH EVERY EVENING 90 tablet 3   No current facility-administered medications on file prior to visit.   No Known Allergies Family History  Problem Relation Age of Onset   Breast cancer Mother 87   Heart disease Father    Cancer Father        Prostate   Diabetes Father        diagnosed later in life   Prostate cancer Father    Colon polyps Father 10    Parkinson's disease Father    Congestive Heart Failure Father    Asthma Brother    Colon cancer Neg Hx    Rectal cancer Neg Hx    Stomach cancer Neg Hx    Esophageal cancer Neg Hx    PE: BP 110/60   Pulse 69   Ht 5\' 5"  (1.651 m)   Wt 151 lb 12.8 oz (68.9 kg)   SpO2 99%   BMI 25.26 kg/m  Wt Readings from Last 3 Encounters:  05/06/23 151 lb 12.8 oz (68.9 kg)  01/29/23 145 lb (65.8 kg)  11/28/22 149 lb (67.6 kg)   Constitutional: normal weight, in NAD Eyes:  EOMI, no exophthalmos ENT: + mild thyromegaly, no cervical lymphadenopathy Cardiovascular: RRR,  No MRG Respiratory: CTA B Musculoskeletal: no deformities Skin:no rashes Neurological: no tremor with outstretched hands  ASSESSMENT: 1. MNG  2. Hypothyroidism  PLAN: 1.  Multinodular goiter -She denies neck compression symptoms -We reviewed the latest thyroid ultrasound report from 2021.  The nodules were stable or smaller than before.  The dominant nodule measured 2.4 x 2.2 x 1.5 cm (previously 2.9 cm in the largest dimension) and was isoechoic with mild to moderate internal blood flow, more wide than tall.  The nodule was biopsied and the biopsy returned inconclusive (FLUS x 2), however, Afirma molecular marker returned benign, so the risk of cancer is very low.  No biopsy was needed at that time. -Will recheck the ultrasound now - I will see her back in 1 year  2. Hypothyroidism - latest thyroid labs reviewed with pt. >> normal: Lab Results  Component Value Date   TSH 2.25 11/26/2022  - she continues on LT4 88 mcg daily - pt feels good on this dose, except weight gain 5 lbs in the setting of decreased exercise and relaxed diet - we discussed about taking the thyroid hormone every day, with water, >30 minutes before breakfast, separated by >4 hours from acid reflux medications, calcium, iron, multivitamins. Pt. is taking it correctly. - will check thyroid tests today: TSH and fT4 - If labs are abnormal, she will need  to return for repeat TFTs in 1.5 months - OTW, I will see her back in a year  Needs refills.  Component     Latest Ref Rng 05/06/2023  TSH     0.35 - 5.50 uIU/mL 3.32   T4,Free(Direct)     0.60 - 1.60 ng/dL 1.61   Thyroid test are normal.  Will continue the same LT4 dose.  Thyroid U/S (05/09/2023): Parenchymal Echotexture: Moderately heterogenous  Isthmus: 0.3 cm  Right lobe: 4.5 x 1.3 x 1.4 cm  Left lobe: 5.5 x 1.8 x 1.4 cm  _________________________________________________________   Estimated total number of nodules >/= 1 cm: 2 _________________________________________________________   Nodule labeled 1 is a previously described solid isoechoic/hyperechoic TR 3 nodule versus pseudo nodule in the right superior thyroid lobe measuring up to 0.6 cm on today's exam, previously 1.0 cm in 2021. Additionally, it has remained similar to the 2016 examination, and stability over a period of greater than 5 years suggests benign etiology. This nodule does NOT meet TI-RADS criteria for biopsy or dedicated follow-up.   Nodule labeled 2 is a previously biopsied solid nodule in the inferior right thyroid lobe measuring up to 1.3 cm, previously 1.3 cm. It remains similar in size and morphology.   Nodule labeled 3 is a previously biopsied solid nodule in the inferior left thyroid lobe measuring up to 2.6 cm on today's exam, previously 2.4 cm. The nodule overall remains similar in size and morphology.   IMPRESSION: 1. Multinodular thyroid gland.  No new or enlarging thyroid nodules. 2. Similar appearance of previously biopsied nodules in the inferior right and left thyroid lobes. Correlate with biopsy results.   Carlus Pavlov, MD PhD [facility]

## 2023-05-08 DIAGNOSIS — M5412 Radiculopathy, cervical region: Secondary | ICD-10-CM | POA: Diagnosis not present

## 2023-05-08 DIAGNOSIS — M9901 Segmental and somatic dysfunction of cervical region: Secondary | ICD-10-CM | POA: Diagnosis not present

## 2023-05-08 DIAGNOSIS — M5033 Other cervical disc degeneration, cervicothoracic region: Secondary | ICD-10-CM | POA: Diagnosis not present

## 2023-05-08 DIAGNOSIS — M9902 Segmental and somatic dysfunction of thoracic region: Secondary | ICD-10-CM | POA: Diagnosis not present

## 2023-05-09 ENCOUNTER — Ambulatory Visit
Admission: RE | Admit: 2023-05-09 | Discharge: 2023-05-09 | Disposition: A | Discharge status: Home / Self Care | Payer: PPO | Source: Ambulatory Visit | Attending: Internal Medicine | Admitting: Internal Medicine

## 2023-05-09 DIAGNOSIS — E042 Nontoxic multinodular goiter: Secondary | ICD-10-CM | POA: Diagnosis not present

## 2023-05-09 DIAGNOSIS — E041 Nontoxic single thyroid nodule: Secondary | ICD-10-CM

## 2023-06-05 DIAGNOSIS — M5412 Radiculopathy, cervical region: Secondary | ICD-10-CM | POA: Diagnosis not present

## 2023-06-05 DIAGNOSIS — M9902 Segmental and somatic dysfunction of thoracic region: Secondary | ICD-10-CM | POA: Diagnosis not present

## 2023-06-05 DIAGNOSIS — M9901 Segmental and somatic dysfunction of cervical region: Secondary | ICD-10-CM | POA: Diagnosis not present

## 2023-06-05 DIAGNOSIS — M5033 Other cervical disc degeneration, cervicothoracic region: Secondary | ICD-10-CM | POA: Diagnosis not present

## 2023-06-25 DIAGNOSIS — M9902 Segmental and somatic dysfunction of thoracic region: Secondary | ICD-10-CM | POA: Diagnosis not present

## 2023-06-25 DIAGNOSIS — M5412 Radiculopathy, cervical region: Secondary | ICD-10-CM | POA: Diagnosis not present

## 2023-06-25 DIAGNOSIS — M5033 Other cervical disc degeneration, cervicothoracic region: Secondary | ICD-10-CM | POA: Diagnosis not present

## 2023-06-25 DIAGNOSIS — M9901 Segmental and somatic dysfunction of cervical region: Secondary | ICD-10-CM | POA: Diagnosis not present

## 2023-06-26 DIAGNOSIS — G8929 Other chronic pain: Secondary | ICD-10-CM | POA: Diagnosis not present

## 2023-06-26 DIAGNOSIS — Z87891 Personal history of nicotine dependence: Secondary | ICD-10-CM | POA: Diagnosis not present

## 2023-06-26 DIAGNOSIS — M8580 Other specified disorders of bone density and structure, unspecified site: Secondary | ICD-10-CM | POA: Diagnosis not present

## 2023-06-26 DIAGNOSIS — E785 Hyperlipidemia, unspecified: Secondary | ICD-10-CM | POA: Diagnosis not present

## 2023-06-26 DIAGNOSIS — E039 Hypothyroidism, unspecified: Secondary | ICD-10-CM | POA: Diagnosis not present

## 2023-07-04 DIAGNOSIS — M9901 Segmental and somatic dysfunction of cervical region: Secondary | ICD-10-CM | POA: Diagnosis not present

## 2023-07-04 DIAGNOSIS — M5033 Other cervical disc degeneration, cervicothoracic region: Secondary | ICD-10-CM | POA: Diagnosis not present

## 2023-07-04 DIAGNOSIS — M9902 Segmental and somatic dysfunction of thoracic region: Secondary | ICD-10-CM | POA: Diagnosis not present

## 2023-07-04 DIAGNOSIS — M5412 Radiculopathy, cervical region: Secondary | ICD-10-CM | POA: Diagnosis not present

## 2023-07-08 DIAGNOSIS — M9901 Segmental and somatic dysfunction of cervical region: Secondary | ICD-10-CM | POA: Diagnosis not present

## 2023-07-08 DIAGNOSIS — M5033 Other cervical disc degeneration, cervicothoracic region: Secondary | ICD-10-CM | POA: Diagnosis not present

## 2023-07-08 DIAGNOSIS — M5412 Radiculopathy, cervical region: Secondary | ICD-10-CM | POA: Diagnosis not present

## 2023-07-08 DIAGNOSIS — M9902 Segmental and somatic dysfunction of thoracic region: Secondary | ICD-10-CM | POA: Diagnosis not present

## 2023-07-10 DIAGNOSIS — M9901 Segmental and somatic dysfunction of cervical region: Secondary | ICD-10-CM | POA: Diagnosis not present

## 2023-07-10 DIAGNOSIS — M9902 Segmental and somatic dysfunction of thoracic region: Secondary | ICD-10-CM | POA: Diagnosis not present

## 2023-07-10 DIAGNOSIS — M5033 Other cervical disc degeneration, cervicothoracic region: Secondary | ICD-10-CM | POA: Diagnosis not present

## 2023-07-10 DIAGNOSIS — M5412 Radiculopathy, cervical region: Secondary | ICD-10-CM | POA: Diagnosis not present

## 2023-07-15 DIAGNOSIS — M9902 Segmental and somatic dysfunction of thoracic region: Secondary | ICD-10-CM | POA: Diagnosis not present

## 2023-07-15 DIAGNOSIS — M5412 Radiculopathy, cervical region: Secondary | ICD-10-CM | POA: Diagnosis not present

## 2023-07-15 DIAGNOSIS — M5033 Other cervical disc degeneration, cervicothoracic region: Secondary | ICD-10-CM | POA: Diagnosis not present

## 2023-07-15 DIAGNOSIS — M9901 Segmental and somatic dysfunction of cervical region: Secondary | ICD-10-CM | POA: Diagnosis not present

## 2023-07-18 DIAGNOSIS — M9901 Segmental and somatic dysfunction of cervical region: Secondary | ICD-10-CM | POA: Diagnosis not present

## 2023-07-18 DIAGNOSIS — M5033 Other cervical disc degeneration, cervicothoracic region: Secondary | ICD-10-CM | POA: Diagnosis not present

## 2023-07-18 DIAGNOSIS — M9902 Segmental and somatic dysfunction of thoracic region: Secondary | ICD-10-CM | POA: Diagnosis not present

## 2023-07-18 DIAGNOSIS — M5412 Radiculopathy, cervical region: Secondary | ICD-10-CM | POA: Diagnosis not present

## 2023-07-22 DIAGNOSIS — M9901 Segmental and somatic dysfunction of cervical region: Secondary | ICD-10-CM | POA: Diagnosis not present

## 2023-07-22 DIAGNOSIS — M9902 Segmental and somatic dysfunction of thoracic region: Secondary | ICD-10-CM | POA: Diagnosis not present

## 2023-07-22 DIAGNOSIS — M5412 Radiculopathy, cervical region: Secondary | ICD-10-CM | POA: Diagnosis not present

## 2023-07-22 DIAGNOSIS — M5033 Other cervical disc degeneration, cervicothoracic region: Secondary | ICD-10-CM | POA: Diagnosis not present

## 2023-07-29 ENCOUNTER — Encounter: Payer: Self-pay | Admitting: Family Medicine

## 2023-07-29 NOTE — Telephone Encounter (Signed)
 Can we get her set up for a visit with me to determine what the next step in treatment and evaluation would be?

## 2023-07-31 ENCOUNTER — Ambulatory Visit: Payer: PPO

## 2023-07-31 ENCOUNTER — Ambulatory Visit (INDEPENDENT_AMBULATORY_CARE_PROVIDER_SITE_OTHER): Payer: PPO | Admitting: Family Medicine

## 2023-07-31 VITALS — BP 118/66 | HR 71 | Temp 98.8°F | Ht 65.0 in | Wt 151.4 lb

## 2023-07-31 DIAGNOSIS — M5412 Radiculopathy, cervical region: Secondary | ICD-10-CM

## 2023-07-31 DIAGNOSIS — N390 Urinary tract infection, site not specified: Secondary | ICD-10-CM | POA: Diagnosis not present

## 2023-07-31 DIAGNOSIS — R0982 Postnasal drip: Secondary | ICD-10-CM

## 2023-07-31 DIAGNOSIS — M542 Cervicalgia: Secondary | ICD-10-CM | POA: Diagnosis not present

## 2023-07-31 DIAGNOSIS — M419 Scoliosis, unspecified: Secondary | ICD-10-CM | POA: Diagnosis not present

## 2023-07-31 DIAGNOSIS — R200 Anesthesia of skin: Secondary | ICD-10-CM | POA: Diagnosis not present

## 2023-07-31 DIAGNOSIS — M720 Palmar fascial fibromatosis [Dupuytren]: Secondary | ICD-10-CM

## 2023-07-31 DIAGNOSIS — M47812 Spondylosis without myelopathy or radiculopathy, cervical region: Secondary | ICD-10-CM | POA: Diagnosis not present

## 2023-07-31 MED ORDER — PREDNISONE 20 MG PO TABS: 40.0000 mg | ORAL_TABLET | Freq: Every day | ORAL | 0 refills | Status: DC

## 2023-07-31 MED ORDER — NITROFURANTOIN MONOHYD MACRO 100 MG PO CAPS
100.0000 mg | ORAL_CAPSULE | Freq: Two times a day (BID) | ORAL | 0 refills | Status: DC
Start: 2023-07-31 — End: 2023-10-03

## 2023-07-31 NOTE — Patient Instructions (Signed)
 Nice to see you. If you do not hear from orthopedics in the next week or 2 please let us know.

## 2023-07-31 NOTE — Assessment & Plan Note (Signed)
 Possibly contributing to her sensation of wheezing.  She will try adding Claritin or Zyrtec over-the-counter.  If this is not beneficial she will let us know.

## 2023-07-31 NOTE — Assessment & Plan Note (Signed)
 Chronic issue.  Refer to hand specialist.

## 2023-07-31 NOTE — Assessment & Plan Note (Signed)
 Chronic issue.  With likely arthritis in her neck and some nerve impingement.  X-ray to be completed today.  Prescription for prednisone sent to pharmacy.  Discussed risk of agitation and sleep issues with this.  Will refer to a spine specialist as well.

## 2023-07-31 NOTE — Assessment & Plan Note (Signed)
 Chronic issue.  No symptoms at this time.  Prescription for Macrobid provided for her to have on hand to start if she develops UTI symptoms.  Advised if this is not beneficial when she has symptoms she needs to be evaluated.

## 2023-07-31 NOTE — Progress Notes (Signed)
 Camellia Her, MD Phone: [phone]  Shannon [name] is a 73 y.o. female who presents today for follow-up.  Neck pain: Patient has this has been going on for some time now.  She notes her neck clicks a lot when she moves it.  She notes no sharp pain though does note some discomfort.  Notes at times she cannot turn it all the way.  Notes some numbness in her hands if she sleeps on her side.  No weakness.  No radiation of the discomfort.  She tried ibuprofen and Tylenol with no benefit.  She has been seeing a chiropractor with no benefit.  Recurrent UTI: Patient notes she has not had a UTI in several months.  She did get worked up through urology.  Previously she was having a UTI about once a month.  She was using topical estrogen though she got away from that.  Currently no symptoms.  She is wondering about having Macrobid  on hand in case she develops UTI symptoms.  Right hand issue: Patient reports discomfort in her palm and has some contracture like appearance to the palm.  Wheezing: Patient reports her husband advised her that she wheezes at night.  She wonders if it is related to postnasal drip.  She does not take any allergy medications.  Social History   Tobacco Use  Smoking Status Former   Current packs/day: 0.00   Types: Cigarettes   Quit date: 01/18/1989   Years since quitting: 34.5  Smokeless Tobacco Never    Current Outpatient Medications on File Prior to Visit  Medication Sig Dispense Refill   b complex vitamins capsule Take by mouth.     Calcium Carb-Cholecalciferol 600-10 MG-MCG TABS      Cholecalciferol (D3 HIGH POTENCY) 125 MCG (5000 UT) capsule      Cranberry-Vitamin C-Vitamin E (CRANBERRY PLUS VITAMIN C) 4200-20-3 MG-MG-UNIT CAPS      estradiol  (ESTRACE ) 0.1 MG/GM vaginal cream 1 gram intravaginally at bedtime daily for 2-3 weeks, then decrease to 3 times per week 42.5 g 12   levothyroxine  (SYNTHROID ) 88 MCG tablet Take 1 tablet (88 mcg total) by mouth daily. 90  tablet 3   Multiple Vitamins-Minerals (CENTRUM SILVER PO) Take 1 tablet by mouth daily.     Probiotic Product (PROBIOTIC-10 ULTIMATE PO) Take by mouth.     simvastatin  (ZOCOR ) 20 MG tablet TAKE ONE TABLET BY MOUTH EVERY EVENING 90 tablet 3   No current facility-administered medications on file prior to visit.     ROS see history of present illness  Objective  Physical Exam Vitals:   07/31/23 1145  BP: 118/66  Pulse: 71  Temp: 98.8 F (37.1 C)  SpO2: 97%    BP Readings from Last 3 Encounters:  07/31/23 118/66  05/06/23 110/60  11/28/22 122/76   Wt Readings from Last 3 Encounters:  07/31/23 151 lb 6.4 oz (68.7 kg)  05/06/23 151 lb 12.8 oz (68.9 kg)  01/29/23 145 lb (65.8 kg)    Physical Exam Constitutional:      General: She is not in acute distress.    Appearance: She is not diaphoretic.  Cardiovascular:     Rate and Rhythm: Normal rate and regular rhythm.     Heart sounds: Normal heart sounds.  Pulmonary:     Effort: Pulmonary effort is normal.     Breath sounds: Normal breath sounds.  Musculoskeletal:     Comments: No midline tenderness, no midline neck step-off, no muscular neck tenderness, Dupuytren's contracture noted in her  right hand  Skin:    General: Skin is warm and dry.  Neurological:     Mental Status: She is alert.     Comments: 5/5 strength in bilateral biceps, triceps, grip, quads, hamstrings, plantar and dorsiflexion, sensation to light touch intact in bilateral UE and LE      Assessment/Plan: Please see individual problem list.  Cervical radiculopathy Assessment & Plan: Chronic issue.  With likely arthritis in her neck and some nerve impingement.  X-ray to be completed today.  Prescription for prednisone  sent to pharmacy.  Discussed risk of agitation and sleep issues with this.  Will refer to a spine specialist as well.  Orders: -     Ambulatory referral to Orthopedic Surgery -     DG Cervical Spine Complete; Future -     predniSONE ;  Take 2 tablets (40 mg total) by mouth daily with breakfast.  Dispense: 10 tablet; Refill: 0  Dupuytren's contracture Assessment & Plan: Chronic issue.  Refer to hand specialist.  Orders: -     Ambulatory referral to Orthopedic Surgery  Recurrent UTI Assessment & Plan: Chronic issue.  No symptoms at this time.  Prescription for Macrobid  provided for her to have on hand to start if she develops UTI symptoms.  Advised if this is not beneficial when she has symptoms she needs to be evaluated.  Orders: -     Nitrofurantoin  Monohyd Macro; Take 1 capsule (100 mg total) by mouth 2 (two) times daily.  Dispense: 14 capsule; Refill: 0  Postnasal drip Assessment & Plan: Possibly contributing to her sensation of wheezing.  She will try adding Claritin or Zyrtec over-the-counter.  If this is not beneficial she will let us  know.     Return if symptoms worsen or fail to improve.   Camellia Her, MD [facility]

## 2023-08-02 ENCOUNTER — Telehealth: Payer: Self-pay | Admitting: Family Medicine

## 2023-08-02 NOTE — Telephone Encounter (Signed)
 Copied from CRM [id number]. Topic: Medical Record Request - Provider/Facility Request >> Aug 02, 2023  9:12 AM Leotis ORN wrote: Reason for CRM: Apolinar with [facility] received referral, pt is scheduled for 08/09/23, wanda is requesting copy of pts Xray imaging for her appt.  Callback #    [phone]   fax# [phone]

## 2023-08-09 DIAGNOSIS — M542 Cervicalgia: Secondary | ICD-10-CM | POA: Diagnosis not present

## 2023-08-22 ENCOUNTER — Telehealth: Payer: Self-pay

## 2023-08-22 NOTE — Telephone Encounter (Signed)
Copied from CRM [id number]. Topic: Appointments - Scheduling Inquiry for Clinic >> Aug 22, 2023 10:15 AM Leavy Cella D wrote: Reason for CRM: Patient was advised that Dr.Tullo will be accepting new patient soon . Patient would like to notified when Dr.Tullo will be accepting new patients  I spoke with patient and let her know that we have no indication that Dr. Duncan Dull is planning to accept new patients.

## 2023-09-01 DIAGNOSIS — M542 Cervicalgia: Secondary | ICD-10-CM | POA: Insufficient documentation

## 2023-09-03 ENCOUNTER — Encounter: Payer: Self-pay | Admitting: Family Medicine

## 2023-09-14 ENCOUNTER — Telehealth: Payer: PPO | Admitting: Physician Assistant

## 2023-09-14 DIAGNOSIS — N390 Urinary tract infection, site not specified: Secondary | ICD-10-CM

## 2023-09-14 DIAGNOSIS — R3989 Other symptoms and signs involving the genitourinary system: Secondary | ICD-10-CM

## 2023-09-14 MED ORDER — CEPHALEXIN 500 MG PO CAPS: 500.0000 mg | ORAL_CAPSULE | Freq: Two times a day (BID) | ORAL | 0 refills | Status: AC

## 2023-09-14 NOTE — Patient Instructions (Signed)
  Shannon [name], thank you for joining Laure Kidney, PA-C for today's virtual visit.  While this provider is not your primary care provider (PCP), if your PCP is located in our provider database this encounter information will be shared with them immediately following your visit.   A [facility] MyChart account gives you access to today's visit and all your visits, tests, and labs performed at [facility] " click here if you don't have a [facility] MyChart account or go to [email or web address]  Consent: (Patient) Shannon [name] provided verbal consent for this virtual visit at the beginning of the encounter.  Current Medications:  Current Outpatient Medications:    b complex vitamins capsule, Take by mouth., Disp: , Rfl:    Calcium Carb-Cholecalciferol 600-10 MG-MCG TABS, , Disp: , Rfl:    Cholecalciferol (D3 HIGH POTENCY) 125 MCG (5000 UT) capsule, , Disp: , Rfl:    Cranberry-Vitamin C-Vitamin E (CRANBERRY PLUS VITAMIN C) 4200-20-3 MG-MG-UNIT CAPS, , Disp: , Rfl:    estradiol (ESTRACE) 0.1 MG/GM vaginal cream, 1 gram intravaginally at bedtime daily for 2-3 weeks, then decrease to 3 times per week, Disp: 42.5 g, Rfl: 12   levothyroxine (SYNTHROID) 88 MCG tablet, Take 1 tablet (88 mcg total) by mouth daily., Disp: 90 tablet, Rfl: 3   Multiple Vitamins-Minerals (CENTRUM SILVER PO), Take 1 tablet by mouth daily., Disp: , Rfl:    nitrofurantoin, macrocrystal-monohydrate, (MACROBID) 100 MG capsule, Take 1 capsule (100 mg total) by mouth 2 (two) times daily., Disp: 14 capsule, Rfl: 0   predniSONE (DELTASONE) 20 MG tablet, Take 2 tablets (40 mg total) by mouth daily with breakfast., Disp: 10 tablet, Rfl: 0   Probiotic Product (PROBIOTIC-10 ULTIMATE PO), Take by mouth., Disp: , Rfl:    simvastatin (ZOCOR) 20 MG tablet, TAKE ONE TABLET BY MOUTH EVERY EVENING, Disp: 90 tablet, Rfl: 3   Medications ordered in this encounter:  No orders of the defined types were placed in this  encounter.    *If you need refills on other medications prior to your next appointment, please contact your pharmacy*  Follow-Up: Call back or seek an in-person evaluation if the symptoms worsen or if the condition fails to improve as anticipated.  [facility] [phone]  Other Instructions Please report to the nearest Emergency room with any worsening symptoms. Follow up with primary care provider (PCP) in 2 -3 days.    If you have been instructed to have an in-person evaluation today at a local Urgent Care facility, please use the link below. It will take you to a list of all of our available [facility], including address, phone number and hours of operation. Please do not delay care.  [facility]  If you or a family member do not have a primary care provider, use the link below to schedule a visit and establish care. When you choose a [facility] primary care physician or advanced practice provider, you gain a long-term partner in health. Find a Primary Care Provider  Learn more about [facility]'s in-office and virtual care options:  - Get Care Now

## 2023-09-14 NOTE — Progress Notes (Signed)
  Because of the need to obtain a urine culture, I feel your condition warrants further evaluation and I recommend that you be seen in a face-to-face visit.   NOTE: There will be NO CHARGE for this E-Visit   If you are having a true medical emergency, please call 911.     For an urgent face to face visit, [facility] has multiple urgent care centers for your convenience.  Click the link below for the full list of locations and hours, walk-in wait times, appointment scheduling options and driving directions:  Urgent Care - [place], [place], [place], [place], [place], [place]  [place]     Your MyChart E-visit questionnaire answers were reviewed by a board certified advanced clinical practitioner to complete your personal care plan based on your specific symptoms.    Thank you for using e-Visits.

## 2023-09-14 NOTE — Progress Notes (Signed)
 Virtual Visit Consent   [name], you are scheduled for a virtual visit with a  provider today. Just as with appointments in the office, your consent must be obtained to participate. Your consent will be active for this visit and any virtual visit you may have with one of our providers in the next 365 days. If you have a MyChart account, a copy of this consent can be sent to you electronically.  As this is a virtual visit, video technology does not allow for your provider to perform a traditional examination. This may limit your provider's ability to fully assess your condition. If your provider identifies any concerns that need to be evaluated in person or the need to arrange testing (such as labs, EKG, etc.), we will make arrangements to do so. Although advances in technology are sophisticated, we cannot ensure that it will always work on either your end or our end. If the connection with a video visit is poor, the visit may have to be switched to a telephone visit. With either a video or telephone visit, we are not always able to ensure that we have a secure connection.  By engaging in this virtual visit, you consent to the provision of healthcare and authorize for your insurance to be billed (if applicable) for the services provided during this visit. Depending on your insurance coverage, you may receive a charge related to this service.  I need to obtain your verbal consent now. Are you willing to proceed with your visit today? Shannon [name] has provided verbal consent on 09/14/2023 for a virtual visit (video or telephone). Laure Kidney, [place]  Date: 09/14/2023 1:22 PM   Virtual Visit via Video Note   I, Laure Kidney, connected with  Shannon [name]  ([id number], 73-17-1952) on 09/14/23 at  1:15 PM EST by a video-enabled telemedicine application and verified that I am speaking with the correct person using two identifiers.  Location: Patient: Virtual Visit Location Patient:  Home Provider: Virtual Visit Location Provider: Home Office   I discussed the limitations of evaluation and management by telemedicine and the availability of in person appointments. The patient expressed understanding and agreed to proceed.    History of Present Illness: Shannon [name] is a 73 y.o. who identifies as a female who was assigned female at birth, and is being seen today for UTI.  HPI: Urinary Tract Infection  This is a recurrent problem. The current episode started in the past 7 days. The problem occurs every urination. The quality of the pain is described as burning. The pain is mild. There has been no fever. Associated symptoms include frequency. She has tried antibiotics for the symptoms. The treatment provided no relief. Her past medical history is significant for recurrent UTIs.    Problems:  Patient Active Problem List   Diagnosis Date Noted   Cervical radiculopathy 07/31/2023   Dupuytren's contracture 07/31/2023   Recurrent UTI 07/31/2023   Postnasal drip 07/31/2023   LFTs abnormal 11/10/2021   Toe deformity 11/10/2021   Cystitis 07/31/2021   Stress incontinence 11/08/2020   Stress 11/08/2020   Skin exam, screening for cancer 11/08/2020   Erythematous rash 10/15/2018   Ganglion cyst of finger 09/24/2018   Eczema of scalp 04/03/2017   Hair loss 06/02/2014   Routine general medical examination at a health care facility 11/24/2013   Hyperlipidemia 05/20/2013   Hypothyroidism 05/20/2013   Osteoporosis 05/20/2013   Thyroid nodule 05/20/2013   Carpal tunnel syndrome 02/10/2013  Elevated TSH 02/10/2013    Allergies: No Known Allergies Medications:  Current Outpatient Medications:    b complex vitamins capsule, Take by mouth., Disp: , Rfl:    Calcium Carb-Cholecalciferol 600-10 MG-MCG TABS, , Disp: , Rfl:    Cholecalciferol (D3 HIGH POTENCY) 125 MCG (5000 UT) capsule, , Disp: , Rfl:    Cranberry-Vitamin C-Vitamin E (CRANBERRY PLUS VITAMIN C) 4200-20-3  MG-MG-UNIT CAPS, , Disp: , Rfl:    estradiol (ESTRACE) 0.1 MG/GM vaginal cream, 1 gram intravaginally at bedtime daily for 2-3 weeks, then decrease to 3 times per week, Disp: 42.5 g, Rfl: 12   levothyroxine (SYNTHROID) 88 MCG tablet, Take 1 tablet (88 mcg total) by mouth daily., Disp: 90 tablet, Rfl: 3   Multiple Vitamins-Minerals (CENTRUM SILVER PO), Take 1 tablet by mouth daily., Disp: , Rfl:    nitrofurantoin, macrocrystal-monohydrate, (MACROBID) 100 MG capsule, Take 1 capsule (100 mg total) by mouth 2 (two) times daily., Disp: 14 capsule, Rfl: 0   predniSONE (DELTASONE) 20 MG tablet, Take 2 tablets (40 mg total) by mouth daily with breakfast., Disp: 10 tablet, Rfl: 0   Probiotic Product (PROBIOTIC-10 ULTIMATE PO), Take by mouth., Disp: , Rfl:    simvastatin (ZOCOR) 20 MG tablet, TAKE ONE TABLET BY MOUTH EVERY EVENING, Disp: 90 tablet, Rfl: 3  Observations/Objective: Patient is well-developed, well-nourished in no acute distress.  Resting comfortably  at home.  Head is normocephalic, atraumatic.  No labored breathing.  Speech is clear and coherent with logical content.  Patient is alert and oriented at baseline.    Assessment and Plan: 1. Recurrent UTI (Primary) patient with a history of recurrent cystitis presents with symptoms consistent with acute uncomplicated cystitis. No systemic symptoms. Not septic. Well appearing. Low suspicion for acute pyelonephritis given lack of fever, CVAT, or systemic features. Low suspicion for kidney stone or infected stone. Advised her that if her symptoms  do not improve on Keflex she will need to report to her PCP , urgent Care or ER for in person assessment.    Follow Up Instructions: I discussed the assessment and treatment plan with the patient. The patient was provided an opportunity to ask questions and all were answered. The patient agreed with the plan and demonstrated an understanding of the instructions.  A copy of instructions were sent to the  patient via MyChart unless otherwise noted below.     The patient was advised to call back or seek an in-person evaluation if the symptoms worsen or if the condition fails to improve as anticipated.    Laure Kidney, PA-C

## 2023-10-03 ENCOUNTER — Ambulatory Visit
Admission: RE | Admit: 2023-10-03 | Discharge: 2023-10-03 | Disposition: A | Discharge status: Home / Self Care | Source: Ambulatory Visit | Attending: Emergency Medicine | Admitting: Emergency Medicine

## 2023-10-03 VITALS — BP 124/70 | HR 74 | Temp 98.1°F | Resp 18

## 2023-10-03 DIAGNOSIS — N390 Urinary tract infection, site not specified: Secondary | ICD-10-CM | POA: Diagnosis present

## 2023-10-03 DIAGNOSIS — R319 Hematuria, unspecified: Secondary | ICD-10-CM | POA: Diagnosis not present

## 2023-10-03 LAB — POCT URINALYSIS DIP (MANUAL ENTRY)
Bilirubin, UA: NEGATIVE
Glucose, UA: 100 mg/dL — AB
Nitrite, UA: POSITIVE — AB
Protein Ur, POC: 100 mg/dL — AB
Spec Grav, UA: 1.015 (ref 1.010–1.025)
Urobilinogen, UA: 2 U/dL — AB
pH, UA: 5.5 (ref 5.0–8.0)

## 2023-10-03 MED ORDER — NITROFURANTOIN MONOHYD MACRO 100 MG PO CAPS: 100.0000 mg | ORAL_CAPSULE | Freq: Two times a day (BID) | ORAL | 0 refills | Status: DC

## 2023-10-03 NOTE — ED Triage Notes (Signed)
 Patient to Urgent Care with complaints of urinary frequency/ dysuria that started this morning.  Taking azo.

## 2023-10-03 NOTE — ED Provider Notes (Signed)
 Renaldo Fiddler    CSN: [id number] Arrival date & time: 10/03/23  1417      History   Chief Complaint Chief Complaint  Patient presents with   Urinary Frequency    Probably a uti but need labs to be sure. - Entered by patient    HPI Shannon [name] is a 73 y.o. female.  Patient presents with dysuria and urinary frequency since this morning.  She took 1 dose of Azo today.  No fever, abdominal pain, hematuria, flank pain.  Her symptoms started a couple of days after intercourse.  She reports history of recurrent UTIs and states she has been evaluated by urology in the past; last seen 10/31/2022.  Patient had an e-visit on 09/14/2023 for recurrent UTI; treated with cephalexin.  She was seen at [facility] on 07/31/2023 and treated with nitrofurantoin.  The history is provided by the patient and medical records.    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   Cystitis 07/31/2021   Stress incontinence 11/08/2020   Stress 11/08/2020   Skin exam, screening for cancer 11/08/2020   Erythematous rash 10/15/2018   Ganglion cyst of finger 09/24/2018   Eczema of scalp 04/03/2017   Hair loss 06/02/2014   Routine general medical examination at a health care facility 11/24/2013   Hyperlipidemia 05/20/2013   Hypothyroidism 05/20/2013   Osteoporosis 05/20/2013   Thyroid nodule 05/20/2013   Carpal tunnel syndrome 02/10/2013   Elevated TSH 02/10/2013    Past Surgical History:  Procedure Laterality Date   HAND SURGERY     40 yrs ago    OB History   No obstetric history on file.      Home Medications    Prior to Admission  medications   Medication Sig Start Date End Date Taking? Authorizing Provider  nitrofurantoin, macrocrystal-monohydrate, (MACROBID) 100 MG capsule Take 1 capsule (100 mg total) by mouth 2 (two) times daily. 10/03/23  Yes Mickie Bail, NP  b complex vitamins capsule Take by mouth.    [provider]  Calcium Carb-Cholecalciferol 600-10 MG-MCG TABS     [provider]  Cholecalciferol (D3 HIGH POTENCY) 125 MCG (5000 UT) capsule     [provider]  Cranberry-Vitamin C-Vitamin E (CRANBERRY PLUS VITAMIN C) 4200-20-3 MG-MG-UNIT CAPS     [provider]  estradiol (ESTRACE) 0.1 MG/GM vaginal cream 1 gram intravaginally at bedtime daily for 2-3 weeks, then decrease to 3 times per week 08/04/21   Vanna Scotland, MD  levothyroxine (SYNTHROID) 88 MCG tablet Take 1 tablet (88 mcg total) by mouth daily. 05/06/23   Carlus Pavlov, MD  Multiple Vitamins-Minerals (CENTRUM SILVER PO) Take 1 tablet by mouth daily.    [provider]  predniSONE (DELTASONE) 20 MG tablet Take 2 tablets (40 mg total) by mouth daily with breakfast. Patient not taking: Reported on 10/03/2023 07/31/23   Glori Luis, MD  Probiotic Product (PROBIOTIC-10 ULTIMATE PO) Take by mouth.    [provider]  simvastatin (ZOCOR) 20 MG tablet TAKE ONE TABLET BY MOUTH EVERY EVENING 12/19/22   Glori Luis, MD  Family History Family History  Problem Relation Age of Onset   Breast cancer Mother 81   Heart disease Father    Cancer Father        Prostate   Diabetes Father        diagnosed later in life   Prostate cancer Father    Colon polyps Father 85   Parkinson's disease Father    Congestive Heart Failure Father    Asthma Brother    Colon cancer Neg Hx    Rectal cancer Neg Hx    Stomach cancer Neg Hx    Esophageal cancer Neg Hx     Social History Social History   Tobacco Use   Smoking status: Former    Current packs/day: 0.00    Types: Cigarettes    Quit date:  01/18/1989    Years since quitting: 34.7   Smokeless tobacco: Never  Vaping Use   Vaping status: Never Used  Substance Use Topics   Alcohol use: Yes    Alcohol/week: 7.0 standard drinks of alcohol    Types: 7 Glasses of wine per week   Drug use: No     Allergies   Patient has no known allergies.   Review of Systems Review of Systems  Constitutional:  Negative for chills and fever.  Gastrointestinal:  Negative for abdominal pain, constipation, diarrhea, nausea and vomiting.  Genitourinary:  Positive for dysuria and frequency. Negative for flank pain and hematuria.     Physical Exam Triage Vital Signs ED Triage Vitals  Encounter Vitals Group     BP      Systolic BP Percentile      Diastolic BP Percentile      Pulse      Resp      Temp      Temp src      SpO2      Weight      Height      Head Circumference      Peak Flow      Pain Score      Pain Loc      Pain Education      Exclude from Growth Chart    No data found.  Updated Vital Signs BP 124/70   Pulse 74   Temp 98.1 F (36.7 C)   Resp 18   SpO2 96%   Visual Acuity Right Eye Distance:   Left Eye Distance:   Bilateral Distance:    Right Eye Near:   Left Eye Near:    Bilateral Near:     Physical Exam Constitutional:      General: She is not in acute distress. HENT:     Mouth/Throat:     Mouth: Mucous membranes are moist.  Cardiovascular:     Rate and Rhythm: Normal rate and regular rhythm.  Pulmonary:     Effort: Pulmonary effort is normal. No respiratory distress.  Abdominal:     General: Bowel sounds are normal.     Palpations: Abdomen is soft.     Tenderness: There is no abdominal tenderness. There is no right CVA tenderness, left CVA tenderness, guarding or rebound.  Neurological:     Mental Status: She is alert.      UC Treatments / Results  Labs (all labs ordered are listed, but only abnormal results are displayed) Labs Reviewed  POCT URINALYSIS DIP (MANUAL ENTRY) -  Abnormal; Notable for the following components:      Result Value   Color, UA orange (*)  Clarity, UA cloudy (*)    Glucose, UA =100 (*)    Ketones, POC UA trace (5) (*)    Blood, UA trace-intact (*)    Protein Ur, POC =100 (*)    Urobilinogen, UA 2.0 (*)    Nitrite, UA Positive (*)    Leukocytes, UA Large (3+) (*)    All other components within normal limits  URINE CULTURE    EKG   Radiology No results found.  Procedures Procedures (including critical care time)  Medications Ordered in UC Medications - No data to display  Initial Impression / Assessment and Plan / UC Course  I have reviewed the triage vital signs and the nursing notes.  Pertinent labs & imaging results that were available during my care of the patient were reviewed by me and considered in my medical decision making (see chart for details).    UTI.  Patient recently completed a course of cephalexin.  Based on most recent urine culture available in chart (10/31/2022), treating with Macrobid. Urine culture pending. Discussed with patient that we will call her if the urine culture shows the need to change or discontinue the antibiotic. Instructed her to follow-up with her PCP.  She agrees to plan of care.     Final Clinical Impressions(s) / UC Diagnoses   Final diagnoses:  Urinary tract infection with hematuria, site unspecified     Discharge Instructions      Take the antibiotic as directed.  The urine culture is pending.  We will call you if it shows the need to change or discontinue your antibiotic.    Follow up with your primary care provider.        ED Prescriptions     Medication Sig Dispense Auth. Provider   nitrofurantoin, macrocrystal-monohydrate, (MACROBID) 100 MG capsule Take 1 capsule (100 mg total) by mouth 2 (two) times daily. 10 capsule Mickie Bail, NP      PDMP not reviewed this encounter.   Mickie Bail, NP 10/03/23 [phone]

## 2023-10-03 NOTE — Discharge Instructions (Addendum)
Take the antibiotic as directed.  The urine culture is pending.  We will call you if it shows the need to change or discontinue your antibiotic.    Follow up with your primary care provider.    

## 2023-10-05 LAB — URINE CULTURE: Culture: 20000 — AB

## 2023-10-06 ENCOUNTER — Encounter: Payer: Self-pay | Admitting: Family Medicine

## 2023-10-19 ENCOUNTER — Encounter: Payer: Self-pay | Admitting: Family Medicine

## 2023-10-23 ENCOUNTER — Ambulatory Visit (INDEPENDENT_AMBULATORY_CARE_PROVIDER_SITE_OTHER): Admitting: Family Medicine

## 2023-10-23 ENCOUNTER — Encounter: Payer: Self-pay | Admitting: Family Medicine

## 2023-10-23 VITALS — BP 120/64 | HR 68 | Temp 97.9°F | Resp 20 | Ht 65.0 in | Wt 150.4 lb

## 2023-10-23 DIAGNOSIS — R7309 Other abnormal glucose: Secondary | ICD-10-CM | POA: Diagnosis not present

## 2023-10-23 DIAGNOSIS — E785 Hyperlipidemia, unspecified: Secondary | ICD-10-CM

## 2023-10-23 DIAGNOSIS — E538 Deficiency of other specified B group vitamins: Secondary | ICD-10-CM

## 2023-10-23 DIAGNOSIS — R3989 Other symptoms and signs involving the genitourinary system: Secondary | ICD-10-CM

## 2023-10-23 DIAGNOSIS — R8290 Unspecified abnormal findings in urine: Secondary | ICD-10-CM

## 2023-10-23 DIAGNOSIS — R21 Rash and other nonspecific skin eruption: Secondary | ICD-10-CM

## 2023-10-23 DIAGNOSIS — R7989 Other specified abnormal findings of blood chemistry: Secondary | ICD-10-CM | POA: Diagnosis not present

## 2023-10-23 DIAGNOSIS — R768 Other specified abnormal immunological findings in serum: Secondary | ICD-10-CM

## 2023-10-23 DIAGNOSIS — N390 Urinary tract infection, site not specified: Secondary | ICD-10-CM

## 2023-10-23 DIAGNOSIS — E559 Vitamin D deficiency, unspecified: Secondary | ICD-10-CM

## 2023-10-23 DIAGNOSIS — R7689 Other specified abnormal immunological findings in serum: Secondary | ICD-10-CM

## 2023-10-23 LAB — POCT URINALYSIS DIPSTICK
Bilirubin, UA: NEGATIVE
Glucose, UA: NEGATIVE
Ketones, UA: NEGATIVE
Nitrite, UA: NEGATIVE
Protein, UA: POSITIVE — AB
Spec Grav, UA: >=1.03 — AB (ref 1.010–1.025)
Urobilinogen, UA: 0.2 U/dL
pH, UA: 6 (ref 5.0–8.0)

## 2023-10-23 LAB — URINALYSIS, ROUTINE W REFLEX MICROSCOPIC
Bilirubin Urine: NEGATIVE
Ketones, ur: NEGATIVE
Nitrite: NEGATIVE
Specific Gravity, Urine: 1.025 (ref 1.000–1.030)
Total Protein, Urine: 100 — AB
Urine Glucose: NEGATIVE
Urobilinogen, UA: 0.2 (ref 0.0–1.0)
pH: 6 (ref 5.0–8.0)

## 2023-10-23 NOTE — Patient Instructions (Signed)
 It was a pleasure meeting you today. Thank you for allowing me to take part in your health care.  Our goals for today as we discussed include:  Urine positive for some blood and leukocytes Sending for culture If having any fevers, belly pain, back pain please notify MD if results from culture not back  Will send in prescription if culture positive  Recommend CT Renal Study to evaluate for Kidney stones if not previously done.  Will send message if needing this  Schedule fasting lab appointment.  Fast 10 hrs prior to blood draw  Schedule Annual in May   This is a list of the screening recommended for you and due dates:  Health Maintenance  Topic Date Due   COVID-19 Vaccine (9 - 2024-25 season) 10/07/2023   Medicare Annual Wellness Visit  01/29/2024   Mammogram  02/04/2024   Flu Shot  02/21/2024   Colon Cancer Screening  12/01/2026   DTaP/Tdap/Td vaccine (5 - Td or Tdap) 11/21/2031   Pneumonia Vaccine  Completed   DEXA scan (bone density measurement)  Completed   Hepatitis C Screening  Completed   Zoster (Shingles) Vaccine  Completed   HPV Vaccine  Aged Out     If you have any questions or concerns, please do not hesitate to call the office at [phone].  I look forward to our next visit and until then take care and stay safe.  Regards,   Dana Allan, MD   [facility]

## 2023-10-23 NOTE — Progress Notes (Signed)
 SUBJECTIVE:   Chief Complaint  Patient presents with   Urinary Tract Infection    Burning X this am   HPI Presents for acute visit  Discussed the use of AI scribe software for clinical note transcription with the patient, who gave verbal consent to proceed.  History of Present Illness Shannon [name] is a 73 year old female who presents with a new onset urinary tract infection and facial rash.  She has a new onset urinary tract infection (UTI) that began in the middle of the night prior to the visit. This is an ongoing issue, as she had a UTI approximately two weeks ago, treated with Macrobid. After completing Macrobid, she underwent oral surgery and was prescribed amoxicillin, which she completed two days ago. The UTI symptoms returned shortly after finishing the amoxicillin. She has been on antibiotics for about a month and is frustrated with the recurrence of her symptoms. She has a history of being referred to a urologist and having undergone a cystoscopy, which showed no abnormalities. She has not had any kidney stones in the past. No fever or back pain is present, and her temperature is normal. She experiences difficulty urinating and urgency, and inquires about taking AZO for relief.  She reports a rash on her face, which is a new occurrence this year. The rash flares up, causing redness, particularly on the right side of her face. She associates the rash with alcohol consumption, noting a recent flare-up after drinking a martini. She has tried different skincare products, including medical-grade products and [employer], but the rash persists intermittently. She has not been on prednisone during these episodes and has not had any injections.  She expresses concern about her liver health, mentioning a past incident where her liver function tests were abnormal, but subsequent tests and an ultrasound were normal. She drinks two glasses of wine daily, typically red wine, and is worried about  the impact of alcohol on her liver enzymes.    PERTINENT PMH / PSH: As above  OBJECTIVE:  BP 120/64   Pulse 68   Temp 97.9 F (36.6 C)   Resp 20   Ht 5\' 5"  (1.651 m)   Wt 150 lb 6 oz (68.2 kg)   SpO2 99%   BMI 25.02 kg/m    Physical Exam Vitals reviewed.  Constitutional:      General: She is not in acute distress.    Appearance: Normal appearance. She is normal weight. She is not ill-appearing, toxic-appearing or diaphoretic.  Eyes:     General:        Right eye: No discharge.        Left eye: No discharge.     Conjunctiva/sclera: Conjunctivae normal.  Cardiovascular:     Rate and Rhythm: Normal rate.  Pulmonary:     Effort: Pulmonary effort is normal.  Abdominal:     Tenderness: There is no abdominal tenderness. There is no right CVA tenderness or left CVA tenderness.  Musculoskeletal:        General: Normal range of motion.  Skin:    General: Skin is warm and dry.     Findings: No rash.  Neurological:     General: No focal deficit present.     Mental Status: She is alert and oriented to person, place, and time. Mental status is at baseline.  Psychiatric:        Mood and Affect: Mood normal.        Behavior: Behavior normal.  Thought Content: Thought content normal.        Judgment: Judgment normal.           10/23/2023    8:10 AM 01/29/2023    9:30 AM 11/28/2022    1:49 PM 11/17/2021    3:38 PM 11/10/2021    9:05 AM  Depression screen PHQ 2/9  Decreased Interest 0 0 0 0 0  Down, Depressed, Hopeless 0 0 0 0 0  PHQ - 2 Score 0 0 0 0 0  Altered sleeping 1 1 0    Tired, decreased energy 0 0 0    Change in appetite 0 0 0    Feeling bad or failure about yourself  0 0 0    Trouble concentrating 0 0 0    Moving slowly or fidgety/restless 0 0 0    Suicidal thoughts 0 0 0    PHQ-9 Score 1 1 0    Difficult doing work/chores Not difficult at all Not difficult at all Not difficult at all        10/23/2023    8:58 AM 11/28/2022    1:49 PM  GAD 7 :  Generalized Anxiety Score  Nervous, Anxious, on Edge 1 0  Control/stop worrying 0 0  Worry too much - different things 0 0  Trouble relaxing 0 0  Restless 0 0  Easily annoyed or irritable 0 0  Afraid - awful might happen 0 0  Total GAD 7 Score 1 0  Anxiety Difficulty Not difficult at all Not difficult at all    ASSESSMENT/PLAN:  Recurrent UTI Assessment & Plan: Recurrent UTI likely due to E. coli ESBL. Symptoms include dysuria and urgency. Urinalysis shows leukocytes and hematuria. Culture pending for confirmation and sensitivity. Discussed stronger antibiotics and risk of resistance. Considered estrogen treatment for prevention. - Send urine culture for confirmation and sensitivity. - Advise increased fluid intake and cranberry juice. - Allow phenazopyridine for symptom relief. - Await culture results before prescribing antibiotics. - Consider urology or infectious disease referral if recurrent UTIs persist. - Discuss potential estrogen-based treatment for urethral application.  Orders: -     POCT urinalysis dipstick -     Urine Culture -     Urinalysis, Routine w reflex microscopic  Abnormal glucose -     Hemoglobin A1c; Future  LFTs abnormal Assessment & Plan: Requesting labs for recheck.  Etoh intermittently, 2 glasses of wine at night.  - Discussed alcohol's impact on liver enzymes. - Advise avoiding alcohol before blood work. - Schedule blood work for a day without alcohol consumption.   Hyperlipidemia, unspecified hyperlipidemia type -     Lipid panel; Future -     Comprehensive metabolic panel with GFR; Future  Vitamin D deficiency -     VITAMIN D 25 Hydroxy (Vit-D Deficiency, Fractures); Future  Vitamin B 12 deficiency -     Vitamin B12; Future  Erythematous rash Assessment & Plan: Intermittent facial rash possibly linked to alcohol or antibiotics. Resolved at visit. Differential includes allergic reaction or autoimmune process.  - Order blood work to  assess underlying causes.    Orders: -     CBC with Differential/Platelet; Future -     Sedimentation rate; Future -     C-reactive protein; Future -     Rheumatoid factor; Future -     ANA; Future    Addendum 04/03 Continues to have UTI symptoms.  Culture still pending Start Macrobid 100 mg BID  04/04 Urine culture negative  for infection Recommend discontinuing Macrobid Continue with estrogen cream as previously prescribed by OBGYN.  Follow up with Urology     PDMP reviewed  Return if symptoms worsen or fail to improve, for PCP.  Dana Allan, MD

## 2023-10-24 ENCOUNTER — Other Ambulatory Visit: Payer: Self-pay | Admitting: Family Medicine

## 2023-10-24 ENCOUNTER — Encounter: Payer: Self-pay | Admitting: Family Medicine

## 2023-10-24 DIAGNOSIS — N390 Urinary tract infection, site not specified: Secondary | ICD-10-CM

## 2023-10-24 LAB — URINE CULTURE
MICRO NUMBER:: 16279650
SPECIMEN QUALITY:: ADEQUATE

## 2023-10-24 MED ORDER — NITROFURANTOIN MONOHYD MACRO 100 MG PO CAPS: 100.0000 mg | ORAL_CAPSULE | Freq: Two times a day (BID) | ORAL | 0 refills | Status: AC

## 2023-10-25 ENCOUNTER — Other Ambulatory Visit (INDEPENDENT_AMBULATORY_CARE_PROVIDER_SITE_OTHER)

## 2023-10-25 DIAGNOSIS — E538 Deficiency of other specified B group vitamins: Secondary | ICD-10-CM

## 2023-10-25 DIAGNOSIS — E559 Vitamin D deficiency, unspecified: Secondary | ICD-10-CM | POA: Diagnosis not present

## 2023-10-25 DIAGNOSIS — E785 Hyperlipidemia, unspecified: Secondary | ICD-10-CM | POA: Diagnosis not present

## 2023-10-25 DIAGNOSIS — R7309 Other abnormal glucose: Secondary | ICD-10-CM | POA: Diagnosis not present

## 2023-10-25 DIAGNOSIS — R21 Rash and other nonspecific skin eruption: Secondary | ICD-10-CM | POA: Diagnosis not present

## 2023-10-25 LAB — CBC WITH DIFFERENTIAL/PLATELET
Basophils Absolute: 0 10*3/uL (ref 0.0–0.1)
Basophils Relative: 0.6 % (ref 0.0–3.0)
Eosinophils Absolute: 0.1 10*3/uL (ref 0.0–0.7)
Eosinophils Relative: 2.2 % (ref 0.0–5.0)
HCT: 37.1 % (ref 36.0–46.0)
Hemoglobin: 12.8 g/dL (ref 12.0–15.0)
Lymphocytes Relative: 21.1 % (ref 12.0–46.0)
Lymphs Abs: 1 10*3/uL (ref 0.7–4.0)
MCHC: 34.4 g/dL (ref 30.0–36.0)
MCV: 100.1 fl — ABNORMAL HIGH (ref 78.0–100.0)
Monocytes Absolute: 0.4 10*3/uL (ref 0.1–1.0)
Monocytes Relative: 8.4 % (ref 3.0–12.0)
Neutro Abs: 3.1 10*3/uL (ref 1.4–7.7)
Neutrophils Relative %: 67.7 % (ref 43.0–77.0)
Platelets: 290 10*3/uL (ref 150.0–400.0)
RBC: 3.7 Mil/uL — ABNORMAL LOW (ref 3.87–5.11)
RDW: 13 % (ref 11.5–15.5)
WBC: 4.6 10*3/uL (ref 4.0–10.5)

## 2023-10-25 LAB — COMPREHENSIVE METABOLIC PANEL WITH GFR
ALT: 32 U/L (ref 0–35)
AST: 31 U/L (ref 0–37)
Albumin: 4.8 g/dL (ref 3.5–5.2)
Alkaline Phosphatase: 55 U/L (ref 39–117)
BUN: 15 mg/dL (ref 6–23)
CO2: 25 meq/L (ref 19–32)
Calcium: 10.1 mg/dL (ref 8.4–10.5)
Chloride: 101 meq/L (ref 96–112)
Creatinine, Ser: 0.76 mg/dL (ref 0.40–1.20)
GFR: 78.13 mL/min (ref >60.00)
Glucose, Bld: 99 mg/dL (ref 70–99)
Potassium: 4.1 meq/L (ref 3.5–5.1)
Sodium: 137 meq/L (ref 135–145)
Total Bilirubin: 1.2 mg/dL (ref 0.2–1.2)
Total Protein: 7.4 g/dL (ref 6.0–8.3)

## 2023-10-25 LAB — C-REACTIVE PROTEIN: CRP: <1 mg/dL (ref 0.5–20.0)

## 2023-10-25 LAB — LIPID PANEL
Cholesterol: 190 mg/dL (ref 0–200)
HDL: 61.3 mg/dL (ref >39.00)
LDL Cholesterol: 86 mg/dL (ref 0–99)
NonHDL: 129.05
Total CHOL/HDL Ratio: 3
Triglycerides: 214 mg/dL — ABNORMAL HIGH (ref 0.0–149.0)
VLDL: 42.8 mg/dL — ABNORMAL HIGH (ref 0.0–40.0)

## 2023-10-25 LAB — VITAMIN B12: Vitamin B-12: 751 pg/mL (ref 211–911)

## 2023-10-25 LAB — SEDIMENTATION RATE: Sed Rate: 16 mm/h (ref 0–30)

## 2023-10-25 LAB — HEMOGLOBIN A1C: Hgb A1c MFr Bld: 5.6 % (ref 4.6–6.5)

## 2023-10-25 LAB — VITAMIN D 25 HYDROXY (VIT D DEFICIENCY, FRACTURES): VITD: 52.93 ng/mL (ref 30.00–100.00)

## 2023-10-27 ENCOUNTER — Encounter: Payer: Self-pay | Admitting: Family Medicine

## 2023-10-27 NOTE — Assessment & Plan Note (Signed)
 Intermittent facial rash possibly linked to alcohol or antibiotics. Resolved at visit. Differential includes allergic reaction or autoimmune process.  - Order blood work to assess underlying causes.

## 2023-10-27 NOTE — Assessment & Plan Note (Signed)
 Recurrent UTI likely due to E. coli ESBL. Symptoms include dysuria and urgency. Urinalysis shows leukocytes and hematuria. Culture pending for confirmation and sensitivity. Discussed stronger antibiotics and risk of resistance. Considered estrogen treatment for prevention. - Send urine culture for confirmation and sensitivity. - Advise increased fluid intake and cranberry juice. - Allow phenazopyridine for symptom relief. - Await culture results before prescribing antibiotics. - Consider urology or infectious disease referral if recurrent UTIs persist. - Discuss potential estrogen-based treatment for urethral application.

## 2023-10-27 NOTE — Assessment & Plan Note (Addendum)
 Requesting labs for recheck.  Etoh intermittently, 2 glasses of wine at night.  - Discussed alcohol's impact on liver enzymes. - Advise avoiding alcohol before blood work. - Schedule blood work for a day without alcohol consumption.

## 2023-10-28 ENCOUNTER — Other Ambulatory Visit: Payer: Self-pay | Admitting: Family Medicine

## 2023-10-28 ENCOUNTER — Other Ambulatory Visit: Payer: Self-pay

## 2023-10-28 DIAGNOSIS — R768 Other specified abnormal immunological findings in serum: Secondary | ICD-10-CM

## 2023-10-28 DIAGNOSIS — R21 Rash and other nonspecific skin eruption: Secondary | ICD-10-CM

## 2023-10-28 LAB — ANTI-NUCLEAR AB-TITER (ANA TITER): ANA Titer 1: 1:80 {titer} — ABNORMAL HIGH

## 2023-10-28 LAB — ANA: Anti Nuclear Antibody (ANA): POSITIVE — AB

## 2023-10-28 LAB — RHEUMATOID FACTOR: Rheumatoid fact SerPl-aCnc: <10 [IU]/mL (ref <14)

## 2023-10-28 MED ORDER — SIMVASTATIN 40 MG PO TABS: 40.0000 mg | ORAL_TABLET | Freq: Every day | ORAL | 3 refills | Status: DC

## 2023-10-31 ENCOUNTER — Other Ambulatory Visit: Payer: Self-pay | Admitting: Family Medicine

## 2023-10-31 MED ORDER — ESTRADIOL 0.1 MG/GM VA CREA: TOPICAL_CREAM | VAGINAL | 12 refills | Status: DC

## 2023-10-31 NOTE — Addendum Note (Signed)
 Addended by: Vertis Kelch on: 10/31/2023 02:20 PM   Modules accepted: Orders

## 2023-11-07 NOTE — Addendum Note (Signed)
 Addended by: Barb Levers on: 11/07/2023 04:04 PM   Modules accepted: Orders

## 2023-11-07 NOTE — Telephone Encounter (Signed)
 Referral has been placed for [facility].

## 2023-11-19 ENCOUNTER — Telehealth: Payer: Self-pay | Admitting: *Deleted

## 2023-11-19 NOTE — Telephone Encounter (Signed)
 I spoke with patient and scheduled a TOC appointment for her with Dr. Casimir Cleaver on 07/31/2024.  Patient also asked about the second referral we sent to Rheumatology.  I let her know that the referral has been sent to [facility] and gave her the phone number so she can call to schedule an appointment.

## 2023-11-19 NOTE — Telephone Encounter (Signed)
 Please call and schedule TOC with  Dr. Casimir Cleaver.

## 2023-11-21 ENCOUNTER — Encounter: Payer: Self-pay | Admitting: Urology

## 2023-11-21 ENCOUNTER — Ambulatory Visit: Admitting: Urology

## 2023-11-21 VITALS — BP 121/73 | HR 74

## 2023-11-21 DIAGNOSIS — R3129 Other microscopic hematuria: Secondary | ICD-10-CM | POA: Diagnosis not present

## 2023-11-21 DIAGNOSIS — N3090 Cystitis, unspecified without hematuria: Secondary | ICD-10-CM | POA: Diagnosis not present

## 2023-11-21 LAB — MICROSCOPIC EXAMINATION
Epithelial Cells (non renal): >10 /HPF — AB (ref 0–10)
WBC, UA: >30 /HPF — AB (ref 0–5)

## 2023-11-21 LAB — URINALYSIS, COMPLETE

## 2023-11-21 MED ORDER — CEFUROXIME AXETIL 500 MG PO TABS: 500.0000 mg | ORAL_TABLET | Freq: Two times a day (BID) | ORAL | 0 refills | Status: DC

## 2023-11-21 NOTE — Progress Notes (Signed)
 11/21/2023 5:28 PM   Shannon [name] Jan 07, 1951 [id number]  Referring provider: No referring provider defined for this encounter.  Urological history: 1. SUI - seen PT in the past  2. rUTI's - Less than 10,000 colonies October 23, 2023 - E. Coli October 03, 2023 - Vaginal estrogen cream, cranberry tablets and probiotics  3.  High risk hematuria - former smoker - CTU (10/2022) - benign cysts - cysto (11/2022) -urethral caruncle  Chief Complaint  Patient presents with   Follow-up   HPI: Shannon [name] is a 73 y.o. woman who presents today for possible UTI.  Previous records reviewed.   She seen in the [facility] health urgent care on October 03, 2023 with reports of dysuria and urinary frequency.  She was controlling her symptoms with AZO.   Urine dip was nitrite positive, trace heme and large leukocytes.  Urine culture grew out E. Coli.  She was given 5 days of Macrobid .   She also was prescribed amoxicillin after oral surgery.  Her symptoms did abate with the Macrobid  and amoxicillin, but they returned 2 days later.  She was prescribed Macrobid  again.  Her UA with pyuria, hematuria and rare bacteria.  Urine culture grew out less than 10,000 colonies.  She was then instructed to follow-up with urology.  She continues to have frequency, urgency and dysuria.  Patient denies any modifying or aggravating factors.  Patient denies any recent UTI's, gross hematuria or suprapubic/flank pain.  Patient denies any fevers, chills, nausea or vomiting.    UA dip could not be calculated due to Pyridium , greater than 30 WBCs, 11-30 RBCs, greater than 10 epithelial cells and moderate bacteria.    PMH: Past Medical History:  Diagnosis Date   GERD (gastroesophageal reflux disease)    Hyperlipidemia    Osteoporosis    now has osteopenia   Thyroid  disease     Surgical History: Past Surgical History:  Procedure Laterality Date   HAND SURGERY     40 yrs ago    Home Medications:  Allergies as  of 11/21/2023   No Known Allergies      Medication List        Accurate as of Nov 21, 2023  5:28 PM. If you have any questions, ask your nurse or doctor.          b complex vitamins capsule Take by mouth.   Calcium Carb-Cholecalciferol 600-10 MG-MCG Tabs   cefUROXime  500 MG tablet Commonly known as: CEFTIN  Take 1 tablet (500 mg total) by mouth 2 (two) times daily with a meal.   CENTRUM SILVER PO Take 1 tablet by mouth daily.   Cranberry Plus Vitamin C 4200-20-3 MG-MG-UNIT Caps Generic drug: Cranberry-Vitamin C-Vitamin E   D3 High Potency 125 MCG (5000 UT) capsule Generic drug: Cholecalciferol   estradiol  0.1 MG/GM vaginal cream Commonly known as: ESTRACE  1 gram intravaginally at bedtime daily for 2-3 weeks, then decrease to 3 times per week   levothyroxine  88 MCG tablet Commonly known as: SYNTHROID  Take 1 tablet (88 mcg total) by mouth daily.   PROBIOTIC-10 ULTIMATE PO Take by mouth.   simvastatin  40 MG tablet Commonly known as: ZOCOR  Take 1 tablet (40 mg total) by mouth at bedtime.        Allergies: No Known Allergies  Family History: Family History  Problem Relation Age of Onset   Breast cancer Mother 85   Heart disease Father    Cancer Father        Prostate  Diabetes Father        diagnosed later in life   Prostate cancer Father    Colon polyps Father 42   Parkinson's disease Father    Congestive Heart Failure Father    Asthma Brother    Colon cancer Neg Hx    Rectal cancer Neg Hx    Stomach cancer Neg Hx    Esophageal cancer Neg Hx     Social History:  reports that she quit smoking about 34 years ago. Her smoking use included cigarettes. She has never used smokeless tobacco. She reports current alcohol use of about 7.0 standard drinks of alcohol per week. She reports that she does not use drugs.  ROS: Pertinent ROS in HPI  Physical Exam: BP 121/73   Pulse 74   Constitutional:  Well nourished. Alert and oriented, No acute  distress. HEENT: [place] AT, moist mucus membranes.  Trachea midline Cardiovascular: No clubbing, cyanosis, or edema. Respiratory: Normal respiratory effort, no increased work of breathing. Neurologic: Grossly intact, no focal deficits, moving all 4 extremities. Psychiatric: Normal mood and affect.  Laboratory Data: Lab Results  Component Value Date   WBC 4.6 10/25/2023   HGB 12.8 10/25/2023   HCT 37.1 10/25/2023   MCV 100.1 (H) 10/25/2023   PLT 290.0 10/25/2023    Lab Results  Component Value Date   CREATININE 0.76 10/25/2023    Lab Results  Component Value Date   HGBA1C 5.6 10/25/2023    Lab Results  Component Value Date   TSH 3.32 05/06/2023       Component Value Date/Time   CHOL 190 10/25/2023 1052   HDL 61.30 10/25/2023 1052   CHOLHDL 3 10/25/2023 1052   VLDL 42.8 (H) 10/25/2023 1052   LDLCALC 86 10/25/2023 1052    Lab Results  Component Value Date   AST 31 10/25/2023   Lab Results  Component Value Date   ALT 32 10/25/2023   Urinalysis See EPIC and HPI  I have reviewed the labs.   Pertinent Imaging: N/A  Assessment & Plan:    1. Microscopic hematuria (Primary) - Urinalysis, Complete -with micro heme - Will recheck when she returns in 2 months to ensure it clears with the treatment of her infection  2. Recurrent cystitis - Urinalysis, Complete - grossly infected - Ceftin  500 mg twice daily x 7 days - Urine culture pending - Patient will be leaving for [place] and like to have a prescription for antibiotics on hand for her trip, so we will wait on the sensitivities when they result next week and I will prescribe a 7-day course of antibiotics for her to have on hand for her trip - Encouraged her to continue her vaginal estrogen cream 3 nights weekly as it has been shown to reduce recurrent urinary tract infections - We may discuss a course of prophylactic antibiotics when she returns in 2 months  Return in about 2 months (around 01/21/2024) for repeat  UA and symptom recheck .  These notes generated with voice recognition software. I apologize for typographical errors.  Briant Camper  [facility] [address]  [address] [place], [place] [address] [phone]

## 2023-11-26 ENCOUNTER — Encounter: Payer: Self-pay | Admitting: Urology

## 2023-11-26 ENCOUNTER — Other Ambulatory Visit: Payer: Self-pay | Admitting: Urology

## 2023-11-26 LAB — CULTURE, URINE COMPREHENSIVE

## 2023-11-26 MED ORDER — CIPROFLOXACIN HCL 500 MG PO TABS: 500.0000 mg | ORAL_TABLET | Freq: Two times a day (BID) | ORAL | 0 refills | Status: AC

## 2023-12-02 ENCOUNTER — Encounter: Payer: PPO | Admitting: Family Medicine

## 2023-12-05 ENCOUNTER — Encounter: Payer: PPO | Admitting: Family Medicine

## 2023-12-16 ENCOUNTER — Encounter: Payer: PPO | Admitting: Family Medicine

## 2023-12-23 ENCOUNTER — Encounter: Payer: Self-pay | Admitting: Family Medicine

## 2023-12-23 ENCOUNTER — Ambulatory Visit (INDEPENDENT_AMBULATORY_CARE_PROVIDER_SITE_OTHER): Admitting: Family Medicine

## 2023-12-23 VITALS — BP 110/64 | HR 67 | Temp 98.1°F | Resp 20 | Ht 64.75 in | Wt 147.2 lb

## 2023-12-23 DIAGNOSIS — Z1231 Encounter for screening mammogram for malignant neoplasm of breast: Secondary | ICD-10-CM

## 2023-12-23 DIAGNOSIS — N390 Urinary tract infection, site not specified: Secondary | ICD-10-CM

## 2023-12-23 DIAGNOSIS — R21 Rash and other nonspecific skin eruption: Secondary | ICD-10-CM

## 2023-12-23 DIAGNOSIS — E785 Hyperlipidemia, unspecified: Secondary | ICD-10-CM | POA: Diagnosis not present

## 2023-12-23 DIAGNOSIS — Z0000 Encounter for general adult medical examination without abnormal findings: Secondary | ICD-10-CM | POA: Diagnosis not present

## 2023-12-23 NOTE — Patient Instructions (Addendum)
 It was a pleasure meeting you today. Thank you for allowing me to take part in your health care.  Our goals for today as we discussed include:  There are refills for medications at your pharmacy until you have your visit with your PCP  Your weight is down 3 lbs since your las visit. Continue to increase activity and healthy eating. BMI is ~24 and normal  Referral sent for Mammogram. Please call to schedule appointment. [facility] [address] [place], [place] [address] [phone]     This is a list of the screening recommended for you and due dates:  Health Maintenance  Topic Date Due   COVID-19 Vaccine (9 - 2024-25 season) 10/07/2023   Medicare Annual Wellness Visit  01/29/2024   Mammogram  02/04/2024   Flu Shot  02/21/2024   Colon Cancer Screening  12/01/2026   DTaP/Tdap/Td vaccine (5 - Td or Tdap) 11/21/2031   Pneumonia Vaccine  Completed   DEXA scan (bone density measurement)  Completed   Hepatitis C Screening  Completed   Zoster (Shingles) Vaccine  Completed   HPV Vaccine  Aged Out   Meningitis B Vaccine  Aged Out      If you have any questions or concerns, please do not hesitate to call the office at [phone].  I look forward to our next visit and until then take care and stay safe.  Regards,   Valli Gaw, MD   [facility]

## 2023-12-23 NOTE — Progress Notes (Unsigned)
 SUBJECTIVE:   Chief Complaint  Patient presents with   Annual Exam   HPI Presents for annual physical  Discussed the use of AI scribe software for clinical note transcription with the patient, who gave verbal consent to proceed.  History of Present Illness Shannon [name] is a 73 year old female who presents for an annual physical exam.  Her mammogram is ordered, her DEXA scan has been completed, and her colonoscopy is up to date. She recently had her labs done.  Her simvastatin  dosage was increased to 40 mg due to high triglycerides. She experienced a rash, which led to a referral to a rheumatologist. Autoimmune markers showed a positive ANA with low antibody titers. She has an appointment with a rheumatologist in January.  She has a history of recurrent urinary tract infections and has been prescribed medication for potential future occurrences. She saw a urologist and was treated with cefdinir after resistance to Macrobid . She uses a pea-sized amount of estrogen cream on the urethra, although she has not seen improvement yet.  She reports weight gain and attributes it to aging. She is active, working on her feet and walking regularly. She recently signed up for a fitness program to help with weight loss and plans to incorporate resistance training. She is considering using her silver sneakers membership at the [facility] for additional exercise.  No chest pain, shortness of breath, ear infections, nasal congestion, or rhinorrhea. She is 'always a little constipated' but denies hematochezia. 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     PERTINENT PMH / PSH: As above  OBJECTIVE:  BP 110/64   Pulse 67   Temp 98.1 F (36.7 C)   Resp 20   Ht 5' 4.75" (1.645 m)   Wt 147 lb 4 oz (66.8 kg)   SpO2 97%   BMI 24.69 kg/m    Physical Exam Vitals reviewed.  Constitutional:      General: She is not in acute distress.    Appearance:  Normal appearance. She is normal weight. She is not ill-appearing, toxic-appearing or diaphoretic.  HENT:     Head: Normocephalic.     Right Ear: Tympanic membrane, ear canal and external ear normal.     Left Ear: Tympanic membrane, ear canal and external ear normal.     Nose: Nose normal.     Mouth/Throat:     Mouth: Mucous membranes are moist.  Eyes:     General:        Right eye: No discharge.        Left eye: No discharge.     Conjunctiva/sclera: Conjunctivae normal.  Neck:     Thyroid : No thyromegaly.  Cardiovascular:     Rate and Rhythm: Normal rate and regular rhythm.     Heart sounds: Normal heart sounds.  Pulmonary:     Effort: Pulmonary effort is normal.     Breath sounds: Normal breath sounds.  Abdominal:     General: Bowel sounds are normal.  Musculoskeletal:        General: Normal range of motion.  Skin:    General: Skin is warm and dry.  Neurological:     General: No focal deficit present.     Mental Status: She is alert and oriented to person, place, and time. Mental status is at baseline.  Psychiatric:        Mood and Affect: Mood normal.  Behavior: Behavior normal.        Thought Content: Thought content normal.        Judgment: Judgment normal.           12/23/2023    9:39 AM 10/23/2023    8:10 AM 01/29/2023    9:30 AM 11/28/2022    1:49 PM 11/17/2021    3:38 PM  Depression screen PHQ 2/9  Decreased Interest 0 0 0 0 0  Down, Depressed, Hopeless 0 0 0 0 0  PHQ - 2 Score 0 0 0 0 0  Altered sleeping 0 1 1 0   Tired, decreased energy 0 0 0 0   Change in appetite 0 0 0 0   Feeling bad or failure about yourself  0 0 0 0   Trouble concentrating 0 0 0 0   Moving slowly or fidgety/restless 0 0 0 0   Suicidal thoughts 0 0 0 0   PHQ-9 Score 0 1 1 0   Difficult doing work/chores Not difficult at all Not difficult at all Not difficult at all Not difficult at all       12/23/2023    9:39 AM 10/23/2023    8:58 AM 11/28/2022    1:49 PM  GAD 7 : Generalized  Anxiety Score  Nervous, Anxious, on Edge 0 1 0  Control/stop worrying 0 0 0  Worry too much - different things 0 0 0  Trouble relaxing 0 0 0  Restless 0 0 0  Easily annoyed or irritable 0 0 0  Afraid - awful might happen 0 0 0  Total GAD 7 Score 0 1 0  Anxiety Difficulty Not difficult at all Not difficult at all Not difficult at all    ASSESSMENT/PLAN:  Breast cancer screening by mammogram -     3D Screening Mammogram, Left and Right; Future  Annual physical exam Assessment & Plan: Routine health maintenance is current. Mammogram ordered, DEXA scan completed, colonoscopy due in 2032 or 2033. Recent COVID and pneumonia vaccinations administered. Engages in regular physical activity with a goal of weight loss through increased exercise and resistance training. - Recommend routine self breast exams - Annual labs reviewed - Depression screening negative - Encourage regular exercise and resistance training. - Continue routine health maintenance screenings as scheduled.   Recurrent UTI Assessment & Plan: Recurrent UTIs with recent episode post-vacation. Previous Macrobid  ineffective; cefdinir prescribed. Possible vaginal atrophy contributing to symptoms, recommended consistent topical estrogen use. Not currently having symptoms - Continue topical estrogen application as prescribed. - Follow up with urologist in July.   Hyperlipidemia, unspecified hyperlipidemia type Assessment & Plan: Managed with simvastatin , increased to 40 mg due to elevated triglycerides. - Continue simvastatin  40 mg daily.    Erythematous rash Assessment & Plan: Has not had rash recently Recent labs positive ANA, non specific.  Elevated titre Has scheduled appointment with Rheumatology for evaluation.      PDMP reviewed  Return if symptoms worsen or fail to improve, for PCP.  Valli Gaw, MD

## 2023-12-25 ENCOUNTER — Encounter: Payer: Self-pay | Admitting: Family Medicine

## 2023-12-25 DIAGNOSIS — Z0000 Encounter for general adult medical examination without abnormal findings: Secondary | ICD-10-CM | POA: Insufficient documentation

## 2023-12-25 NOTE — Assessment & Plan Note (Signed)
 Managed with simvastatin , increased to 40 mg due to elevated triglycerides. - Continue simvastatin  40 mg daily.

## 2023-12-25 NOTE — Assessment & Plan Note (Signed)
 Has not had rash recently Recent labs positive ANA, non specific.  Elevated titre Has scheduled appointment with Rheumatology for evaluation.

## 2023-12-25 NOTE — Assessment & Plan Note (Signed)
 Routine health maintenance is current. Mammogram ordered, DEXA scan completed, colonoscopy due in 2032 or 2033. Recent COVID and pneumonia vaccinations administered. Engages in regular physical activity with a goal of weight loss through increased exercise and resistance training. - Recommend routine self breast exams - Annual labs reviewed - Depression screening negative - Encourage regular exercise and resistance training. - Continue routine health maintenance screenings as scheduled.

## 2023-12-25 NOTE — Assessment & Plan Note (Signed)
 Recurrent UTIs with recent episode post-vacation. Previous Macrobid  ineffective; cefdinir prescribed. Possible vaginal atrophy contributing to symptoms, recommended consistent topical estrogen use. Not currently having symptoms - Continue topical estrogen application as prescribed. - Follow up with urologist in July.

## 2023-12-30 IMAGING — US US ABDOMEN LIMITED
1 series · 14 of 25 positions shown · non-contrast
Comparison: None Available.

CLINICAL DATA: Abnormal LFTs

EXAM:
ULTRASOUND ABDOMEN LIMITED RIGHT UPPER QUADRANT

[Series 1: us abdomen limited · 0.20mm/px · 14 of 42 slices shown]
[im 1/42]
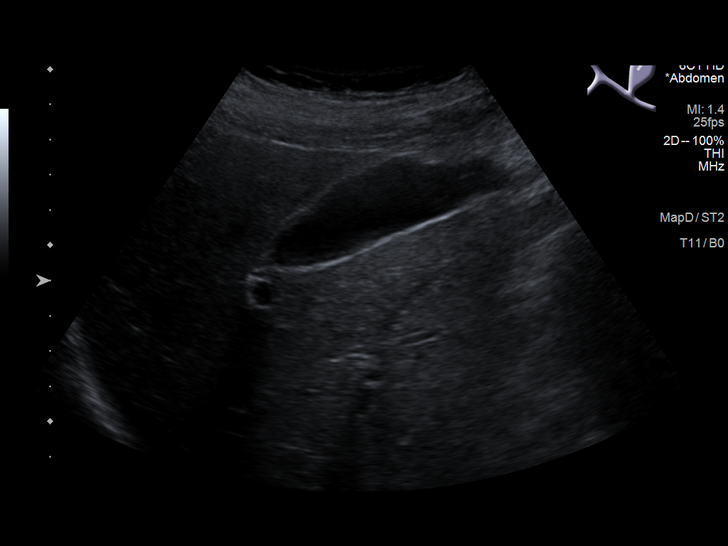
[im 4/42]
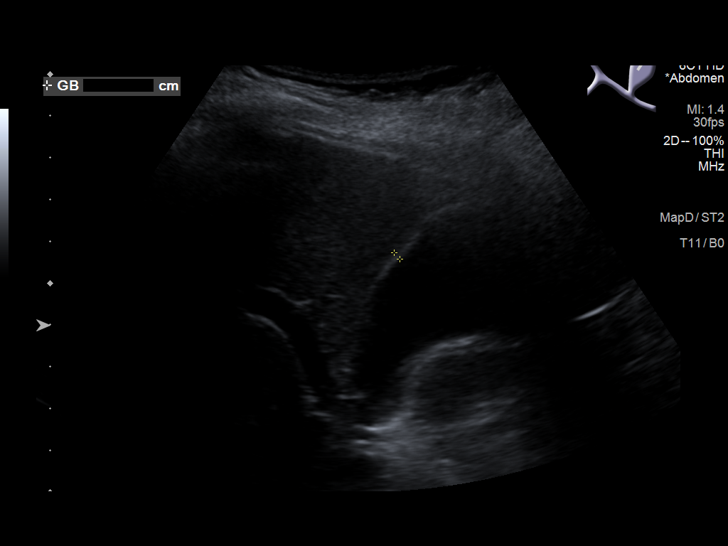
[im 7/42]
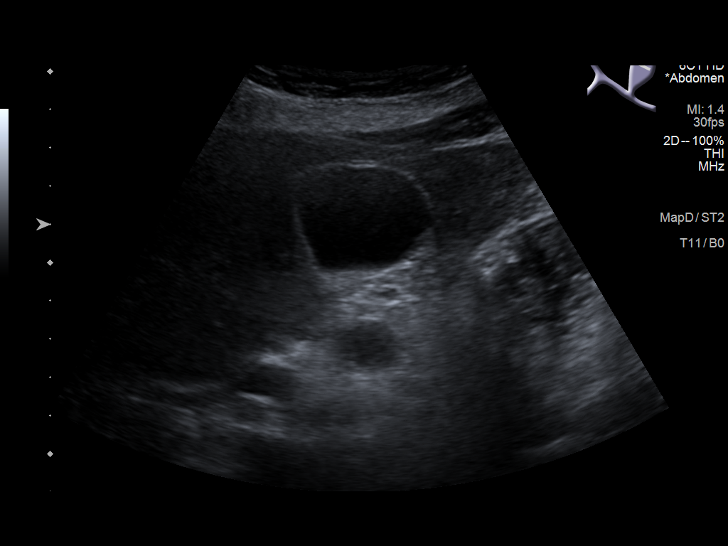
[im 11/42]
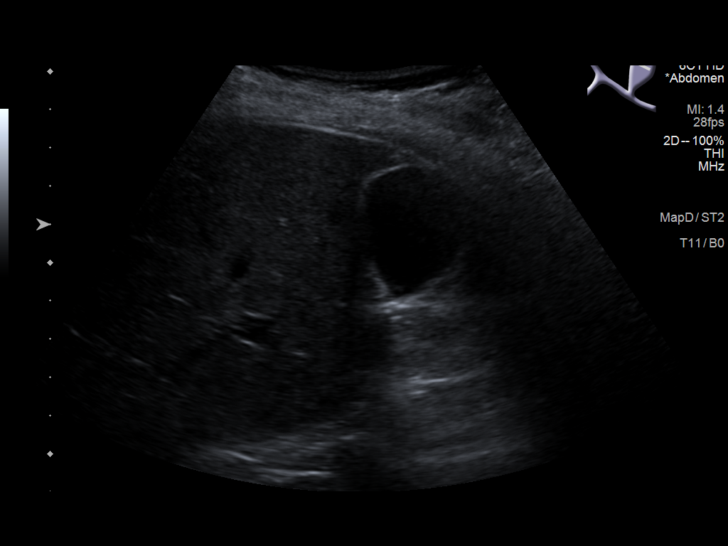
[im 14/42]
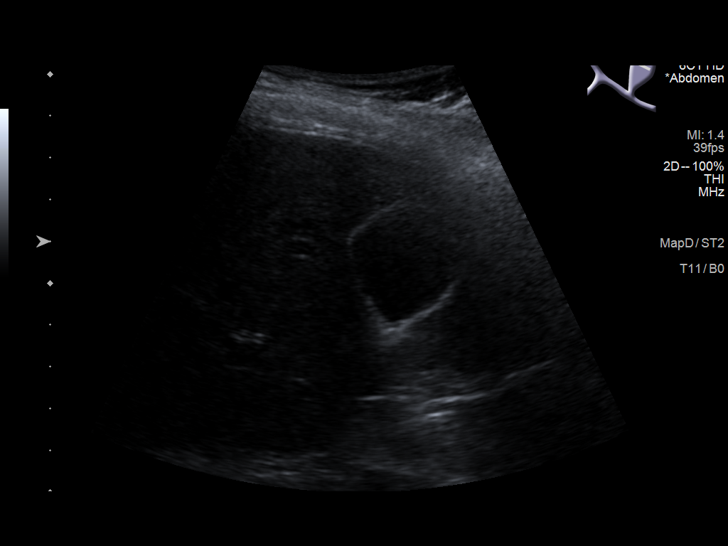
[im 16/42]
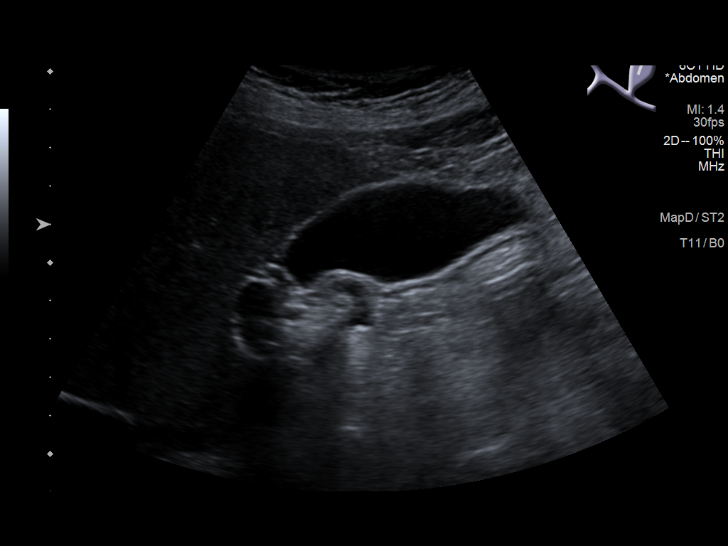
[im 19/42]
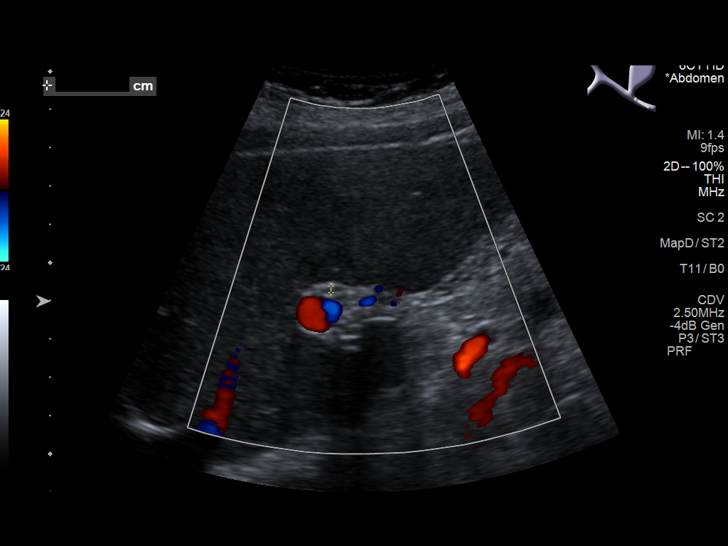
[im 23/42]
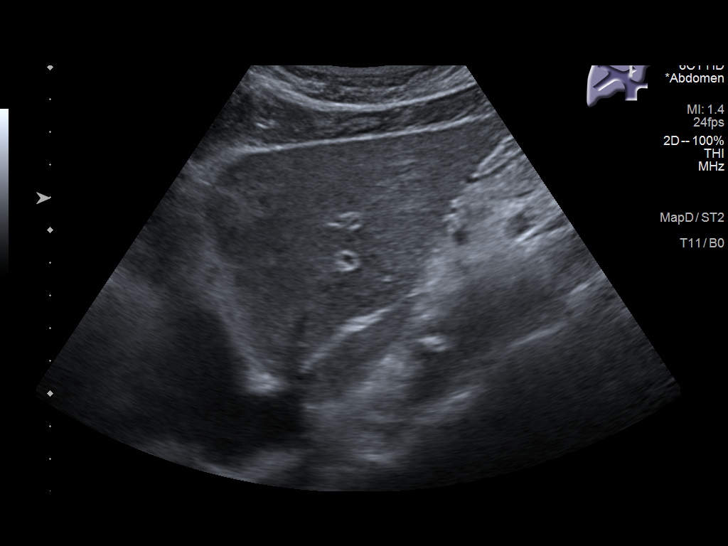
[im 26/42]
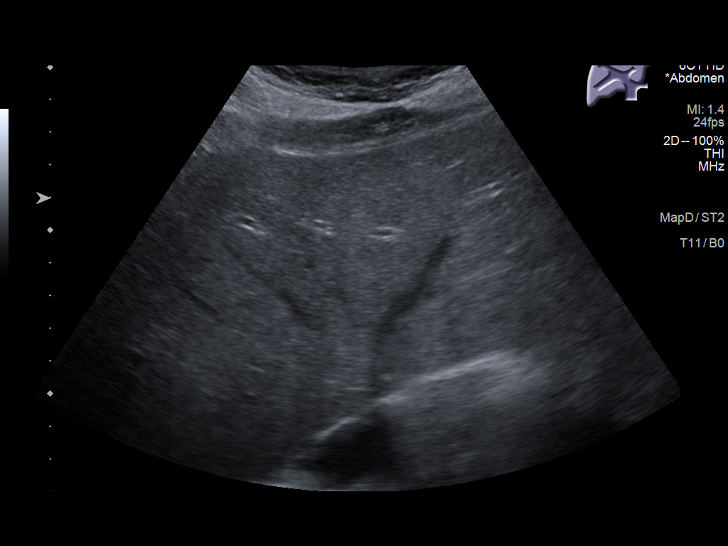
[im 28/42]
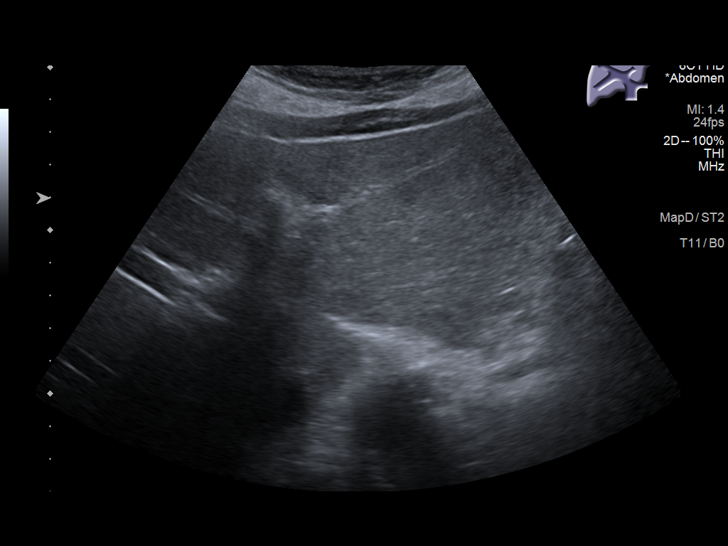
[im 31/42]
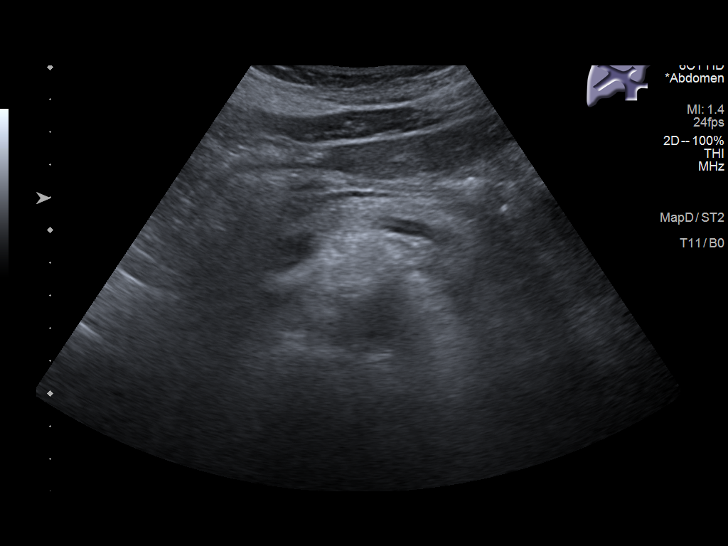
[im 35/42]
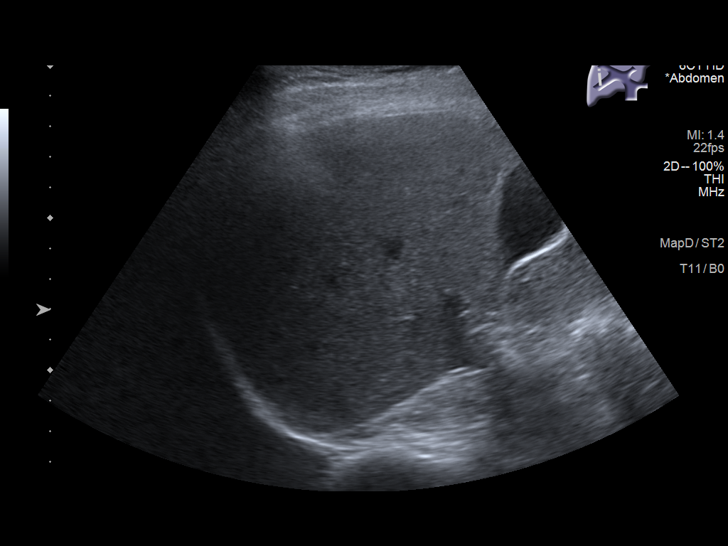
[im 38/42]
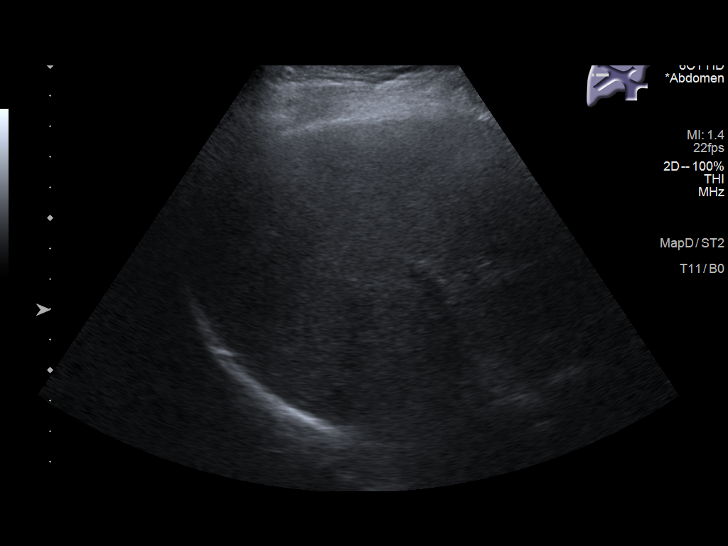
[im 42/42]
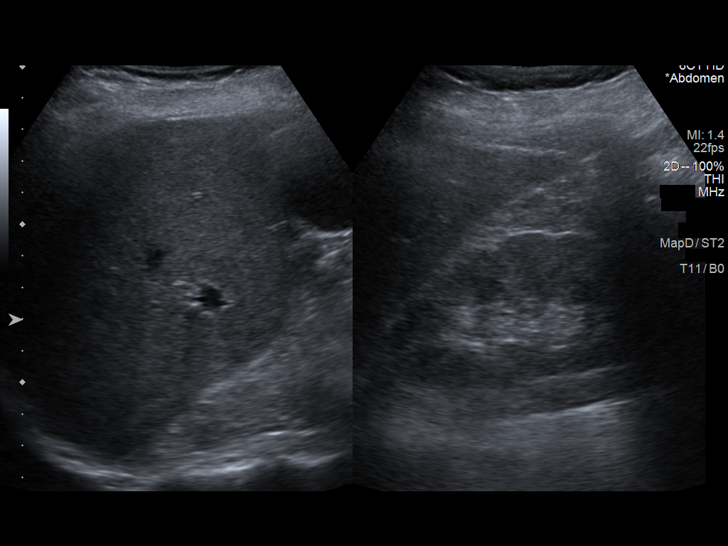

[14 of 25 positions shown; findings below may reference images not displayed]

FINDINGS: Gallbladder:

No gallstones or wall thickening visualized. No sonographic Murphy
sign noted by sonographer.

Common bile duct:

Diameter: 2 mm

Liver:

No focal lesion identified. Within normal limits in parenchymal
echogenicity. Portal vein is patent on color Doppler imaging with
normal direction of blood flow towards the liver.

Other: None.
IMPRESSION: Unremarkable examination.

## 2024-01-29 ENCOUNTER — Ambulatory Visit: Admitting: Urology

## 2024-02-03 ENCOUNTER — Ambulatory Visit (INDEPENDENT_AMBULATORY_CARE_PROVIDER_SITE_OTHER): Payer: PPO | Admitting: *Deleted

## 2024-02-03 VITALS — Ht 65.0 in | Wt 144.0 lb

## 2024-02-03 DIAGNOSIS — Z0000 Encounter for general adult medical examination without abnormal findings: Secondary | ICD-10-CM | POA: Diagnosis not present

## 2024-02-03 NOTE — Patient Instructions (Signed)
 Shannon [name] , Thank you for taking time out of your busy schedule to complete your Annual Wellness Visit with me. I enjoyed our conversation and look forward to speaking with you again next year. I, as well as your care team,  appreciate your ongoing commitment to your health goals. Please review the following plan we discussed and let me know if I can assist you in the future. Your Game plan/ To Do List    Referrals: If you haven't heard from the office you've been referred to, please reach out to them at the phone provided.  Your vaccines have been updated. Remember to get your flu and covid vaccines annually. Follow up Visits: Next Medicare AWV with our clinical staff: 02/08/25 @ 8:50   Have you seen your provider in the last 6 months (3 months if uncontrolled diabetes)? Yes Next Office Visit with your provider: 08/10/24  Clinician Recommendations:  Aim for 30 minutes of exercise or brisk walking, 6-8 glasses of water, and 5 servings of fruits and vegetables each day.       This is a list of the screening recommended for you and due dates:  Health Maintenance  Topic Date Due   Mammogram  02/04/2024   Flu Shot  02/21/2024   Medicare Annual Wellness Visit  02/02/2025   Colon Cancer Screening  12/01/2026   DTaP/Tdap/Td vaccine (5 - Td or Tdap) 11/21/2031   Pneumococcal Vaccine for age over 61  Completed   DEXA scan (bone density measurement)  Completed   COVID-19 Vaccine  Completed   Hepatitis C Screening  Completed   Zoster (Shingles) Vaccine  Completed   Hepatitis B Vaccine  Aged Out   HPV Vaccine  Aged Out   Meningitis B Vaccine  Aged Out    Advanced directives: (In Chart) A copy of your advanced directives are scanned into your chart should your provider ever need it. Advance Care Planning is important because it:  [x]  Makes sure you receive the medical care that is consistent with your values, goals, and preferences  [x]  It provides guidance to your family and loved ones and  reduces their decisional burden about whether or not they are making the right decisions based on your wishes.  Follow the link provided in your after visit summary or read over the paperwork we have mailed to you to help you started getting your Advance Directives in place. If you need assistance in completing these, please reach out to us  so that we can help you!

## 2024-02-03 NOTE — Progress Notes (Signed)
 Subjective:   Shannon [name] is a 73 y.o. who presents for a Medicare Wellness preventive visit.  As a reminder, Annual Wellness Visits don't include a physical exam, and some assessments may be limited, especially if this visit is performed virtually. We may recommend an in-person follow-up visit with your provider if needed.  Visit Complete: Virtual I connected with  Shannon [name] on 02/03/24 by a audio enabled telemedicine application and verified that I am speaking with the correct person using two identifiers.  Patient Location: Home  Provider Location: Home Office  I discussed the limitations of evaluation and management by telemedicine. The patient expressed understanding and agreed to proceed.  Vital Signs: Because this visit was a virtual/telehealth visit, some criteria may be missing or patient reported. Any vitals not documented were not able to be obtained and vitals that have been documented are patient reported.  VideoDeclined- This patient declined Librarian, academic. Therefore the visit was completed with audio only.  Persons Participating in Visit: Patient.  AWV Questionnaire: Yes: Patient Medicare AWV questionnaire was completed by the patient on 01/30/24; I have confirmed that all information answered by patient is correct and no changes since this date.  Cardiac Risk Factors include: advanced age (>23men, >43 women);dyslipidemia     Objective:    Today's Vitals   02/03/24 1008  Weight: 144 lb (65.3 kg)  Height: 5' 5 (1.651 m)   Body mass index is 23.96 kg/m.     02/03/2024   10:19 AM 10/03/2023    2:28 PM 01/29/2023    9:26 AM 04/17/2022    8:49 AM 11/17/2021    3:39 PM 02/15/2020    1:43 PM  Advanced Directives  Does Patient Have a Medical Advance Directive? Yes No Yes Yes Yes No  Type of Estate agent of [place];Living will  Healthcare Power of [place];Living will Healthcare Power of [place];Living  will Healthcare Power of [place];Living will   Does patient want to make changes to medical advance directive? No - Patient declined    No - Patient declined   Copy of Healthcare Power of Attorney in Chart? Yes - validated most recent copy scanned in chart (See row information)    No - copy requested   Would patient like information on creating a medical advance directive?      No - Patient declined    Current Medications (verified) Outpatient Encounter Medications as of 02/03/2024  Medication Sig   b complex vitamins capsule Take by mouth.   Calcium Carb-Cholecalciferol 600-10 MG-MCG TABS    Cholecalciferol (D3 HIGH POTENCY) 125 MCG (5000 UT) capsule    Cranberry-Vitamin C-Vitamin E (CRANBERRY PLUS VITAMIN C) 4200-20-3 MG-MG-UNIT CAPS    estradiol  (ESTRACE ) 0.1 MG/GM vaginal cream 1 gram intravaginally at bedtime daily for 2-3 weeks, then decrease to 3 times per week   levothyroxine  (SYNTHROID ) 88 MCG tablet Take 1 tablet (88 mcg total) by mouth daily.   Multiple Vitamins-Minerals (CENTRUM SILVER PO) Take 1 tablet by mouth daily.   Probiotic Product (PROBIOTIC-10 ULTIMATE PO) Take by mouth.   simvastatin  (ZOCOR ) 40 MG tablet Take 1 tablet (40 mg total) by mouth at bedtime.   cefUROXime  (CEFTIN ) 500 MG tablet Take 1 tablet (500 mg total) by mouth 2 (two) times daily with a meal. (Patient not taking: Reported on 02/03/2024)   No facility-administered encounter medications on file as of 02/03/2024.    Allergies (verified) Patient has no known allergies.   History: Past Medical  History:  Diagnosis Date   GERD (gastroesophageal reflux disease)    Hyperlipidemia    Osteoporosis    now has osteopenia   Thyroid  disease    Past Surgical History:  Procedure Laterality Date   HAND SURGERY     40 yrs ago   Family History  Problem Relation Age of Onset   Breast cancer Mother 59   Cancer Mother    Varicose Veins Mother    Heart disease Father    Cancer Father        Prostate    Diabetes Father        diagnosed later in life   Prostate cancer Father    Colon polyps Father 70   Parkinson's disease Father    Congestive Heart Failure Father    Asthma Brother    ADD / ADHD Son    Colon cancer Neg Hx    Rectal cancer Neg Hx    Stomach cancer Neg Hx    Esophageal cancer Neg Hx    Social History   Socioeconomic History   Marital status: Married    Spouse name: Not on file   Number of children: Not on file   Years of education: Not on file   Highest education level: Bachelor's degree (e.g., BA, AB, BS)  Occupational History   Not on file  Tobacco Use   Smoking status: Former    Current packs/day: 0.00    Types: Cigarettes    Quit date: 01/18/1989    Years since quitting: 35.0   Smokeless tobacco: Never  Vaping Use   Vaping status: Never Used  Substance and Sexual Activity   Alcohol use: Yes    Alcohol/week: 10.0 standard drinks of alcohol    Types: 10 Glasses of wine per week   Drug use: No   Sexual activity: Yes    Partners: Male  Other Topics Concern   Not on file  Social History Narrative   Lives in [place] with husband. 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 Dog in home. Born in [place], raised in [place]. Previously lived in [place].      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[place]   Social Drivers of [employer]: Low Risk  (01/30/2024)   Overall Financial Resource Strain (CARDIA)    Difficulty of Paying Living Expenses: Not hard at all  Food Insecurity: No Food Insecurity (01/30/2024)   Hunger Vital Sign    Worried About Running Out of Food in the Last Year: Never true    Ran Out of Food in the Last Year: Never true  Transportation Needs: No Transportation Needs (01/30/2024)   PRAPARE - Administrator, Civil Service (Medical): No    Lack of Transportation (Non-Medical): No  Physical  Activity: Insufficiently Active (01/30/2024)   Exercise Vital Sign    Days of Exercise per Week: 2 days    Minutes of Exercise per Session: 30 min  Stress: No Stress Concern Present (01/30/2024)   [employer] of Occupational Health - Occupational Stress Questionnaire    Feeling of Stress: Only a little  Social Connections: Socially Integrated (01/30/2024)   Social Connection and Isolation Panel    Frequency of Communication with Friends and Family: Three times a week  Frequency of Social Gatherings with Friends and Family: Twice a week    Attends Religious Services: 1 to 4 times per year    Active Member of [employer] or Organizations: Yes    Attends Engineer, structural: More than 4 times per year    Marital Status: Married    Tobacco Counseling Counseling given: Not Answered    Clinical Intake:  Pre-visit preparation completed: Yes  Pain : No/denies pain     BMI - recorded: 23.96 Nutritional Status: BMI of 19-24  Normal Nutritional Risks: None Diabetes: No  Lab Results  Component Value Date   HGBA1C 5.6 10/25/2023     How often do you need to have someone help you when you read instructions, pamphlets, or other written materials from your doctor or pharmacy?: 1 - Never  Interpreter Needed?: No  Information entered by :: R. Jarmarcus Wambold LPN   Activities of Daily Living     01/30/2024    8:54 AM  In your present state of health, do you have any difficulty performing the following activities:  Hearing? 0  Vision? 0  Comment glasses  Difficulty concentrating or making decisions? 0  Walking or climbing stairs? 0  Dressing or bathing? 0  Doing errands, shopping? 0  Preparing Food and eating ? N  Using the Toilet? N  In the past six months, have you accidently leaked urine? Y  Do you have problems with loss of bowel control? N  Managing your Medications? N  Managing your Finances? N  Housekeeping or managing your Housekeeping? N    No care team member to  display  I have updated your Care Teams any recent Medical Services you may have received from other providers in the past year.     Assessment:   This is a routine wellness examination for Shannon [name].  Hearing/Vision screen Hearing Screening - Comments:: No issues Vision Screening - Comments:: glasses   Goals Addressed             This Visit's Progress    Patient Stated       Wants to get more physical activity         Depression Screen     02/03/2024   10:15 AM 12/23/2023    9:39 AM 10/23/2023    8:10 AM 01/29/2023    9:30 AM 11/28/2022    1:49 PM 11/17/2021    3:38 PM 11/10/2021    9:05 AM  PHQ 2/9 Scores  PHQ - 2 Score 0 0 0 0 0 0 0  PHQ- 9 Score 0 0 1 1 0      Fall Risk     01/30/2024    8:54 AM 12/23/2023    9:39 AM 01/25/2023    8:48 AM 11/28/2022    1:49 PM 11/17/2021    3:43 PM  Fall Risk   Falls in the past year? 0 0 0 0 0  Number falls in past yr: 0 0 0 0 0  Injury with Fall? 0 0 0 0   Risk for fall due to : No Fall Risks No Fall Risks No Fall Risks No Fall Risks   Follow up Falls evaluation completed;Falls prevention discussed Falls evaluation completed;Education provided Falls prevention discussed Falls evaluation completed Falls evaluation completed      Data saved with a previous flowsheet row definition    MEDICARE RISK AT HOME:  Medicare Risk at Home Any stairs in or around the home?: (Patient-Rptd) Yes If so, are there  any without handrails?: (Patient-Rptd) No Home free of loose throw rugs in walkways, pet beds, electrical cords, etc?: (Patient-Rptd) Yes Adequate lighting in your home to reduce risk of falls?: (Patient-Rptd) Yes Life alert?: (Patient-Rptd) No Use of a cane, walker or w/c?: (Patient-Rptd) No Grab bars in the bathroom?: (Patient-Rptd) No Shower chair or bench in shower?: (Patient-Rptd) No Elevated toilet seat or a handicapped toilet?: (Patient-Rptd) No  TIMED UP AND GO:  Was the test performed?  No  Cognitive Function: 6CIT  completed    02/15/2020    1:54 PM  MMSE - Mini Mental State Exam  Not completed: Unable to complete        02/03/2024   10:21 AM 01/29/2023    9:28 AM  6CIT Screen  What Year? 0 points 0 points  What month? 0 points 0 points  What time? 0 points 0 points  Count back from 20 0 points 0 points  Months in reverse 0 points 0 points  Repeat phrase 0 points 2 points  Total Score 0 points 2 points    Immunizations Immunization History  Administered Date(s) Administered   Fluad Quad(high Dose 65+) 04/09/2022   Influenza Split 04/09/2022   Influenza, High Dose Seasonal PF 05/03/2018, 04/09/2023   Influenza,inj,Quad PF,6+ Mos 05/20/2013, 06/02/2014   Influenza-Unspecified 05/20/2016, 06/28/2017, 04/17/2019, 05/08/2020, 05/09/2021, 04/03/2023   Moderna Covid Bivalent Peds Booster(46mo Thru 41yrs) 05/13/2022   Moderna Covid-19 Fall Seasonal Vaccine 2yrs & older 05/13/2022, 04/09/2023   PFIZER(Purple Top)SARS-COV-2 Vaccination 08/12/2019, 09/02/2019, 03/18/2020, 05/09/2021, 11/29/2022   Pfizer Covid-19 Vaccine Bivalent Booster 6yrs & up 11/20/2021   Pneumococcal Conjugate-13 09/10/2017   Pneumococcal Polysaccharide-23 10/13/2018   Rsv, Bivalent, Protein Subunit Rsvpref,pf [place]) 05/23/2022   Td 11/20/2021   Tdap 05/11/2005, 05/21/2011, 11/20/2021   Zoster Recombinant(Shingrix ) 09/10/2017, 06/06/2018   Zoster, Live 05/20/2012, 08/13/2012    Screening Tests Health Maintenance  Topic Date Due   COVID-19 Vaccine (9 - 2024-25 season) 10/07/2023   Medicare Annual Wellness (AWV)  01/29/2024   MAMMOGRAM  02/04/2024   INFLUENZA VACCINE  02/21/2024   Colonoscopy  12/01/2026   DTaP/Tdap/Td (5 - Td or Tdap) 11/21/2031   Pneumococcal Vaccine: 50+ Years  Completed   DEXA SCAN  Completed   Hepatitis C Screening  Completed   Zoster Vaccines- Shingrix   Completed   Hepatitis B Vaccines  Aged Out   HPV VACCINES  Aged Out   Meningococcal B Vaccine  Aged Out    Health Maintenance  Health  Maintenance Due  Topic Date Due   COVID-19 Vaccine (9 - 2024-25 season) 10/07/2023   Medicare Annual Wellness (AWV)  01/29/2024   Health Maintenance Items Addressed: Discussed the need to get a flu and covid vaccine annually  Additional Screening:  Vision Screening: Recommended annual ophthalmology exams for early detection of glaucoma and other disorders of the eye. Up to date  Dr. Carolee Would you like a referral to an eye doctor? No    Dental Screening: Recommended annual dental exams for proper oral hygiene  Community Resource Referral / Chronic Care Management: CRR required this visit?  No   CCM required this visit?  No   Plan:    I have personally reviewed and noted the following in the patient's chart:   Medical and social history Use of alcohol, tobacco or illicit drugs  Current medications and supplements including opioid prescriptions. Patient is not currently taking opioid prescriptions. Functional ability and status Nutritional status Physical activity Advanced directives List of other physicians Hospitalizations, surgeries, and  ER visits in previous 12 months Vitals Screenings to include cognitive, depression, and falls Referrals and appointments  In addition, I have reviewed and discussed with patient certain preventive protocols, quality metrics, and best practice recommendations. A written personalized care plan for preventive services as well as general preventive health recommendations were provided to patient.   Shannon Fredericks, LPN   2/85/7974   After Visit Summary: (MyChart) Due to this being a telephonic visit, the after visit summary with patients personalized plan was offered to patient via MyChart   Notes: Nothing significant to report at this time.

## 2024-02-10 ENCOUNTER — Ambulatory Visit
Admission: RE | Admit: 2024-02-10 | Discharge: 2024-02-10 | Disposition: A | Discharge status: Home / Self Care | Source: Ambulatory Visit | Attending: Family Medicine | Admitting: Family Medicine

## 2024-02-10 DIAGNOSIS — Z1231 Encounter for screening mammogram for malignant neoplasm of breast: Secondary | ICD-10-CM | POA: Diagnosis present

## 2024-03-16 NOTE — Progress Notes (Unsigned)
 03/17/2024 7:00 PM   ANALYAH [name] 1951/07/12 [id number]  Referring provider: Abbey Bruckner, MD [address] [place],  [place] [address]  Urological history: 1. SUI - seen PT in the past  2. rUTI's - E. Coli Nov 21, 2023 - Less than 10,000 colonies October 23, 2023 - E. Coli October 03, 2023 - Vaginal estrogen cream, cranberry tablets and probiotics  3.  High risk hematuria - former smoker - CTU (10/2022) - benign cysts - cysto (11/2022) -urethral caruncle  No chief complaint on file.  HPI: Shannon [name] is a 73 y.o. woman who presents today for possible UTI.  Previous records reviewed.   I last saw her in May for E. coli UTI.   I also gave her a prescription for antibiotics to take with her when she went to [place], she states that she had to use that prescription while she was in [place].   She woke up yesterday with symptoms of UTI.    UA ***   PMH: Past Medical History:  Diagnosis Date   GERD (gastroesophageal reflux disease)    Hyperlipidemia    Osteoporosis    now has osteopenia   Thyroid  disease     Surgical History: Past Surgical History:  Procedure Laterality Date   HAND SURGERY     40 yrs ago    Home Medications:  Allergies as of 03/17/2024   No Known Allergies      Medication List        Accurate as of March 16, 2024  7:00 PM. If you have any questions, ask your nurse or doctor.          b complex vitamins capsule Take by mouth.   Calcium Carb-Cholecalciferol 600-10 MG-MCG Tabs   cefUROXime  500 MG tablet Commonly known as: CEFTIN  Take 1 tablet (500 mg total) by mouth 2 (two) times daily with a meal.   CENTRUM SILVER PO Take 1 tablet by mouth daily.   Cranberry Plus Vitamin C 4200-20-3 MG-MG-UNIT Caps Generic drug: Cranberry-Vitamin C-Vitamin E   D3 High Potency 125 MCG (5000 UT) capsule Generic drug: Cholecalciferol   estradiol  0.1 MG/GM vaginal cream Commonly known as: ESTRACE  1 gram intravaginally at bedtime daily  for 2-3 weeks, then decrease to 3 times per week   levothyroxine  88 MCG tablet Commonly known as: SYNTHROID  Take 1 tablet (88 mcg total) by mouth daily.   PROBIOTIC-10 ULTIMATE PO Take by mouth.   simvastatin  40 MG tablet Commonly known as: ZOCOR  Take 1 tablet (40 mg total) by mouth at bedtime.        Allergies: No Known Allergies  Family History: Family History  Problem Relation Age of Onset   Breast cancer Mother 3   Cancer Mother    Varicose Veins Mother    Heart disease Father    Cancer Father        Prostate   Diabetes Father        diagnosed later in life   Prostate cancer Father    Colon polyps Father 16   Parkinson's disease Father    Congestive Heart Failure Father    Asthma Brother    ADD / ADHD Son    Colon cancer Neg Hx    Rectal cancer Neg Hx    Stomach cancer Neg Hx    Esophageal cancer Neg Hx     Social History:  reports that she quit smoking about 35 years ago. Her smoking use included cigarettes. She has never used  smokeless tobacco. She reports current alcohol use of about 10.0 standard drinks of alcohol per week. She reports that she does not use drugs.  ROS: Pertinent ROS in HPI  Physical Exam: There were no vitals taken for this visit.  Constitutional:  Well nourished. Alert and oriented, No acute distress. HEENT: [place] AT, moist mucus membranes.  Trachea midline, no masses. Cardiovascular: No clubbing, cyanosis, or edema. Respiratory: Normal respiratory effort, no increased work of breathing. GU: No CVA tenderness.  No bladder fullness or masses.  Recession of labia minora, dry, pale vulvar vaginal mucosa and loss of mucosal ridges and folds.  Normal urethral meatus, no lesions, no prolapse, no discharge.   No urethral masses, tenderness and/or tenderness. No bladder fullness, tenderness or masses. *** vagina mucosa, *** estrogen effect, no discharge, no lesions, *** pelvic support, *** cystocele and *** rectocele noted.  No cervical motion  tenderness.  Uterus is freely mobile and non-fixed.  No adnexal/parametria masses or tenderness noted.  Anus and perineum are without rashes or lesions.   ***  Neurologic: Grossly intact, no focal deficits, moving all 4 extremities. Psychiatric: Normal mood and affect.    Laboratory Data: See EPIC and HPI  I have reviewed the labs.   Pertinent Imaging: N/A  Assessment & Plan:    1. Recurrent UTI's - Urinalysis, Complete - grossly infected - Urine culture pending - ***  2. Vaginal atrophy - continue vaginal estrogen cream three nights weekly  No follow-ups on file.  These notes generated with voice recognition software. I apologize for typographical errors.  Shannon [name]  [facility] [address]  [address] [place], [place] [address] [phone]

## 2024-03-17 ENCOUNTER — Encounter: Payer: Self-pay | Admitting: Urology

## 2024-03-17 ENCOUNTER — Ambulatory Visit (INDEPENDENT_AMBULATORY_CARE_PROVIDER_SITE_OTHER): Admitting: Urology

## 2024-03-17 VITALS — BP 128/75 | HR 78 | Ht 65.0 in | Wt 145.0 lb

## 2024-03-17 DIAGNOSIS — N952 Postmenopausal atrophic vaginitis: Secondary | ICD-10-CM | POA: Diagnosis not present

## 2024-03-17 DIAGNOSIS — N390 Urinary tract infection, site not specified: Secondary | ICD-10-CM

## 2024-03-17 LAB — MICROSCOPIC EXAMINATION: WBC, UA: >30 /HPF — AB (ref 0–5)

## 2024-03-17 LAB — URINALYSIS, COMPLETE
Bilirubin, UA: NEGATIVE
Glucose, UA: NEGATIVE
Nitrite, UA: POSITIVE — AB
Specific Gravity, UA: 1.02 (ref 1.005–1.030)
Urobilinogen, Ur: 1 mg/dL (ref 0.2–1.0)
pH, UA: 6 (ref 5.0–7.5)

## 2024-03-17 MED ORDER — CIPROFLOXACIN HCL 250 MG PO TABS: 250.0000 mg | ORAL_TABLET | Freq: Two times a day (BID) | ORAL | 0 refills | Status: DC

## 2024-03-21 LAB — CULTURE, URINE COMPREHENSIVE

## 2024-03-23 ENCOUNTER — Ambulatory Visit: Payer: Self-pay | Admitting: Urology

## 2024-03-23 MED ORDER — CIPROFLOXACIN HCL 250 MG PO TABS: 250.0000 mg | ORAL_TABLET | Freq: Two times a day (BID) | ORAL | 0 refills | Status: DC

## 2024-03-23 MED ORDER — NITROFURANTOIN MONOHYD MACRO 100 MG PO CAPS: ORAL_CAPSULE | ORAL | 0 refills | Status: AC

## 2024-03-31 ENCOUNTER — Ambulatory Visit: Admitting: Urology

## 2024-04-01 ENCOUNTER — Encounter: Admitting: Internal Medicine

## 2024-05-05 ENCOUNTER — Other Ambulatory Visit

## 2024-05-05 ENCOUNTER — Ambulatory Visit: Payer: PPO | Admitting: Internal Medicine

## 2024-05-05 ENCOUNTER — Encounter: Payer: Self-pay | Admitting: Internal Medicine

## 2024-05-05 VITALS — BP 112/60 | HR 71 | Ht 65.0 in | Wt 148.6 lb

## 2024-05-05 DIAGNOSIS — E039 Hypothyroidism, unspecified: Secondary | ICD-10-CM

## 2024-05-05 DIAGNOSIS — E042 Nontoxic multinodular goiter: Secondary | ICD-10-CM | POA: Insufficient documentation

## 2024-05-05 NOTE — Progress Notes (Signed)
 Patient ID: Shannon [name], female   DOB: 1951/06/23, 73 y.o.   MRN: [id number]  HPI f/u for  Shannon [name] is a 73 y.o.-year-old female, returning for f/u for MNG and hypothyroidism. Last visit 1 year ago.  Interim hx: Pt denies feeling nodules in neck, hoarseness, dysphagia, choking. Before last visit she gained 5 pounds due to stopping exercise and relaxing diet.  She lost 3 lbs since then. She was recently traveling in [place]: [place], [place], [place]. She was very active there but mentions that she ate more while there.  Multinodular goiter Reviewed and addended history: Pt's PCP felt a right sided nodule at OV in 2014  - Thyroid  U/S (05/22/2013): heterogeneous gland, multinodular goiter - largest nodule in left lobe: 2.5 x 1.4 x 2.4 cm, solid, slightly echogenic, moderate internal vascularity, no calcifications.  - largest nodule on the right: 1.4 x 0.9 x 1.0 cm, predominantly  isoechoic, with mild internal vascularity, no calcifications.   - FNA both nodules (06/12/2013): Adequacy Reason Satisfactory For Evaluation. Diagnosis THYROID , FINE NEEDLE ASPIRATION LEFT, (SPECIMEN 1 OF 2, COLLECTED ON 06/11/2013). 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  Specimen Clinical Information Nontoxic uninodular goiter, nodule, 2.5 x 1.4 x 2.4cm dominant left nodule   Adequacy Reason Satisfactory For Evaluation. Diagnosis THYROID , FINE NEEDLE ASPIRATION RIGHT, (SPECIMEN 2 OF 2, COLLECTED ON 11/20 2014). BENIGN. FINDINGS CONSISTENT WITH GOITER. FINDINGS CONSISTENT WITH THE CONTENTS OF A CYST.  Specimen Clinical Information Nontoxic uninodular goiter, Nodule, 1.4 x 0.9 x 1.0cm domnant right lobe thyroid  nodule  The first nodule was characterized as consistent with a follicular neoplasm and/or lesion. This was not an entity described in the Bethesda criteria. I was not sure how to interpret this. The treatment plan for a follicular lesion would be to repeat FNA, while  for follicular neoplasm would be surgical lobectomy.  I discussed with the pathologist >> he suggested that we interpret this as FLUS.  Thyroid  U/S (02/21/2015): Right thyroid  lobe Measurements: 5.0 cm x 1.5 cm x 1.8 cm. Heterogeneous thyroid  with relatively increased flow.  - Superior nodule measures 7 mm x 6 mm x 8 mm. - Previously biopsied lower right thyroid  nodule measures 1.2 cm x 1.0 cm x 1.3 cm. Nodule is solid with internal reflectors. Left thyroid  lobe Measurements: 6.3 cm x 2.0 cm x 1.9 cm.  Heterogeneous left thyroid  with relatively increased flow. - Nodule at the inferior aspect has been previously biopsied and measures 2.9 cm x 1.8 cm x 2.8 cm. Isthmus Thickness: 3 mm.  No nodules visualized. Lymphadenopathy: None visualized.  Left thyroid  nodule has increased in size. Due to previous history of follicular lesion of undetermined significance, I suggested repeat biopsy with [facility] molecular testing.     03/03/2015: FNA: Adequacy Reason Satisfactory For Evaluation. 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 COMMENT: THE SPECIMEN IS HYPERCELLULAR AND CONSISTS OF SMALL TO LARGE GROUPS OF FOLLICULAR EPITHELIAL CELLS WITH FOCAL HURTHLE CELL CHANGES. THERE IS MILD CYTOLOGIC ATYPIA, INCLUDING INTRANUCLEAR GROOVES. BASED ON THESE FEATURES, A FOLLICULAR LESION/NEOPLASM CAN NOT BE ENTIRELY RULED OUT. Specimen Clinical Information Nodule at the inferior aspect has been previously biopsied and measures 2.9 cm x 1.8 cm x 2.8 cm Source Thyroid , Fine Needle Aspiration, Left Lobe, (Specimen 1 of 1, collected on 03/02/15 )  AFIRMA test results were benign.  Thyroid  U/S (10/15/2019): CLINICAL DATA:  Prior ultrasound  follow-up. History of multinodular thyroid  goiter and status post prior fine-needle aspiration of both right and  left inferior nodules. The left inferior nodule has been sample twice including most recently on 03/02/2015. Cytology has demonstrated atypia undetermined significance (Bethesda 3).   THYROID  ULTRASOUND Parenchymal Echotexture: Moderately heterogenous Isthmus: 0.4 cm Right lobe: 4.7 x 1.4 x 1.4 cm Left lobe: 5.8 x 1.4 x 1.7 cm _________________________________________________________   Nodule # 1: Prior biopsy: No Location: Right; Superior Maximum size: 1.0 cm; Other 2 dimensions: 0.9 x 0.6 cm, previously, 0.8 cm Composition: solid/almost completely solid (2) Echogenicity: isoechoic (1) Change in features: Yes Given size (<1.4 cm) and appearance, this nodule does NOT meet TI-RADS criteria for biopsy or dedicated follow-up. This nodule is barely visualized currently and may or may not still be present. On the prior study, a clearly appeared spongiform and benign in appearance.  ________________________________________________________   Nodule # 2: Prior biopsy: Yes Location: Right; Inferior Maximum size: 1.3 cm; Other 2 dimensions: 1.1 x 0.8 cm, previously, 1.3 cm Composition: solid/almost completely solid (2) Echogenicity: isoechoic (1) Given size (<1.4 cm) and appearance, this nodule does NOT meet TI-RADS criteria for biopsy or dedicated follow-up. This nodule appears stable. _________________________________________________________   Nodule # 3: Prior biopsy: Yes Location: Left; Inferior Maximum size: 2.4 cm; Other 2 dimensions: 2.2 x 1.5 cm, previously, 2.9 cm Composition: solid/almost completely solid (2) Echogenicity: isoechoic (1)  This nodule shows similar morphology and does appear to be smaller in overall volume compared to the 2016 study. It clearly has not grown.  ________________________________________________________   No abnormal lymph nodes identified.   IMPRESSION: 1. Residual nodule versus pseudo nodule in the superior right lobe measures 1 cm and  does not meet criteria for biopsy or dedicated follow-up. 2. Stable 1.3 cm right inferior solid nodule which has been previously sampled. 3. The left inferior thyroid  nodule which has been previously sampled does appear to be smaller in volume and with similar morphology compared to the prior study in 2016. This nodule currently measures 2.4 x 1.5 x 2.2 cm compared to 2.9 x 1.8 x 2.8 cm previously.   Thyroid  U/S (05/09/2023): Parenchymal Echotexture: Moderately heterogenous  Isthmus: 0.3 cm  Right lobe: 4.5 x 1.3 x 1.4 cm  Left lobe: 5.5 x 1.8 x 1.4 cm  _________________________________________________________   Estimated total number of nodules >/= 1 cm: 2 _________________________________________________________   Nodule labeled 1 is a previously described solid isoechoic/hyperechoic TR 3 nodule versus pseudo nodule in the right superior thyroid  lobe measuring up to 0.6 cm on today's exam, previously 1.0 cm in 2021. Additionally, it has remained similar to the 2016 examination, and stability over a period of greater than 5 years suggests benign etiology. This nodule does NOT meet TI-RADS criteria for biopsy or dedicated follow-up.   Nodule labeled 2 is a previously biopsied solid nodule in the inferior right thyroid  lobe measuring up to 1.3 cm, previously 1.3 cm. It remains similar in size and morphology.   Nodule labeled 3 is a previously biopsied solid nodule in the inferior left thyroid  lobe measuring up to 2.6 cm on today's exam, previously 2.4 cm. The nodule overall remains similar in size and morphology.   IMPRESSION: 1. Multinodular thyroid  gland.  No new or enlarging thyroid  nodules. 2. Similar appearance of previously biopsied nodules in the inferior right and left thyroid  lobes. Correlate with biopsy results.  Hypothyroidism  - dx ~2003 >> started on levoxyl  then >> but stopped subsequently as she did not  feel different. She restarted LT4, on 25 mcg daily and  we then increased the dose gradually.  She was initially not taking this correctly, but she now takes it as advised.  Pt is now on levothyroxine  88 mcg daily, taken: - in am - fasting - black coffee - at least 30-40 min from b'fast - no Fe, no PPIs - + Calcium - most days - Lunchtime - + multivitamins with Lunch - off Biotin (120 MCG)  Reviewed her TFTs: Lab Results  Component Value Date   TSH 3.32 05/06/2023   TSH 2.25 11/26/2022   TSH 2.61 05/07/2022   TSH 3.07 11/08/2021   TSH 2.25 11/03/2020   TSH 3.51 10/02/2019   TSH 2.48 09/22/2018   TSH 3.89 01/03/2018   TSH 6.51 (H) 10/09/2017   TSH 3.22 09/06/2017   FREET4 0.90 05/06/2023   FREET4 0.92 11/26/2022   FREET4 0.89 05/07/2022   FREET4 1.02 11/08/2021   FREET4 0.92 11/03/2020   FREET4 1.15 10/02/2019   FREET4 0.94 09/22/2018   FREET4 1.01 01/03/2018   FREET4 0.84 10/09/2017   FREET4 1.33 09/06/2017    She has a history of osteoporosis-on alendronate  (inconsistently), HL - on Zocor , hair loss (on Rogaine).  ROS: + see HPI  I reviewed pt's medications, allergies, PMH, social hx, family hx, and changes were documented in the history of present illness. Otherwise, unchanged from my initial visit note.  Past Medical History:  Diagnosis Date   GERD (gastroesophageal reflux disease)    Hyperlipidemia    Osteoporosis    now has osteopenia   Thyroid  disease    Past Surgical History:  Procedure Laterality Date   HAND SURGERY     40 yrs ago   History   Social History   Marital Status: Married    Spouse Name: N/A    Number of Children: 1   Occupational History   retail.   Social History Main Topics   Smoking status: Former Smoker    Types: Cigarettes    Quit date: 01/18/1989   Smokeless tobacco: Never Used   Alcohol Use: 4.2 oz/week    7 Glasses of wine per week   Drug Use: No   Sexual Activity: Yes    Partners: Male   Social History Narrative   Lives in [place] with husband. 23YO son, lives in  [place], Consulting civil engineer. Dog in home. Born in [place], raised in [place]. College [place]. Previously lived in [place].      Diet - regular diet      Exercise - walking   Caffeine: coffee in the morning; cup of hot tea      Hobbies - yardwork      Work - [employer] in [place]   Current Outpatient Medications on File Prior to Visit  Medication Sig Dispense Refill   b complex vitamins capsule Take by mouth.     Calcium Carb-Cholecalciferol 600-10 MG-MCG TABS      Cholecalciferol (D3 HIGH POTENCY) 125 MCG (5000 UT) capsule      ciprofloxacin  (CIPRO ) 250 MG tablet Take 1 tablet (250 mg total) by mouth 2 (two) times daily. 14 tablet 0   ciprofloxacin  (CIPRO ) 250 MG tablet Take 1 tablet (250 mg total) by mouth 2 (two) times daily. 14 tablet 0   Cranberry-Vitamin C-Vitamin E (CRANBERRY PLUS VITAMIN C) 4200-20-3 MG-MG-UNIT CAPS      estradiol  (ESTRACE ) 0.1 MG/GM vaginal cream 1 gram intravaginally at bedtime daily for 2-3 weeks, then decrease to 3 times per  week 42.5 g 12   levothyroxine  (SYNTHROID ) 88 MCG tablet Take 1 tablet (88 mcg total) by mouth daily. 90 tablet 3   Multiple Vitamins-Minerals (CENTRUM SILVER PO) Take 1 tablet by mouth daily.     nitrofurantoin , macrocrystal-monohydrate, (MACROBID ) 100 MG capsule Take one casule after intercourse 90 capsule 0   Probiotic Product (PROBIOTIC-10 ULTIMATE PO) Take by mouth.     simvastatin  (ZOCOR ) 40 MG tablet Take 1 tablet (40 mg total) by mouth at bedtime. 90 tablet 3   No current facility-administered medications on file prior to visit.   No Known Allergies Family History  Problem Relation Age of Onset   Breast cancer Mother 32   Cancer Mother    Varicose Veins Mother    Heart disease Father    Cancer Father        Prostate   Diabetes Father        diagnosed later in life   Prostate cancer Father    Colon polyps Father 38   Parkinson's disease Father    Congestive Heart Failure Father    Asthma Brother    ADD / ADHD Son    Colon cancer Neg Hx     Rectal cancer Neg Hx    Stomach cancer Neg Hx    Esophageal cancer Neg Hx    PE: BP 112/60   Pulse 71   Ht 5' 5 (1.651 m)   Wt 148 lb 9.6 oz (67.4 kg)   SpO2 96%   BMI 24.73 kg/m  Wt Readings from Last 15 Encounters:  05/05/24 148 lb 9.6 oz (67.4 kg)  03/17/24 145 lb (65.8 kg)  02/03/24 144 lb (65.3 kg)  12/23/23 147 lb 4 oz (66.8 kg)  10/23/23 150 lb 6 oz (68.2 kg)  07/31/23 151 lb 6.4 oz (68.7 kg)  05/06/23 151 lb 12.8 oz (68.9 kg)  01/29/23 145 lb (65.8 kg)  11/28/22 149 lb (67.6 kg)  11/28/22 145 lb (65.8 kg)  10/31/22 145 lb (65.8 kg)  05/07/22 146 lb 6.4 oz (66.4 kg)  03/21/22 145 lb (65.8 kg)  11/28/21 140 lb (63.5 kg)  11/17/21 145 lb (65.8 kg)   Constitutional: normal weight, in NAD Eyes:  EOMI, no exophthalmos ENT: no thyromegaly, no cervical lymphadenopathy Cardiovascular: RRR, No MRG Respiratory: CTA B Musculoskeletal: no deformities Skin:no rashes Neurological: no tremor with outstretched hands  ASSESSMENT: 1. MNG  2. Hypothyroidism  PLAN: 1.  Multinodular goiter - No neck compression symptoms -Thyroid  ultrasound report from 2021 showed that the nodules were stable or smaller than before.  The dominant nodule measured 2.4 x 2.2 x 1.5 cm (previously 2.9 cm in the largest dimension) and was isoechoic, with mild to moderate internal blood flow, more wider than tall.  The nodule was biopsied and the biopsy returned inconclusive (FLUS x 2), however, Afirma molecular marker returned benign, so the risk of cancer is very low.  We repeated a thyroid  ultrasound on 05/09/2023 and the nodules appeared to be stable. - Will continue to follow her expectantly for now - I will see her back in 1 year  2. Hypothyroidism - latest thyroid  labs reviewed with pt. >> normal: Lab Results  Component Value Date   TSH 3.32 05/06/2023  - she continues on LT4 88 mcg daily - pt feels good on this dose.  Before last visit, she gained 5 pounds in the setting of decreased  exercise and relaxed diet.  She lost a net 3 lbs since then. - we discussed about  taking the thyroid  hormone every day, with water, >30 minutes before breakfast, separated by >4 hours from acid reflux medications, calcium, iron, multivitamins. Pt. is taking it correctly. - will check thyroid  tests today: TSH and fT4 - If labs are abnormal, she will need to return for repeat TFTs in 1.5 months - OTW, I will see her back in a year  Needs refills.  Orders Placed This Encounter  Procedures   TSH   T4, free   Lela Fendt, MD PhD [facility] Endocrinology

## 2024-05-05 NOTE — Patient Instructions (Signed)
Please continue: - Levothyroxine 88 mcg daily.  Take the thyroid hormone every day, with water, at least 30 minutes before breakfast, separated by at least 4 hours from: - acid reflux medications - calcium - iron - multivitamins  Please stop at the lab.  Please return in 1 year.

## 2024-05-06 ENCOUNTER — Ambulatory Visit: Payer: Self-pay | Admitting: Internal Medicine

## 2024-05-06 LAB — TSH: TSH: 3.05 m[IU]/L (ref 0.40–4.50)

## 2024-05-06 LAB — T4, FREE: Free T4: 1.5 ng/dL (ref 0.8–1.8)

## 2024-05-06 MED ORDER — LEVOTHYROXINE SODIUM 88 MCG PO TABS: 88.0000 ug | ORAL_TABLET | Freq: Every day | ORAL | 3 refills | Status: AC

## 2024-05-06 NOTE — Addendum Note (Signed)
 Addended by: TRIXIE FILE on: 05/06/2024 08:25 AM   Modules accepted: Orders

## 2024-06-01 ENCOUNTER — Telehealth: Admitting: Physician Assistant

## 2024-06-01 ENCOUNTER — Telehealth: Payer: Self-pay | Admitting: Urology

## 2024-06-01 DIAGNOSIS — N3000 Acute cystitis without hematuria: Secondary | ICD-10-CM

## 2024-06-01 MED ORDER — CIPROFLOXACIN HCL 250 MG PO TABS: 250.0000 mg | ORAL_TABLET | Freq: Two times a day (BID) | ORAL | 0 refills | Status: AC

## 2024-06-01 NOTE — Telephone Encounter (Signed)
 I just received notification that  Shannon [name] had an e-visit today for another urinary tract infection.  She was prescribed another antibiotic, so I will need to see her in about 4 weeks.  Her last urinary tract infection with us  back in August grew out a bacteria what was resistant to some antibiotics.  We need to make sure that this current episode has been adequately treated

## 2024-06-01 NOTE — Progress Notes (Signed)

## 2024-06-13 ENCOUNTER — Telehealth: Admitting: Family Medicine

## 2024-06-13 DIAGNOSIS — N390 Urinary tract infection, site not specified: Secondary | ICD-10-CM | POA: Diagnosis not present

## 2024-06-13 MED ORDER — CEPHALEXIN 500 MG PO CAPS: 500.0000 mg | ORAL_CAPSULE | Freq: Two times a day (BID) | ORAL | 0 refills | Status: AC

## 2024-06-13 NOTE — Progress Notes (Signed)
 Virtual Visit Consent   Shannon [name], you are scheduled for a virtual visit with a [facility] provider today. Just as with appointments in the office, your consent must be obtained to participate. Your consent will be active for this visit and any virtual visit you may have with one of our providers in the next 365 days. If you have a MyChart account, a copy of this consent can be sent to you electronically.  As this is a virtual visit, video technology does not allow for your provider to perform a traditional examination. This may limit your provider's ability to fully assess your condition. If your provider identifies any concerns that need to be evaluated in person or the need to arrange testing (such as labs, EKG, etc.), we will make arrangements to do so. Although advances in technology are sophisticated, we cannot ensure that it will always work on either your end or our end. If the connection with a video visit is poor, the visit may have to be switched to a telephone visit. With either a video or telephone visit, we are not always able to ensure that we have a secure connection.  By engaging in this virtual visit, you consent to the provision of healthcare and authorize for your insurance to be billed (if applicable) for the services provided during this visit. Depending on your insurance coverage, you may receive a charge related to this service.  I need to obtain your verbal consent now. Are you willing to proceed with your visit today? Shannon [name] has provided verbal consent on 06/13/2024 for a virtual visit (video or telephone). Shannon [name], [place]  Date: 06/13/2024 9:35 AM   Virtual Visit via Video Note   I, Shannon [name], connected with  Shannon [name]  ([id number], Aug 27, 1950) on 06/13/24 at  9:30 AM EST by a video-enabled telemedicine application and verified that I am speaking with the correct person using two identifiers.  Location: Patient: Virtual Visit Location Patient:  Home Provider: Virtual Visit Location Provider: Home Office   I discussed the limitations of evaluation and management by telemedicine and the availability of in person appointments. The patient expressed understanding and agreed to proceed.    History of Present Illness: Shannon [name] is a 73 y.o. who identifies as a female who was assigned female at birth, and is being seen today for c/o she was treated for a UTI and took Cipro  for 5 days and it has been a couple of days and she woke up this morning and she still has it.  Pt states she did not have a culture but based on her history they gave her the medication for it. Pt states she has burning with urination.  Pt states she has appointment with urology December 9th. Pt denies fever, chills,N/V.   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/2023   Postnasal drip 07/31/2023   LFTs abnormal 11/10/2021   Toe deformity 11/10/2021   Cystitis 07/31/2021   Stress incontinence 11/08/2020   Stress 11/08/2020   Skin exam, screening for cancer 11/08/2020   Erythematous rash 10/15/2018   Ganglion cyst of finger 09/24/2018   Eczema of scalp 04/03/2017   Hair loss 06/02/2014   Routine general medical examination at a health care facility 11/24/2013  Hyperlipidemia 05/20/2013   Hypothyroidism 05/20/2013   Osteoporosis 05/20/2013   Thyroid  nodule 05/20/2013   Carpal tunnel syndrome 02/10/2013   Elevated TSH 02/10/2013    Allergies: No Known Allergies Medications:  Current Outpatient Medications:    cephALEXin  (KEFLEX ) 500 MG capsule, Take 1 capsule (500 mg total) by mouth 2 (two) times daily for 7 days., Disp: 14 capsule, Rfl: 0   b complex vitamins capsule, Take by mouth., Disp: , Rfl:    Calcium Carb-Cholecalciferol  600-10 MG-MCG TABS, , Disp: , Rfl:    Cholecalciferol (D3 HIGH POTENCY) 125 MCG (5000 UT) capsule, , Disp: , Rfl:    Cranberry-Vitamin C-Vitamin E (CRANBERRY PLUS VITAMIN C) 4200-20-3 MG-MG-UNIT CAPS, , Disp: , Rfl:    estradiol  (ESTRACE ) 0.1 MG/GM vaginal cream, 1 gram intravaginally at bedtime daily for 2-3 weeks, then decrease to 3 times per week, Disp: 42.5 g, Rfl: 12   levothyroxine  (SYNTHROID ) 88 MCG tablet, Take 1 tablet (88 mcg total) by mouth daily., Disp: 90 tablet, Rfl: 3   Multiple Vitamins-Minerals (CENTRUM SILVER PO), Take 1 tablet by mouth daily., Disp: , Rfl:    nitrofurantoin , macrocrystal-monohydrate, (MACROBID ) 100 MG capsule, Take one casule after intercourse, Disp: 90 capsule, Rfl: 0   Probiotic Product (PROBIOTIC-10 ULTIMATE PO), Take by mouth., Disp: , Rfl:    simvastatin  (ZOCOR ) 40 MG tablet, Take 1 tablet (40 mg total) by mouth at bedtime., Disp: 90 tablet, Rfl: 3  Observations/Objective: Patient is well-developed, well-nourished in no acute distress.  Resting comfortably at home.  Head is normocephalic, atraumatic.  No labored breathing.  Speech is clear and coherent with logical content.  Patient is alert and oriented at baseline.    Assessment and Plan: 1. Recurrent UTI (Primary) - cephALEXin  (KEFLEX ) 500 MG capsule; Take 1 capsule (500 mg total) by mouth 2 (two) times daily for 7 days.  Dispense: 14 capsule; Refill: 0  -Advised Pt if continued symptoms to proceed to urgent care in person to have urine cultured   Follow Up Instructions: I discussed the assessment and treatment plan with the patient. The patient was provided an opportunity to ask questions and all were answered. The patient agreed with the plan and demonstrated an understanding of the instructions.  A copy of instructions were sent to the patient via MyChart unless otherwise noted below.    The patient was advised to call back or seek an in-person evaluation if the symptoms worsen or if the  condition fails to improve as anticipated.    Shannon Mater, PA-C

## 2024-06-13 NOTE — Patient Instructions (Signed)
 Shannon [name], thank you for joining Roosvelt Mater, PA-C for today's virtual visit.  While this provider is not your primary care provider (PCP), if your PCP is located in our provider database this encounter information will be shared with them immediately following your visit.   A [facility] MyChart account gives you access to today's visit and all your visits, tests, and labs performed at [facility]  click here if you don't have a [facility] MyChart account or go to [email or web address]  Consent: (Patient) Shannon [name] provided verbal consent for this virtual visit at the beginning of the encounter.  Current Medications:  Current Outpatient Medications:    cephALEXin  (KEFLEX ) 500 MG capsule, Take 1 capsule (500 mg total) by mouth 2 (two) times daily for 7 days., Disp: 14 capsule, Rfl: 0   b complex vitamins capsule, Take by mouth., Disp: , Rfl:    Calcium Carb-Cholecalciferol 600-10 MG-MCG TABS, , Disp: , Rfl:    Cholecalciferol (D3 HIGH POTENCY) 125 MCG (5000 UT) capsule, , Disp: , Rfl:    Cranberry-Vitamin C-Vitamin E (CRANBERRY PLUS VITAMIN C) 4200-20-3 MG-MG-UNIT CAPS, , Disp: , Rfl:    estradiol  (ESTRACE ) 0.1 MG/GM vaginal cream, 1 gram intravaginally at bedtime daily for 2-3 weeks, then decrease to 3 times per week, Disp: 42.5 g, Rfl: 12   levothyroxine  (SYNTHROID ) 88 MCG tablet, Take 1 tablet (88 mcg total) by mouth daily., Disp: 90 tablet, Rfl: 3   Multiple Vitamins-Minerals (CENTRUM SILVER PO), Take 1 tablet by mouth daily., Disp: , Rfl:    nitrofurantoin , macrocrystal-monohydrate, (MACROBID ) 100 MG capsule, Take one casule after intercourse, Disp: 90 capsule, Rfl: 0   Probiotic Product (PROBIOTIC-10 ULTIMATE PO), Take by mouth., Disp: , Rfl:    simvastatin  (ZOCOR ) 40 MG tablet, Take 1 tablet (40 mg total) by mouth at bedtime., Disp: 90 tablet, Rfl: 3   Medications ordered in this encounter:  Meds ordered this encounter  Medications   cephALEXin   (KEFLEX ) 500 MG capsule    Sig: Take 1 capsule (500 mg total) by mouth 2 (two) times daily for 7 days.    Dispense:  14 capsule    Refill:  0     *If you need refills on other medications prior to your next appointment, please contact your pharmacy*  Follow-Up: Call back or seek an in-person evaluation if the symptoms worsen or if the condition fails to improve as anticipated.  [facility] [phone]  Other Instructions Urinary Tract Infection, Female A urinary tract infection (UTI) is an infection in your urinary tract. The urinary tract is made up of organs that make, store, and get rid of pee (urine) in your body. These organs include: The kidneys. The ureters. The bladder. The urethra. What are the causes? Most UTIs are caused by germs called bacteria. They may be in or near your genitals. These germs grow and cause swelling in your urinary tract. What increases the risk? You're more likely to get a UTI if: You're a female. The urethra is shorter in females than in males. You have a soft tube called a catheter that drains your pee. You can't control when you pee or poop. You have trouble peeing because of: A kidney stone. A urinary blockage. A nerve condition that affects your bladder. Not getting enough to drink. You're sexually active. You use a birth control inside your vagina, like spermicide. You're pregnant. You have low levels of the hormone estrogen in your body. You're an older adult.  You're also more likely to get a UTI if you have other health problems. These may include: Diabetes. A weak immune system. Your immune system is your body's defense system. Sickle cell disease. Injury of the spine. What are the signs or symptoms? Symptoms may include: Needing to pee right away. Peeing small amounts often. Pain or burning when you pee. Blood in your pee. Pee that smells bad or odd. Pain in your belly or lower back. You may also: Feel  confused. This may be the first symptom in older adults. Vomit. Not feel hungry. Feel tired or easily annoyed. Have a fever or chills. How is this diagnosed? A UTI is diagnosed based on your medical history and an exam. You may also have other tests. These may include: Pee tests. Blood tests. Tests for sexually transmitted infections (STIs). If you've had more than one UTI, you may need to have imaging studies done to find out why you keep getting them. How is this treated? A UTI can be treated by: Taking antibiotics or other medicines. Drinking enough fluid to keep your pee pale yellow. In rare cases, a UTI can cause a very bad condition called sepsis. Sepsis may be treated in the hospital. Follow these instructions at home: Medicines Take your medicines only as told by your health care provider. If you were given antibiotics, take them as told by your provider. Do not stop taking them even if you start to feel better. General instructions Make sure you: Pee often and fully. Do not hold your pee for a long time. Wipe from front to back after you pee or poop. Use each tissue only once when you wipe. Pee after you have sex. Do not douche or use sprays or powders in your genital area. Contact a health care provider if: Your symptoms don't get better after 1-2 days of taking antibiotics. Your symptoms go away and then come back. You have a fever or chills. You vomit or feel like you may vomit. Get help right away if: You have very bad pain in your back or lower belly. You faint. This information is not intended to replace advice given to you by your health care provider. Make sure you discuss any questions you have with your health care provider. Document Revised: 06/19/2023 Document Reviewed: 10/12/2022 Elsevier Patient Education  [employer].   If you have been instructed to have an in-person evaluation today at a local Urgent Care facility, please use the link below. It  will take you to a list of all of our available [facility], including address, phone number and hours of operation. Please do not delay care.  [facility]  If you or a family member do not have a primary care provider, use the link below to schedule a visit and establish care. When you choose a [facility] primary care physician or advanced practice provider, you gain a long-term partner in health. Find a Primary Care Provider  Learn more about [facility]'s in-office and virtual care options: [facility] - Get Care Now

## 2024-06-26 ENCOUNTER — Ambulatory Visit

## 2024-06-26 VITALS — BP 100/60 | HR 68 | Temp 98.5°F | Ht 65.0 in | Wt 150.4 lb

## 2024-06-26 DIAGNOSIS — E039 Hypothyroidism, unspecified: Secondary | ICD-10-CM | POA: Diagnosis not present

## 2024-06-26 DIAGNOSIS — R7309 Other abnormal glucose: Secondary | ICD-10-CM | POA: Diagnosis not present

## 2024-06-26 DIAGNOSIS — E785 Hyperlipidemia, unspecified: Secondary | ICD-10-CM

## 2024-06-26 DIAGNOSIS — I8393 Asymptomatic varicose veins of bilateral lower extremities: Secondary | ICD-10-CM | POA: Insufficient documentation

## 2024-06-26 DIAGNOSIS — R21 Rash and other nonspecific skin eruption: Secondary | ICD-10-CM

## 2024-06-26 DIAGNOSIS — M6208 Separation of muscle (nontraumatic), other site: Secondary | ICD-10-CM | POA: Insufficient documentation

## 2024-06-26 DIAGNOSIS — E782 Mixed hyperlipidemia: Secondary | ICD-10-CM | POA: Diagnosis not present

## 2024-06-26 DIAGNOSIS — R7689 Other specified abnormal immunological findings in serum: Secondary | ICD-10-CM | POA: Insufficient documentation

## 2024-06-26 DIAGNOSIS — Z780 Asymptomatic menopausal state: Secondary | ICD-10-CM | POA: Insufficient documentation

## 2024-06-26 LAB — COMPREHENSIVE METABOLIC PANEL WITH GFR
ALT: 36 U/L — ABNORMAL HIGH (ref 0–35)
AST: 37 U/L (ref 0–37)
Albumin: 4.5 g/dL (ref 3.5–5.2)
Alkaline Phosphatase: 37 U/L — ABNORMAL LOW (ref 39–117)
BUN: 21 mg/dL (ref 6–23)
CO2: 27 meq/L (ref 19–32)
Calcium: 9.6 mg/dL (ref 8.4–10.5)
Chloride: 101 meq/L (ref 96–112)
Creatinine, Ser: 0.73 mg/dL (ref 0.40–1.20)
GFR: 81.61 mL/min (ref >60.00)
Glucose, Bld: 88 mg/dL (ref 70–99)
Potassium: 4.3 meq/L (ref 3.5–5.1)
Sodium: 135 meq/L (ref 135–145)
Total Bilirubin: 0.9 mg/dL (ref 0.2–1.2)
Total Protein: 7 g/dL (ref 6.0–8.3)

## 2024-06-26 LAB — LIPID PANEL
Cholesterol: 149 mg/dL (ref 0–200)
HDL: 62.9 mg/dL (ref >39.00)
LDL Cholesterol: 62 mg/dL (ref 0–99)
NonHDL: 86.58
Total CHOL/HDL Ratio: 2
Triglycerides: 125 mg/dL (ref 0.0–149.0)
VLDL: 25 mg/dL (ref 0.0–40.0)

## 2024-06-26 LAB — HEMOGLOBIN A1C: Hgb A1c MFr Bld: 5.5 % (ref 4.6–6.5)

## 2024-06-26 MED ORDER — ROSUVASTATIN CALCIUM 10 MG PO TABS: 10.0000 mg | ORAL_TABLET | Freq: Every day | ORAL | 3 refills | Status: AC

## 2024-06-26 MED ORDER — ESTRADIOL 0.01 % VA CREA: 1.0000 | TOPICAL_CREAM | VAGINAL | 12 refills | Status: AC

## 2024-06-26 NOTE — Patient Instructions (Addendum)
-   Start Rosuvastatin  10 mg at night time, please stop Simvastatin .  - I am putting a physical therapy referral to work on abdominal muscle. Please reach out to us  if you don't hear from scheduling in couple of weeks.  - Please get lab only appointment in 3 months to get repeat liver function test and 6 months follow up for annual physical.

## 2024-06-26 NOTE — Assessment & Plan Note (Signed)
 Discussed this is nonspecific. Patient asymptomatic. Previous facial rash resolved. Will hold off on rheumatology evaluation at this time.

## 2024-06-26 NOTE — Progress Notes (Signed)
 Established Patient Office Visit TOC from Dr. Owens. [place]    Subjective  Patient ID: Shannon [name], female    DOB: 07/17/1951  Age: 73 y.o. MRN: [id number]  Chief Complaint  Patient presents with   Establish Care    Discussed the use of AI scribe software for clinical note transcription with the patient, who gave verbal consent to proceed.  History of Present Illness Shannon [name] is a 73 year old female who presents for transfer of care from previous PCP and chronic medication follow up.   1) Recurrent UTI  2) Postmenopausal vaginal symptoms: - No urinary symptoms today.  Urology visit upcoming with Clotilda Cornwall on 06/30/24 She has Nitrofurantoin  100 mg prn prescription for post-coital to reduce risk of recurrent UTI.  - She also uses a basal estrogen cream intermittently, avoiding it during infections due to fear of irritation. She experiences urinary leakage and typically wears a panty liner. She reports frequency of urine vaginal estrogen cream can be improved as she is not always consistent with it's use.   2) Positive ANA titer, normal ESR, CRP  10/25/23 was referred to rheumatology after she developed facial rash in the past. However, this has since resolved and she has canceled rheumatology appointment. She does not have family history significant for autoimmune disease. She has aches and pain on her knees and was told she has arthritis when she was seen by [facility]. She does not have new concerns.    3) Hyperlipidemia: On Simvastatin  40 mg at nighttime. Tolerating well.   4) Hypothyroidism: Endo Dr. Trixie on Levothyroxine  taking it in AM  5) Diastasis recti, identified during pelvic floor physical therapy in the past. She does not report any pain associated with this condition but mentions a concern from her partner about a potential hernia, which was not confirmed. No abdominal pain is reported.   6) Her social history includes working two part-time  jobs, one at [employer] and another at [employer]. 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 She walks regularly, using a treadmill three times a week, and is on her feet frequently due to her job. She has a family history of longevity, with her parents living into their mid-54s.    ROS As per HPI    Objective:     BP 100/60 (BP Location: Right Arm, Patient Position: Sitting)   Pulse 68   Temp 98.5 F (36.9 C) (Oral)   Ht 5' 5 (1.651 m)   Wt 150 lb 6.4 oz (68.2 kg)   SpO2 95%   BMI 25.03 kg/m       06/26/2024    8:16 AM 02/03/2024   10:15 AM 12/23/2023    9:39 AM  Depression screen PHQ 2/9  Decreased Interest 0 0 0  Down, Depressed, Hopeless 0 0 0  PHQ - 2 Score 0 0 0  Altered sleeping 1 0 0  Tired, decreased energy 0 0 0  Change in appetite 0  0  Feeling bad or failure about yourself  0  0  Trouble concentrating 0 0 0  Moving slowly or fidgety/restless 0 0 0  Suicidal thoughts 0 0 0  PHQ-9 Score 1 0  0   Difficult doing work/chores Not difficult at all Not difficult at all Not difficult at all     Data saved with a previous flowsheet row definition      06/26/2024    8:16 AM  12/23/2023    9:39 AM 10/23/2023    8:58 AM 11/28/2022    1:49 PM  GAD 7 : Generalized Anxiety Score  Nervous, Anxious, on Edge 0 0 1 0  Control/stop worrying 0 0 0 0  Worry too much - different things 0 0 0 0  Trouble relaxing 0 0 0 0  Restless 0 0 0 0  Easily annoyed or irritable 1 0 0 0  Afraid - awful might happen 0 0 0 0  Total GAD 7 Score 1 0 1 0  Anxiety Difficulty Not difficult at all Not difficult at all Not difficult at all Not difficult at all      06/26/2024    8:16 AM 02/03/2024   10:15 AM 12/23/2023    9:39 AM  Depression screen PHQ 2/9  Decreased Interest 0 0 0  Down, Depressed, Hopeless 0 0 0  PHQ - 2 Score 0 0 0  Altered sleeping 1 0 0  Tired, decreased energy 0 0 0  Change in appetite 0  0  Feeling bad or failure about yourself   0  0  Trouble concentrating 0 0 0  Moving slowly or fidgety/restless 0 0 0  Suicidal thoughts 0 0 0  PHQ-9 Score 1 0  0   Difficult doing work/chores Not difficult at all Not difficult at all Not difficult at all     Data saved with a previous flowsheet row definition      06/26/2024    8:16 AM 12/23/2023    9:39 AM 10/23/2023    8:58 AM 11/28/2022    1:49 PM  GAD 7 : Generalized Anxiety Score  Nervous, Anxious, on Edge 0 0 1 0  Control/stop worrying 0 0 0 0  Worry too much - different things 0 0 0 0  Trouble relaxing 0 0 0 0  Restless 0 0 0 0  Easily annoyed or irritable 1 0 0 0  Afraid - awful might happen 0 0 0 0  Total GAD 7 Score 1 0 1 0  Anxiety Difficulty Not difficult at all Not difficult at all Not difficult at all Not difficult at all   SDOH Screenings   Food Insecurity: No Food Insecurity (06/24/2024)  Housing: Low Risk  (06/24/2024)  Transportation Needs: No Transportation Needs (06/24/2024)  Utilities: Not At Risk (02/03/2024)  Alcohol Screen: Low Risk  (06/24/2024)  Depression (PHQ2-9): Low Risk  (06/26/2024)  Financial Resource Strain: Low Risk  (06/24/2024)  Physical Activity: Insufficiently Active (06/24/2024)  Social Connections: Moderately Integrated (06/24/2024)  Stress: No Stress Concern Present (06/24/2024)  Tobacco Use: Medium Risk (06/26/2024)  Health Literacy: Adequate Health Literacy (02/03/2024)     Physical Exam Constitutional:      General: She is not in acute distress.    Appearance: Normal appearance.  HENT:     Head: Normocephalic and atraumatic.     Right Ear: Tympanic membrane normal. There is no impacted cerumen.     Left Ear: Tympanic membrane normal. There is no impacted cerumen.     Mouth/Throat:     Mouth: Mucous membranes are moist.  Neck:     Thyroid : No thyroid  mass or thyroid  tenderness.  Cardiovascular:     Rate and Rhythm: Normal rate and regular rhythm.  Pulmonary:     Effort: Pulmonary effort is normal.     Breath sounds: Normal  breath sounds. No wheezing.  Abdominal:     General: Bowel sounds are normal.     Palpations: Abdomen  is soft.     Tenderness: There is no abdominal tenderness.     Comments: Abdomen: palpable separation between the rectus abdominis muscles above umbilicus, protrusion more visible when curling up  Musculoskeletal:     Cervical back: Neck supple. No rigidity or tenderness.     Right lower leg: No edema.     Left lower leg: No edema.     Comments: Bilateral varicose veins without erythema, swelling   Skin:    General: Skin is warm.  Neurological:     Mental Status: She is alert and oriented to person, place, and time.     Motor: No weakness.  Psychiatric:        Mood and Affect: Mood normal.        Behavior: Behavior normal.        No results found for any visits on 06/26/24.  The 10-year ASCVD risk score (Arnett DK, et al., 2019) is: 8.1%     Assessment & Plan:  Patient is a very pleasant 73 year old female presenting for transfer of care visit from previous PCP.  Assessment & Plan Mixed hyperlipidemia Managed with simvastatin  40 mg. Discussed switching to rosuvastatin  10 mg for better cardiovascular benefits. Explained potential side effects and advised to stop if she occurs. Discontinued simvastatin . Initiated rosuvastatin  10 mg at bedtime. Check fasting lipid panel, CMP today and repeat hepatic function in 3 months.  Orders:   Lipid panel   Comprehensive metabolic panel with GFR   rosuvastatin  (CRESTOR ) 10 MG tablet; Take 1 tablet (10 mg total) by mouth daily.   Hepatic function panel; Future  Abnormal glucose Check A1c.  Orders:   Hemoglobin A1c  Diastasis recti Patient asymptomatic. Discussed conservative management and physical therapy for core strengthening exercises. PT referral made.  Monitor for any development of pain or significant symptoms. Consider general surgery referral in the future.  Orders:   Ambulatory referral to Physical Therapy  Acquired  hypothyroidism She follows up with endocrinologist for this (Dr. Trixie) and takes Levothyroxine  88 mcg in the morning. TSH has been stable, continue f/u with endocrinologist.      Postmenopausal estrogen deficiency With recurrent UTI.  Discussed estrogen's role in vaginal health and reducing urinary symptoms. Emphasized consistent estrogen cream use to reduce symptoms and antibiotic resistance risk. Continue basal estrogen cream twice weekly at night.  Advised use of 100% cotton panty liners and underwear. Continue close follow up urology.  Orders:   estradiol  (ESTRACE ) 0.01 % CREA vaginal cream; Place 1 Applicatorful vaginally 2 (two) times a week. Twice weekly at bedtime.  Positive ANA (antinuclear antibody) Discussed this is nonspecific. Patient asymptomatic. Previous facial rash resolved. Will hold off on rheumatology evaluation at this time.     Asymptomatic varicose veins of both lower extremities Advised use of compression socks if symptoms develop. Encouraged leg elevation and regular walking.    I personally spent a total of 45 minutes in the care of the patient today including preparing to see the patient, getting/reviewing separately obtained history, performing a medically appropriate exam/evaluation, counseling and educating, placing orders, referring and communicating with other health care professionals, documenting clinical information in the EHR, independently interpreting results, and communicating results.  Return for 3 M nonfasting lab, 37M f/u with Dr. Abbey for annual physical .   Luke Abbey, MD

## 2024-06-26 NOTE — Assessment & Plan Note (Signed)
 Advised use of compression socks if symptoms develop. Encouraged leg elevation and regular walking.

## 2024-06-26 NOTE — Assessment & Plan Note (Signed)
 Patient asymptomatic. Discussed conservative management and physical therapy for core strengthening exercises. PT referral made.  Monitor for any development of pain or significant symptoms. Consider general surgery referral in the future.  Orders:   Ambulatory referral to Physical Therapy

## 2024-06-26 NOTE — Assessment & Plan Note (Deleted)
 SABRA

## 2024-06-26 NOTE — Assessment & Plan Note (Addendum)
 She follows up with endocrinologist for this (Dr. Trixie) and takes Levothyroxine  88 mcg in the morning. TSH has been stable, continue f/u with endocrinologist.

## 2024-06-26 NOTE — Assessment & Plan Note (Addendum)
 Check A1c  Orders:    Hemoglobin A1c

## 2024-06-26 NOTE — Assessment & Plan Note (Addendum)
 Managed with simvastatin  40 mg. Discussed switching to rosuvastatin  10 mg for better cardiovascular benefits. Explained potential side effects and advised to stop if she occurs. Discontinued simvastatin . Initiated rosuvastatin  10 mg at bedtime. Check fasting lipid panel, CMP today and repeat hepatic function in 3 months.  Orders:   Lipid panel   Comprehensive metabolic panel with GFR   rosuvastatin  (CRESTOR ) 10 MG tablet; Take 1 tablet (10 mg total) by mouth daily.   Hepatic function panel; Future

## 2024-06-26 NOTE — Assessment & Plan Note (Signed)
 With recurrent UTI.  Discussed estrogen's role in vaginal health and reducing urinary symptoms. Emphasized consistent estrogen cream use to reduce symptoms and antibiotic resistance risk. Continue basal estrogen cream twice weekly at night.  Advised use of 100% cotton panty liners and underwear. Continue close follow up urology.  Orders:   estradiol  (ESTRACE ) 0.01 % CREA vaginal cream; Place 1 Applicatorful vaginally 2 (two) times a week. Twice weekly at bedtime.

## 2024-06-27 NOTE — Progress Notes (Unsigned)
 06/30/2024 8:46 PM   Shannon [name] 06-04-1951 [id number]  Referring provider: Abbey Bruckner, MD [address] [place],  [place] [address]  Urological history: 1. SUI - seen PT in the past  2. rUTI's - E. Coli Nov 21, 2023 - Less than 10,000 colonies October 23, 2023 - E. Coli October 03, 2023 - Vaginal estrogen cream, cranberry tablets and probiotics  3.  High risk hematuria - former smoker - CTU (10/2022) - benign cysts - cysto (11/2022) -urethral caruncle  Chief Complaint  Patient presents with   Recurrent UTI   HPI: Shannon [name] is a 73 y.o. woman who presents today for possible UTI.  Previous records reviewed.   She is having 1-7 daytime voids, 1-2 episodes of nocturia with a mild urge to urinate.  She has stress and urge incontinence.  She is leaking 1-2 times daily.  She wears 1 panty liner daily.  She does not limit fluid intake.  She does not engage in toilet mapping.  Patient denies any modifying or aggravating factors.  Patient denies any recent UTI's, gross hematuria, dysuria or suprapubic/flank pain.  Patient denies any fevers, chills, nausea or vomiting.  In November she had symptoms of UTI, but she did a telehealth visit and was placed on Cipro .  Once the Cipro  did not relieve her symptoms she was switched to Keflex  and that did relieve her symptoms.  She has been having to lean forward to empty her bladder.  She also states she has noticed a weak urinary stream.  She does use vaginal estrogen cream, but she does not use it when she has an infection as she feels that that will make it worse.  She is concerned that she is continuing to have rUTI's and she does not want to develop  resistant organisms.      UA yellow clear, specific gravity 1.025, pH 6.0, trace heme, trace leukocytes, 6-10 WBCs, 3-10 RBCs, mucus threads present and a few bacteria.  Serum creatinine (06/2024) 0.73  Hemoglobin A1c (06/2024) 5.5  PMH: Past Medical History:  Diagnosis Date    GERD (gastroesophageal reflux disease)    Hyperlipidemia    Osteoporosis    now has osteopenia   Thyroid  disease     Surgical History: Past Surgical History:  Procedure Laterality Date   HAND SURGERY     40 yrs ago    Home Medications:  Allergies as of 06/30/2024   No Known Allergies      Medication List        Accurate as of June 30, 2024  8:46 PM. If you have any questions, ask your nurse or doctor.          b complex vitamins capsule Take by mouth.   Calcium  Carb-Cholecalciferol 600-10 MG-MCG Tabs   CENTRUM SILVER PO Take 1 tablet by mouth daily.   cephALEXin  500 MG capsule Commonly known as: KEFLEX  Take 1 capsule (500 mg total) by mouth 2 (two) times daily for 5 days.   Cranberry Plus Vitamin C 4200-20-3 MG-MG-UNIT Caps Generic drug: Cranberry-Vitamin C-Vitamin E   D3 High Potency 125 MCG (5000 UT) capsule Generic drug: Cholecalciferol   estradiol  0.01 % Crea vaginal cream Commonly known as: ESTRACE  Place 1 Applicatorful vaginally 2 (two) times a week. Twice weekly at bedtime.   levothyroxine  88 MCG tablet Commonly known as: SYNTHROID  Take 1 tablet (88 mcg total) by mouth daily.   nitrofurantoin  (macrocrystal-monohydrate) 100 MG capsule Commonly known as: MACROBID  Take one casule after  intercourse   PROBIOTIC-10 ULTIMATE PO Take by mouth.   rosuvastatin  10 MG tablet Commonly known as: CRESTOR  Take 1 tablet (10 mg total) by mouth daily.        Allergies: No Known Allergies  Family History: Family History  Problem Relation Age of Onset   Breast cancer Mother 30   Cancer Mother    Varicose Veins Mother    Heart disease Father    Cancer Father        Prostate   Diabetes Father        diagnosed later in life   Prostate cancer Father    Colon polyps Father 56   Parkinson's disease Father    Congestive Heart Failure Father    Asthma Brother    ADD / ADHD Son    Colon cancer Neg Hx    Rectal cancer Neg Hx    Stomach cancer  Neg Hx    Esophageal cancer Neg Hx     Social History:  reports that she quit smoking about 35 years ago. Her smoking use included cigarettes. She has never used smokeless tobacco. She reports current alcohol use of about 10.0 standard drinks of alcohol per week. She reports that she does not use drugs.  ROS: Pertinent ROS in HPI  Physical Exam: BP 118/78 (BP Location: Left Arm, Patient Position: Sitting, Cuff Size: Normal)   Pulse 72   Wt 150 lb (68 kg)   SpO2 99%   BMI 24.96 kg/m   Constitutional:  Well nourished. Alert and oriented, No acute distress. HEENT:  AT, moist mucus membranes.  Trachea midline, no masses. Cardiovascular: No clubbing, cyanosis, or edema. Respiratory: Normal respiratory effort, no increased work of breathing. GU: No CVA tenderness.  No bladder fullness or masses.  Recession of labia minora, dry, pale vulvar vaginal mucosa and loss of mucosal ridges and folds.  The right external labia is not attached to the vulva.  It is like a string hanging that may be interrupting the urinary stream.  Normal urethral meatus, no lesions, no prolapse, no discharge.   There is an urethra caruncle noted.  No bladder fullness, tenderness or masses. Pale vagina mucosa, poor estrogen effect, no discharge, no lesions, fair pelvic support, no cystocele and no rectocele noted.  Anus and perineum are without rashes or lesions.     Neurologic: Grossly intact, no focal deficits, moving all 4 extremities. Psychiatric: Normal mood and affect.    Laboratory Data: See EPIC and HPI  I have reviewed the labs.   Pertinent Imaging: N/A  Assessment & Plan:    1. Recurrent UTI's - Asymptomatic at this visit - Urinalysis, Complete - clear - encouraged consistent use of the vaginal estrogen cream - discussed taking Hiprex, but she deferred at this time  - advised her that we need to establish a pattern of what her urine looks like microscopically and what her cultures grow out when she  is having UTI symptoms, so that we do not miss worrisome microscopic hematuria and to make sure her symptoms are the results of an infection - when she starts to experience symptoms, she will call the office to provide a specimen, I have given her a cup, so she can drop off a specimen - I did give her Keflex  500 mg BID x 5 days in case she develops an UTI over the Christmas Holiday  2. Vaginal atrophy - continue vaginal estrogen cream, but she will apply it every night for 30 days and then  return to three nights weekly  Return for Follow up pending labs.  These notes generated with voice recognition software. I apologize for typographical errors.  Shannon [name]  [facility] [address]  [address] [place], [place] [address] [phone]

## 2024-06-29 ENCOUNTER — Ambulatory Visit: Payer: Self-pay

## 2024-06-30 ENCOUNTER — Ambulatory Visit: Admitting: Urology

## 2024-06-30 ENCOUNTER — Encounter: Payer: Self-pay | Admitting: Urology

## 2024-06-30 VITALS — BP 118/78 | HR 72 | Wt 150.0 lb

## 2024-06-30 DIAGNOSIS — N390 Urinary tract infection, site not specified: Secondary | ICD-10-CM

## 2024-06-30 DIAGNOSIS — N952 Postmenopausal atrophic vaginitis: Secondary | ICD-10-CM

## 2024-06-30 MED ORDER — CEPHALEXIN 500 MG PO CAPS: 500.0000 mg | ORAL_CAPSULE | Freq: Two times a day (BID) | ORAL | 0 refills | Status: DC

## 2024-07-01 ENCOUNTER — Other Ambulatory Visit: Payer: Self-pay

## 2024-07-01 ENCOUNTER — Other Ambulatory Visit

## 2024-07-01 DIAGNOSIS — N390 Urinary tract infection, site not specified: Secondary | ICD-10-CM

## 2024-07-01 LAB — URINALYSIS, COMPLETE
Bilirubin, UA: NEGATIVE
Bilirubin, UA: NEGATIVE
Glucose, UA: NEGATIVE
Ketones, UA: NEGATIVE
Ketones, UA: NEGATIVE
Nitrite, UA: NEGATIVE
Nitrite, UA: POSITIVE — AB
Protein,UA: NEGATIVE
Specific Gravity, UA: 1.025 (ref 1.005–1.030)
Specific Gravity, UA: 1.015 (ref 1.005–1.030)
Urobilinogen, Ur: 0.2 mg/dL (ref 0.2–1.0)
Urobilinogen, Ur: 4 mg/dL — ABNORMAL HIGH (ref 0.2–1.0)
pH, UA: 6 (ref 5.0–7.5)
pH, UA: 6 (ref 5.0–7.5)

## 2024-07-01 LAB — MICROSCOPIC EXAMINATION: WBC, UA: >30 /HPF — AB (ref 0–5)

## 2024-07-01 NOTE — Telephone Encounter (Signed)
Called patient and patient understood

## 2024-07-03 ENCOUNTER — Telehealth: Payer: Self-pay

## 2024-07-03 DIAGNOSIS — N390 Urinary tract infection, site not specified: Secondary | ICD-10-CM

## 2024-07-03 MED ORDER — CEPHALEXIN 500 MG PO CAPS: 500.0000 mg | ORAL_CAPSULE | Freq: Two times a day (BID) | ORAL | 0 refills | Status: AC

## 2024-07-03 NOTE — Telephone Encounter (Signed)
 Advised patient that per CANDIE Cornwall PA, ok to take Keflex  that she has on hand and a refill for Keflex  will be sent to the [facility] pharmacy. Patient verbalized understanding of the information given.  Andrea Kirks LPN

## 2024-07-07 ENCOUNTER — Ambulatory Visit: Payer: Self-pay | Admitting: Urology

## 2024-07-07 LAB — CULTURE, URINE COMPREHENSIVE

## 2024-07-08 ENCOUNTER — Other Ambulatory Visit: Payer: Self-pay

## 2024-07-08 DIAGNOSIS — N390 Urinary tract infection, site not specified: Secondary | ICD-10-CM

## 2024-07-08 MED ORDER — NITROFURANTOIN MONOHYD MACRO 100 MG PO CAPS: 100.0000 mg | ORAL_CAPSULE | Freq: Two times a day (BID) | ORAL | 0 refills | Status: AC

## 2024-07-08 NOTE — Telephone Encounter (Signed)
 Spoke with patient in regards to provider advice below. Patient verbalized understanding to get a renal ultrasound after the holidays. The telephone number to scheduling was given to patient via mychart.  Andrea Kirks LPN

## 2024-07-08 NOTE — Telephone Encounter (Signed)
-----   Message from [facility] sent at 07/08/2024  1:58 PM EST ----- If she is not having anymore symptoms, I do not want her to take the Macrobid .  I would like for her to get a renal ultrasound after the holidays.  She has had a lot of frequent UTIs recently and the  last 1 was highly resistant.   ----- Message ----- From: Evy Andrea LABOR, LPN Sent: [id number]   1:36 PM EST To: Clotilda LABOR Cornwall, PA-C  I was able to reach Ms. Shannon [name] after an additional attempt. Patient states that she was and still is out of town. Patient reports that she is asymptomatic after completing the course of Keflex . Since  she was out of town she did not pick up the Macrobid . Patient would like to know if you still want her to start the Macrobid  even though she is not having any uti symptoms. She said if so the  pescription will have to be sent to a pharmacy in [place] .  Please Advise    Andrea Evy LPN  ----- Message ----- From: Cornwall Clotilda LABOR DEVONNA Sent: 07/07/2024   8:53 PM EST To: Andrea LABOR Evy, LPN  Would you let Mrs. Blades know that her urine culture grew out a tiny amount of bacteria which we typically would not treat, but since she is having symptoms, we need to have her stop the Kelfex and  start Macrobid  100 mg twice daily for seven days.  We then need to see her back in the office in two months for repeat UA and recheck on symptoms.

## 2024-07-08 NOTE — Telephone Encounter (Signed)
-----   Message from [facility] sent at 07/07/2024  8:53 PM EST ----- Would you let Shannon [name] know that her urine culture grew out a tiny amount of bacteria which we typically would not treat, but since she is having symptoms, we need to have her stop the Kelfex and  start Macrobid  100 mg twice daily for seven days.  We then need to see her back in the office in two months for repeat UA and recheck on symptoms.

## 2024-07-08 NOTE — Telephone Encounter (Signed)
 Attempted to contact patient in regards to results and medication change. Will try to reach back out to patient in regards to Abx change.  Andrea Kirks LPN

## 2024-07-14 ENCOUNTER — Ambulatory Visit
Admission: RE | Admit: 2024-07-14 | Discharge: 2024-07-14 | Disposition: A | Discharge status: Home / Self Care | Source: Ambulatory Visit | Attending: Urology | Admitting: Urology

## 2024-07-14 DIAGNOSIS — N390 Urinary tract infection, site not specified: Secondary | ICD-10-CM | POA: Insufficient documentation

## 2024-07-30 ENCOUNTER — Ambulatory Visit: Payer: Self-pay | Admitting: Urology

## 2024-07-31 ENCOUNTER — Encounter

## 2024-09-24 ENCOUNTER — Other Ambulatory Visit

## 2024-09-25 ENCOUNTER — Ambulatory Visit: Admitting: Urology

## 2024-12-25 ENCOUNTER — Encounter

## 2025-02-08 ENCOUNTER — Ambulatory Visit

## 2025-05-05 ENCOUNTER — Ambulatory Visit: Admitting: Internal Medicine
